# Patient Record
Sex: Female | Born: 1950 | Race: White | Hispanic: No | Marital: Single
Health system: Southern US, Community
[De-identification: ages and names within clinical notes are randomized; demographics above are authoritative.]

## PROBLEM LIST (undated history)

## (undated) DIAGNOSIS — F32A Depression, unspecified: Secondary | ICD-10-CM

## (undated) DIAGNOSIS — Z9889 Other specified postprocedural states: Secondary | ICD-10-CM

## (undated) DIAGNOSIS — Z86718 Personal history of other venous thrombosis and embolism: Secondary | ICD-10-CM

## (undated) DIAGNOSIS — T8859XA Other complications of anesthesia, initial encounter: Secondary | ICD-10-CM

## (undated) DIAGNOSIS — M1990 Unspecified osteoarthritis, unspecified site: Secondary | ICD-10-CM

## (undated) DIAGNOSIS — F419 Anxiety disorder, unspecified: Secondary | ICD-10-CM

## (undated) DIAGNOSIS — C4490 Unspecified malignant neoplasm of skin, unspecified: Secondary | ICD-10-CM

## (undated) DIAGNOSIS — R112 Nausea with vomiting, unspecified: Secondary | ICD-10-CM

## (undated) DIAGNOSIS — I10 Essential (primary) hypertension: Secondary | ICD-10-CM

## (undated) HISTORY — PX: TUBAL LIGATION: SHX77

## (undated) HISTORY — PX: DILATION AND CURETTAGE OF UTERUS: SHX78

## (undated) HISTORY — PX: BUNIONECTOMY: SHX129

---

## 2005-04-16 HISTORY — PX: ABDOMINAL HYSTERECTOMY: SHX81

## 2005-05-29 ENCOUNTER — Encounter (INDEPENDENT_AMBULATORY_CARE_PROVIDER_SITE_OTHER): Payer: Self-pay | Admitting: *Deleted

## 2005-05-29 ENCOUNTER — Inpatient Hospital Stay (HOSPITAL_COMMUNITY): Admission: RE | Admit: 2005-05-29 | Discharge: 2005-05-31 | Payer: Self-pay | Admitting: Obstetrics & Gynecology

## 2005-06-01 ENCOUNTER — Ambulatory Visit (HOSPITAL_COMMUNITY): Admission: RE | Admit: 2005-06-01 | Discharge: 2005-06-02 | Payer: Self-pay | Admitting: Obstetrics & Gynecology

## 2005-06-01 ENCOUNTER — Ambulatory Visit: Payer: Self-pay | Admitting: Family Medicine

## 2005-06-04 ENCOUNTER — Ambulatory Visit: Payer: Self-pay | Admitting: Hematology & Oncology

## 2005-07-25 ENCOUNTER — Ambulatory Visit: Payer: Self-pay | Admitting: Hematology & Oncology

## 2005-07-26 LAB — PROTIME-INR

## 2005-08-09 LAB — PROTIME-INR
INR: 1.7 — ABNORMAL LOW (ref 2.00–3.50)
Protime: 16.2 Seconds — ABNORMAL HIGH (ref 10.6–13.4)

## 2005-09-18 ENCOUNTER — Ambulatory Visit: Payer: Self-pay | Admitting: Hematology & Oncology

## 2005-09-20 LAB — PROTIME-INR: INR: 1.7 — ABNORMAL LOW (ref 2.00–3.50)

## 2005-10-04 LAB — PROTIME-INR
INR: 1.9 — ABNORMAL LOW (ref 2.00–3.50)
Protime: 16.8 Seconds — ABNORMAL HIGH (ref 10.6–13.4)

## 2005-12-18 ENCOUNTER — Ambulatory Visit: Payer: Self-pay | Admitting: Hematology & Oncology

## 2007-04-17 HISTORY — PX: OTHER SURGICAL HISTORY: SHX169

## 2007-04-17 HISTORY — PX: THROMBECTOMY: PRO61

## 2008-01-26 ENCOUNTER — Emergency Department (HOSPITAL_COMMUNITY): Admission: EM | Admit: 2008-01-26 | Discharge: 2008-01-26 | Payer: Self-pay | Admitting: Emergency Medicine

## 2008-02-02 ENCOUNTER — Ambulatory Visit: Payer: Self-pay | Admitting: Hematology & Oncology

## 2008-02-03 LAB — PROTIME-INR (CHCC SATELLITE): Protime: 15.6 Seconds — ABNORMAL HIGH (ref 10.6–13.4)

## 2008-02-09 ENCOUNTER — Inpatient Hospital Stay (HOSPITAL_COMMUNITY): Admission: EM | Admit: 2008-02-09 | Discharge: 2008-02-22 | Payer: Self-pay | Admitting: Emergency Medicine

## 2008-02-11 ENCOUNTER — Encounter (INDEPENDENT_AMBULATORY_CARE_PROVIDER_SITE_OTHER): Payer: Self-pay | Admitting: Internal Medicine

## 2008-02-11 ENCOUNTER — Ambulatory Visit: Payer: Self-pay | Admitting: *Deleted

## 2008-02-13 ENCOUNTER — Ambulatory Visit: Payer: Self-pay | Admitting: Cardiology

## 2008-02-13 ENCOUNTER — Encounter (INDEPENDENT_AMBULATORY_CARE_PROVIDER_SITE_OTHER): Payer: Self-pay | Admitting: Internal Medicine

## 2008-02-23 LAB — PROTIME-INR (CHCC SATELLITE)

## 2008-03-01 LAB — PROTIME-INR (CHCC SATELLITE): Protime: 34.8 Seconds — ABNORMAL HIGH (ref 10.6–13.4)

## 2008-03-08 LAB — PROTIME-INR (CHCC SATELLITE): Protime: 33.6 Seconds — ABNORMAL HIGH (ref 10.6–13.4)

## 2008-03-17 ENCOUNTER — Encounter: Admission: RE | Admit: 2008-03-17 | Discharge: 2008-04-06 | Payer: Self-pay | Admitting: Orthopedic Surgery

## 2008-03-19 ENCOUNTER — Ambulatory Visit: Payer: Self-pay | Admitting: Hematology & Oncology

## 2008-03-22 LAB — PROTIME-INR (CHCC SATELLITE)
INR: 2.3 (ref 2.0–3.5)
Protime: 27.6 Seconds — ABNORMAL HIGH (ref 10.6–13.4)

## 2008-04-05 LAB — PROTIME-INR (CHCC SATELLITE): INR: 3 (ref 2.0–3.5)

## 2008-04-16 HISTORY — PX: OTHER SURGICAL HISTORY: SHX169

## 2008-04-19 LAB — CBC WITH DIFFERENTIAL (CANCER CENTER ONLY)
BASO#: 0 10*3/uL (ref 0.0–0.2)
BASO%: 0.7 % (ref 0.0–2.0)
HCT: 42.1 % (ref 34.8–46.6)
HGB: 14.5 g/dL (ref 11.6–15.9)
LYMPH#: 1.4 10*3/uL (ref 0.9–3.3)
MONO#: 0.3 10*3/uL (ref 0.1–0.9)
NEUT%: 54 % (ref 39.6–80.0)
WBC: 4 10*3/uL (ref 3.9–10.0)

## 2008-04-19 LAB — PROTIME-INR (CHCC SATELLITE): Protime: 26.4 Seconds — ABNORMAL HIGH (ref 10.6–13.4)

## 2008-05-07 ENCOUNTER — Ambulatory Visit: Payer: Self-pay | Admitting: Hematology & Oncology

## 2008-05-18 ENCOUNTER — Ambulatory Visit: Payer: Self-pay | Admitting: Diagnostic Radiology

## 2008-05-18 ENCOUNTER — Ambulatory Visit (HOSPITAL_BASED_OUTPATIENT_CLINIC_OR_DEPARTMENT_OTHER): Admission: RE | Admit: 2008-05-18 | Discharge: 2008-05-18 | Payer: Self-pay | Admitting: Family Medicine

## 2008-05-27 ENCOUNTER — Ambulatory Visit: Payer: Self-pay | Admitting: *Deleted

## 2008-06-21 LAB — PROTIME-INR (CHCC SATELLITE)
INR: 2.3 (ref 2.0–3.5)
Protime: 27.6 Seconds — ABNORMAL HIGH (ref 10.6–13.4)

## 2008-07-16 ENCOUNTER — Ambulatory Visit: Payer: Self-pay | Admitting: Hematology & Oncology

## 2008-08-23 LAB — PROTIME-INR (CHCC SATELLITE): Protime: 39.6 Seconds — ABNORMAL HIGH (ref 10.6–13.4)

## 2008-09-24 ENCOUNTER — Ambulatory Visit: Payer: Self-pay | Admitting: Hematology & Oncology

## 2008-09-27 LAB — PROTIME-INR (CHCC SATELLITE): INR: 1.8 — ABNORMAL LOW (ref 2.0–3.5)

## 2008-09-27 LAB — CBC WITH DIFFERENTIAL (CANCER CENTER ONLY)
BASO%: 0.8 % (ref 0.0–2.0)
HCT: 40.7 % (ref 34.8–46.6)
LYMPH#: 1.3 10*3/uL (ref 0.9–3.3)
MONO#: 0.2 10*3/uL (ref 0.1–0.9)
NEUT#: 1.9 10*3/uL (ref 1.5–6.5)
Platelets: 195 10*3/uL (ref 145–400)
RDW: 10.9 % (ref 10.5–14.6)
WBC: 3.6 10*3/uL — ABNORMAL LOW (ref 3.9–10.0)

## 2008-10-11 ENCOUNTER — Ambulatory Visit: Payer: Self-pay | Admitting: Hematology & Oncology

## 2008-10-11 LAB — PROTIME-INR (CHCC SATELLITE)
INR: 1.4 — ABNORMAL LOW (ref 2.0–3.5)
Protime: 16.8 Seconds — ABNORMAL HIGH (ref 10.6–13.4)

## 2008-10-15 LAB — PROTIME-INR (CHCC SATELLITE): Protime: 15.6 Seconds — ABNORMAL HIGH (ref 10.6–13.4)

## 2008-11-01 ENCOUNTER — Ambulatory Visit: Payer: Self-pay | Admitting: Hematology & Oncology

## 2008-11-15 LAB — PROTIME-INR (CHCC SATELLITE)

## 2008-11-23 ENCOUNTER — Ambulatory Visit (HOSPITAL_COMMUNITY): Admission: RE | Admit: 2008-11-23 | Discharge: 2008-11-23 | Payer: Self-pay | Admitting: Orthopedic Surgery

## 2008-11-30 LAB — PROTIME-INR (CHCC SATELLITE)

## 2008-12-03 ENCOUNTER — Ambulatory Visit: Payer: Self-pay | Admitting: Hematology & Oncology

## 2008-12-06 LAB — PROTIME-INR (CHCC SATELLITE): Protime: 24 Seconds — ABNORMAL HIGH (ref 10.6–13.4)

## 2009-01-07 ENCOUNTER — Ambulatory Visit: Payer: Self-pay | Admitting: Hematology & Oncology

## 2009-01-10 LAB — CBC WITH DIFFERENTIAL (CANCER CENTER ONLY)
BASO%: 0.5 % (ref 0.0–2.0)
Eosinophils Absolute: 0.1 10*3/uL (ref 0.0–0.5)
HCT: 37.7 % (ref 34.8–46.6)
LYMPH#: 1.6 10*3/uL (ref 0.9–3.3)
MONO#: 0.3 10*3/uL (ref 0.1–0.9)
NEUT%: 54.4 % (ref 39.6–80.0)
RBC: 4.13 10*6/uL (ref 3.70–5.32)
RDW: 11.2 % (ref 10.5–14.6)
WBC: 4.3 10*3/uL (ref 3.9–10.0)

## 2009-01-10 LAB — PROTIME-INR (CHCC SATELLITE)
INR: 2.3 (ref 2.0–3.5)
Protime: 27.6 Seconds — ABNORMAL HIGH (ref 10.6–13.4)

## 2009-02-10 ENCOUNTER — Ambulatory Visit: Payer: Self-pay | Admitting: Hematology & Oncology

## 2009-02-14 LAB — CBC WITH DIFFERENTIAL (CANCER CENTER ONLY)
BASO%: 0.6 % (ref 0.0–2.0)
Eosinophils Absolute: 0.1 10*3/uL (ref 0.0–0.5)
LYMPH#: 1.5 10*3/uL (ref 0.9–3.3)
LYMPH%: 37 % (ref 14.0–48.0)
MCV: 90 fL (ref 81–101)
MONO#: 0.2 10*3/uL (ref 0.1–0.9)
NEUT#: 2.3 10*3/uL (ref 1.5–6.5)
Platelets: 214 10*3/uL (ref 145–400)
RBC: 4.43 10*6/uL (ref 3.70–5.32)
RDW: 12.1 % (ref 10.5–14.6)
WBC: 4.1 10*3/uL (ref 3.9–10.0)

## 2009-02-24 ENCOUNTER — Encounter: Payer: Self-pay | Admitting: Hematology & Oncology

## 2009-02-24 ENCOUNTER — Ambulatory Visit (HOSPITAL_COMMUNITY): Admission: RE | Admit: 2009-02-24 | Discharge: 2009-02-24 | Payer: Self-pay | Admitting: Hematology & Oncology

## 2009-02-24 ENCOUNTER — Ambulatory Visit: Payer: Self-pay | Admitting: Surgery

## 2009-04-22 ENCOUNTER — Ambulatory Visit: Payer: Self-pay | Admitting: Hematology & Oncology

## 2009-04-25 LAB — CBC WITH DIFFERENTIAL (CANCER CENTER ONLY)
Eosinophils Absolute: 0.1 10*3/uL (ref 0.0–0.5)
HCT: 40 % (ref 34.8–46.6)
LYMPH%: 29.2 % (ref 14.0–48.0)
MCV: 92 fL (ref 81–101)
MONO#: 0.3 10*3/uL (ref 0.1–0.9)
NEUT%: 62.1 % (ref 39.6–80.0)
RBC: 4.33 10*6/uL (ref 3.70–5.32)
WBC: 4.3 10*3/uL (ref 3.9–10.0)

## 2009-05-25 ENCOUNTER — Ambulatory Visit: Payer: Self-pay | Admitting: Radiology

## 2009-05-25 ENCOUNTER — Ambulatory Visit (HOSPITAL_BASED_OUTPATIENT_CLINIC_OR_DEPARTMENT_OTHER): Admission: RE | Admit: 2009-05-25 | Discharge: 2009-05-25 | Payer: Self-pay | Admitting: Family Medicine

## 2009-12-05 IMAGING — CR DG CHEST 2V
2 series · 2 of 2 positions shown · non-contrast
Comparison: 05/28/2005

CLINICAL DATA: Shortness of breath.

CHEST - 2 VIEW

[w chest pa]
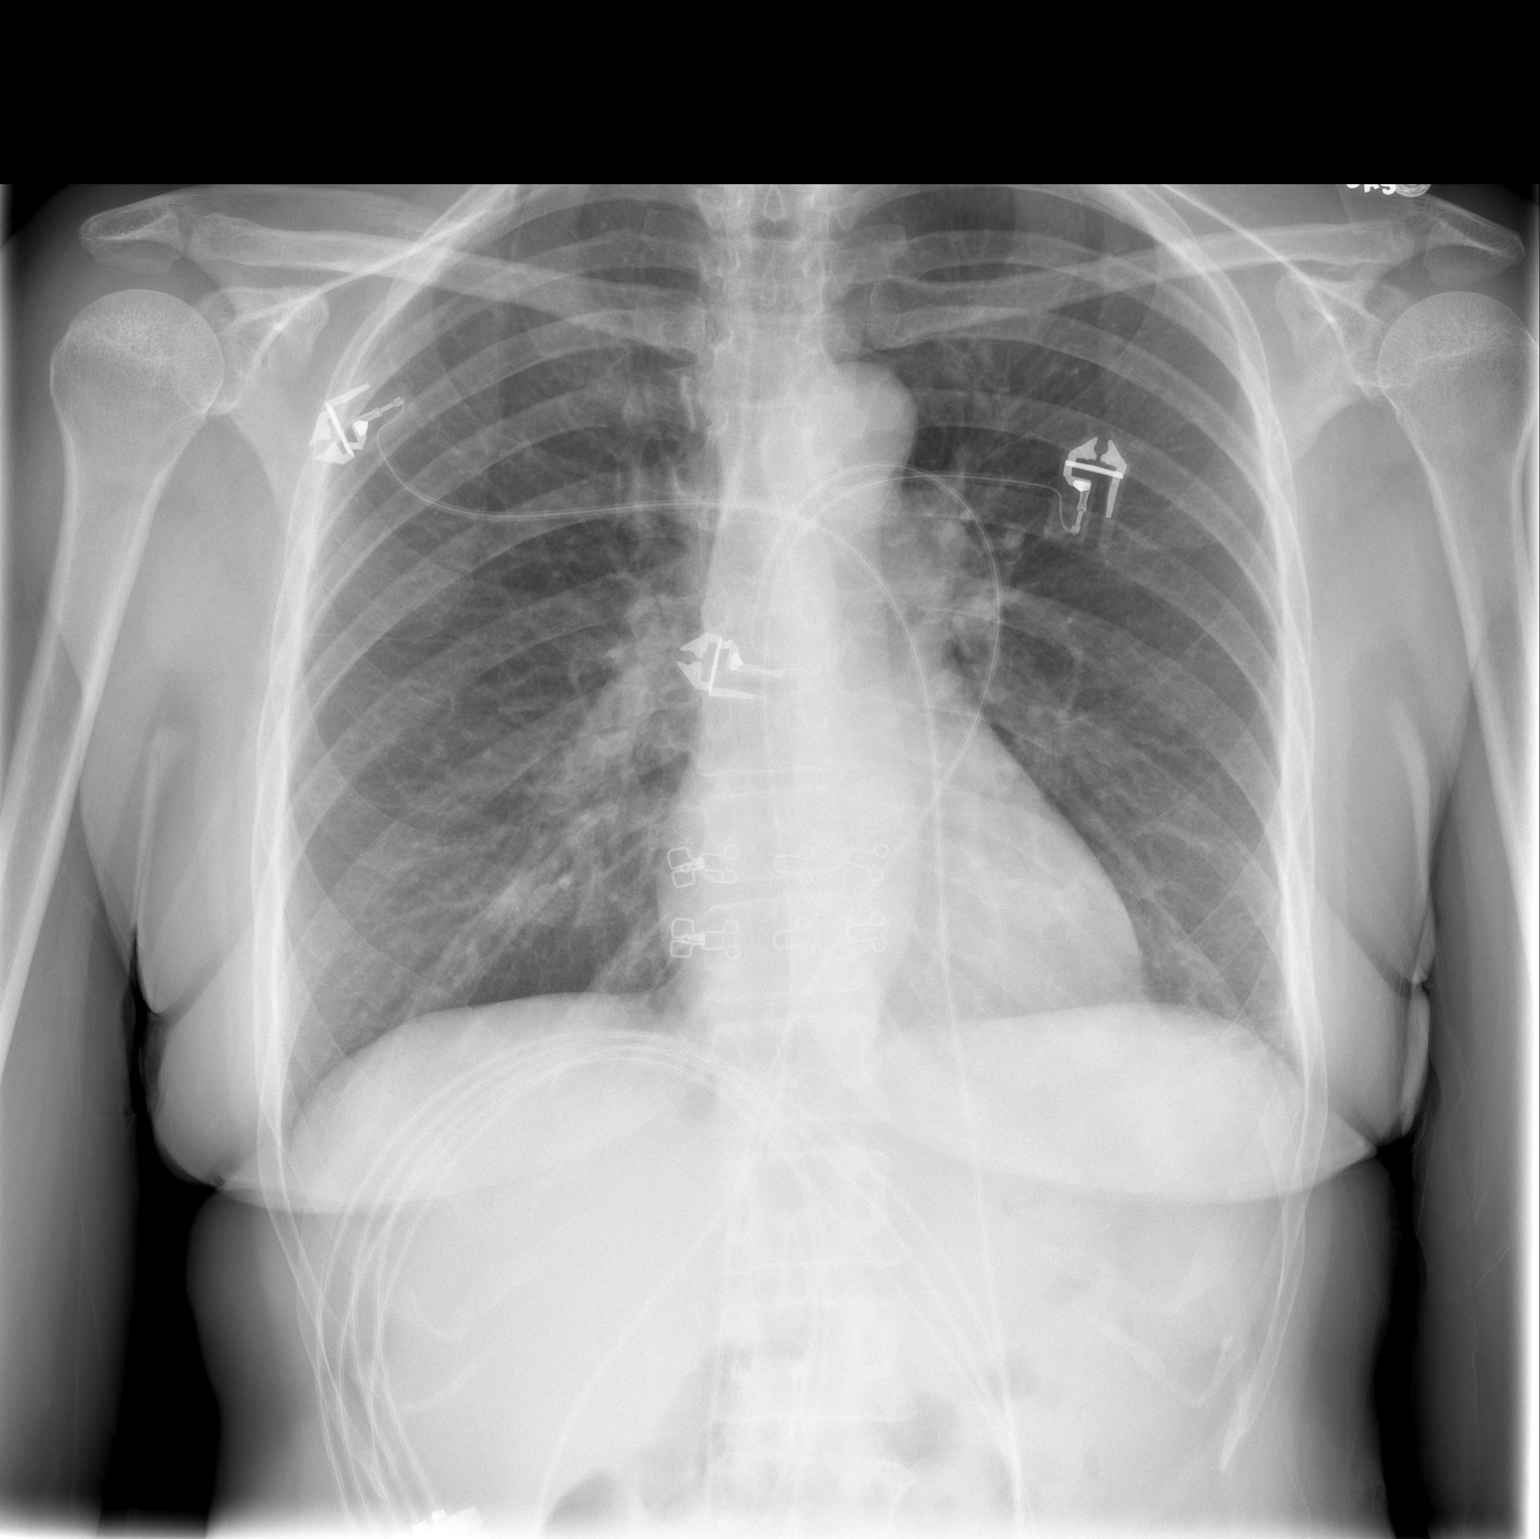

[w chest lat]
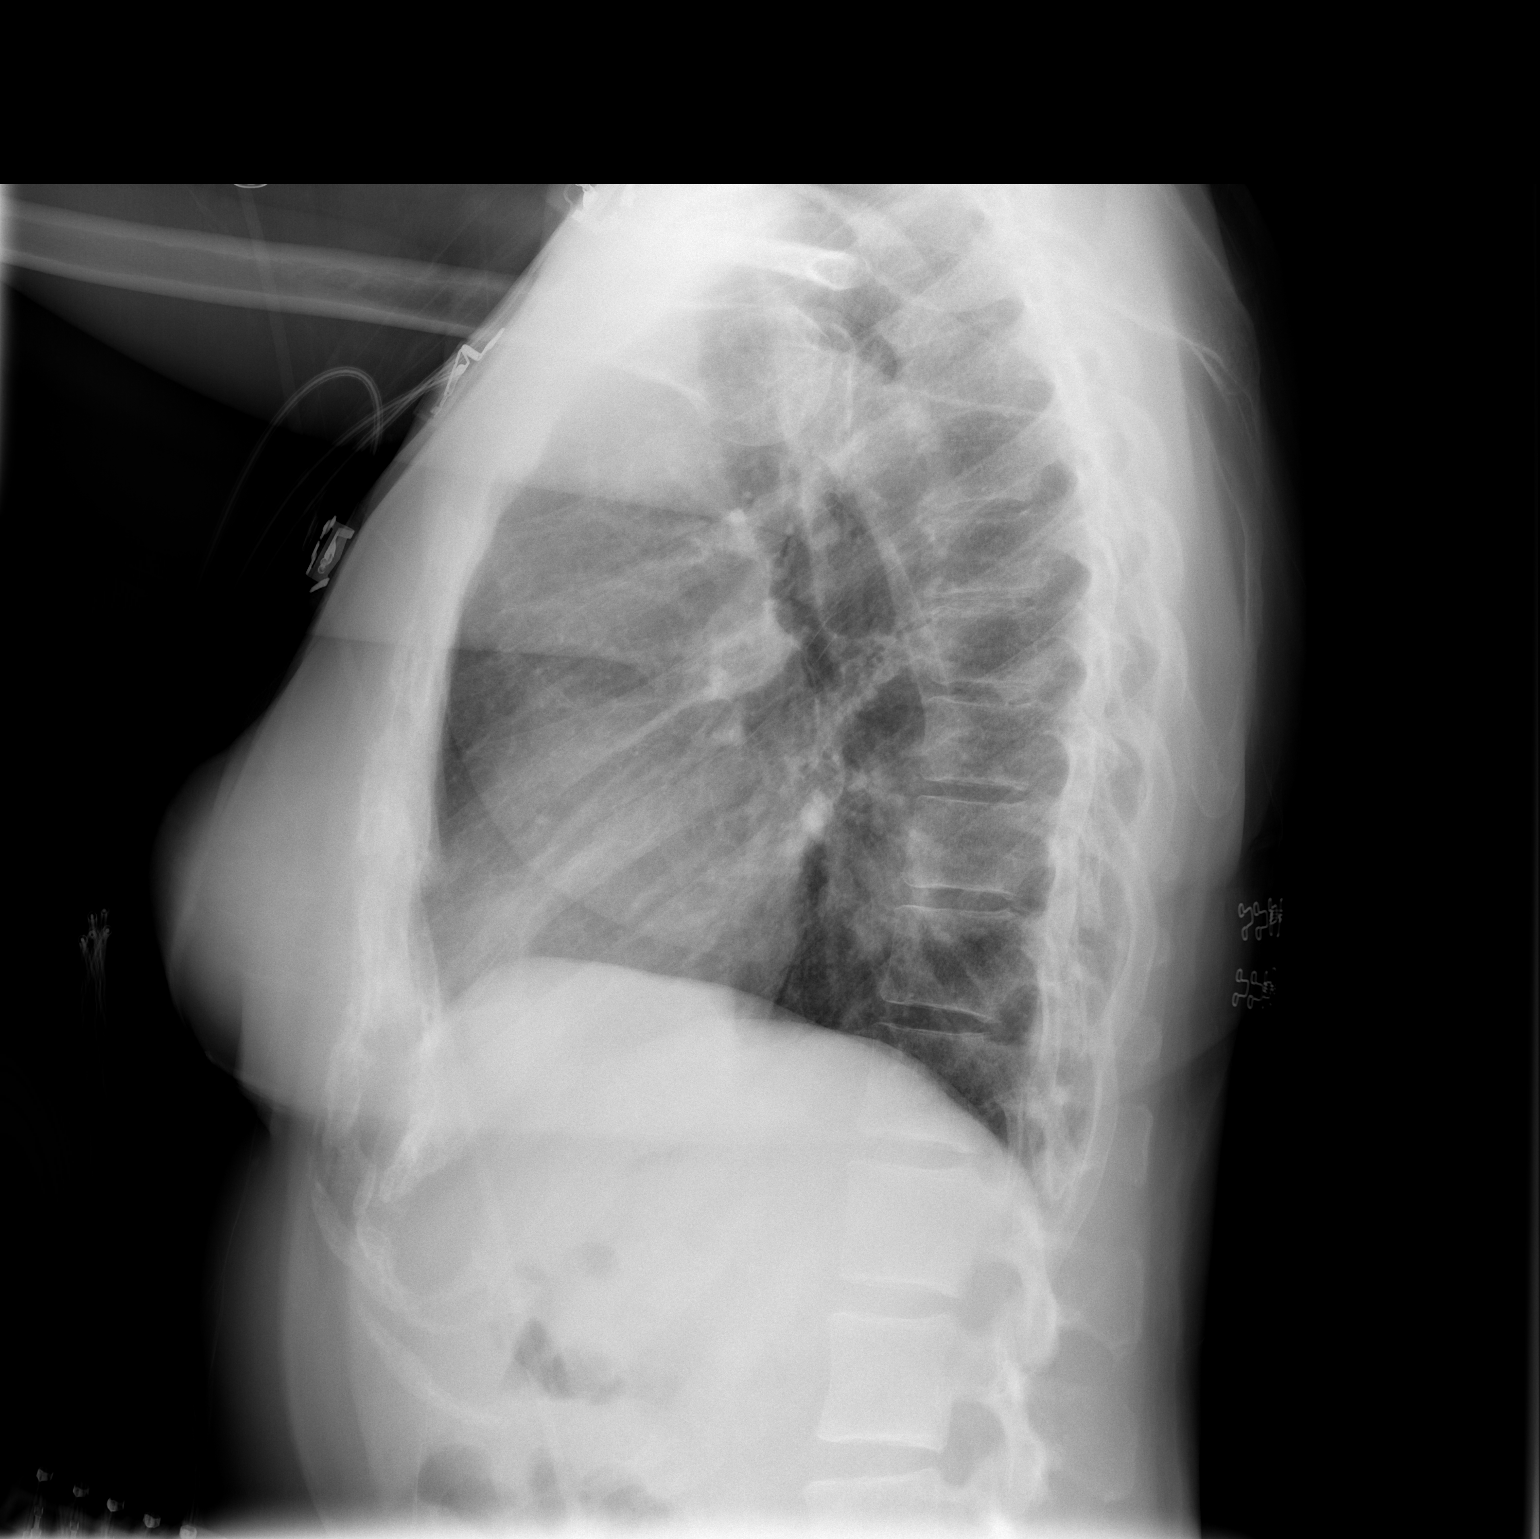

[2 of 2 positions shown; findings below may reference images not displayed]

FINDINGS: There is peribronchial thickening with slight
accentuation of the interstitial markings bilaterally without
evidence of a consolidative infiltrate or effusion.

The heart size and vascularity are normal.  There is a mild
thoracolumbar scoliosis.
IMPRESSION: Bronchitic changes.

## 2009-12-08 IMAGING — XA IR ANGIO F/U TRANSCATH/RX/INF NON THROMBOLYSIS
1 series · 12 of 24 positions shown · non-contrast
Comparison: None available

CLINICAL DATA: Progressive left lower extremity DVT on the
anticoagulation therapy.

CV CATH DECLOT W/THROMBOLYTIC INFUSION,PTA VENOUS,THROMBOECTOMY
MECHANICAL VENOUS,ULTRASOUND VENOUS ACCESS,LEFT EXTREMITY
VENOGRAPHY
TECHNIQUE: The posterior calf region was prepped with Betadine,
draped in usual sterile fashion, infiltrated locally with 1%
lidocaine. Intravenous Fentanyl and Versed were administered as
conscious sedation during continuous cardiorespiratory monitoring
by the radiology RN.
Under real time ultrasound guidance, the short saphenous vein was
accessed with a 21-gauge micropuncture needle, exchanged over a
018 guide wire for a transitional dilator, through which a Benson
wire was advanced.  The dilator was exchanged for a 6-French
vascular sheath.  Through this, a 5 French Kumpe catheter was
advanced.  Small amounts of contrast were injected intermittently
and the Kumpe catheter was advanced with aid  of the Benson wire to
the level of the external iliac vein.  The Kumpe was exchanged for
a cerebral 5-French angiographic catheter, and  with intermittent
small contrast injections and with   aid of a Benson wire the
cerebral catheter was advanced   across the confluence of the iliac
veins to the IVC.  10 mg TPA and 100 ml of normal saline were then
instilled throughout the thrombus using the AngioJet catheter on
the pulse spray setting.  After 10 minutes, the AngioJet catheter
was used for mechanical thrombolysis and thrombectomy of the clot.
Contrast injection demonstrated return of antegrade flow through
the treated segment, with residual partially occlusive thrombus.
This was further treated with the AngioJet catheter.  The treated
segment in the lower extremity was then further treated with the
aid of a   8 mm x 4 cm Conquest angioplasty balloon used to
macerate residual thrombus.  A 10 mm Conquest balloon was advanced
and used through the left iliac venous system to macerate residual
thrombus.  A focal stenosis was identified in the central aspect of
the left common iliac vein, which ultimately responded fully to 10
mm dilatation.  This improved the antegrade flow through these
segments. Through the sheath, an overnight effusion of tenecteplase
was initiated.  The patient tolerated procedure well, with no
immediate complication.

[Series 1: run · 12 of 48 slices shown]
[im 3/48]
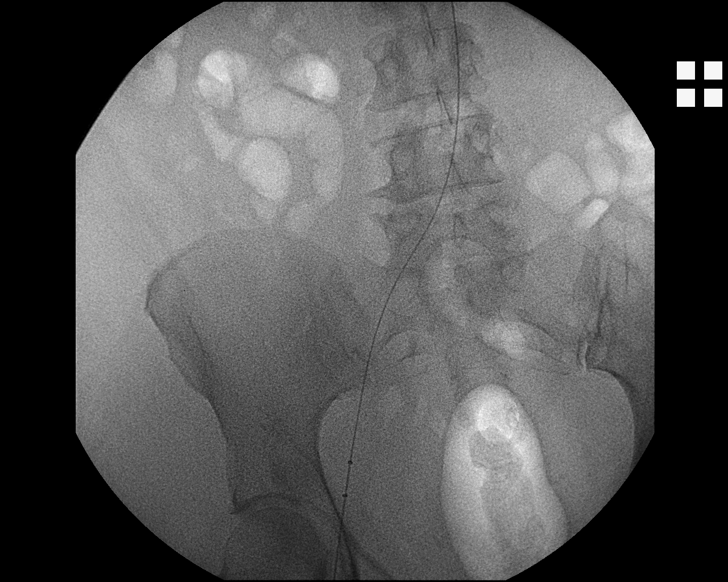
[im 7/48]
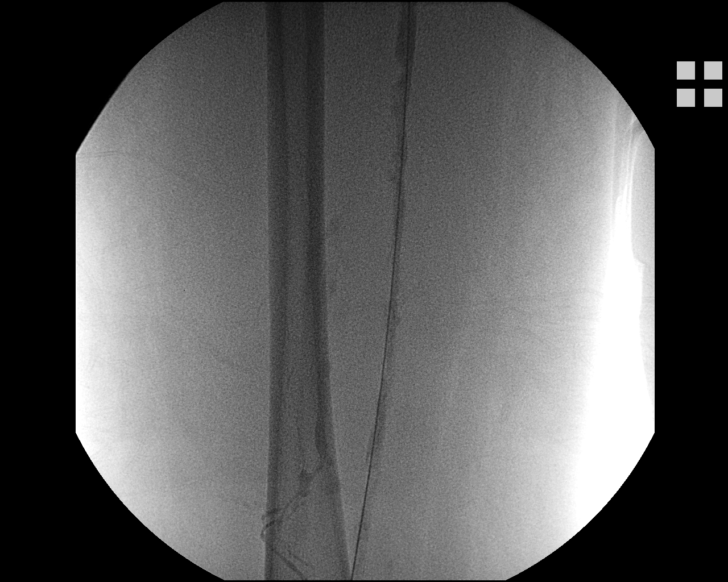
[im 11/48]
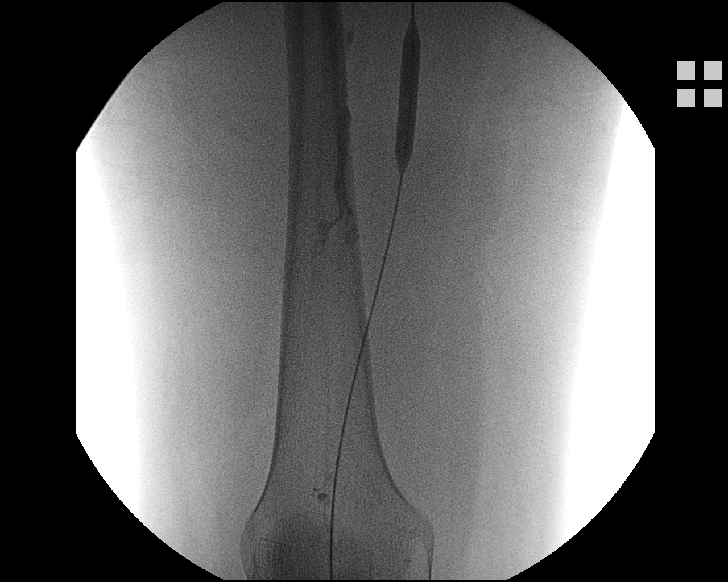
[im 15/48]
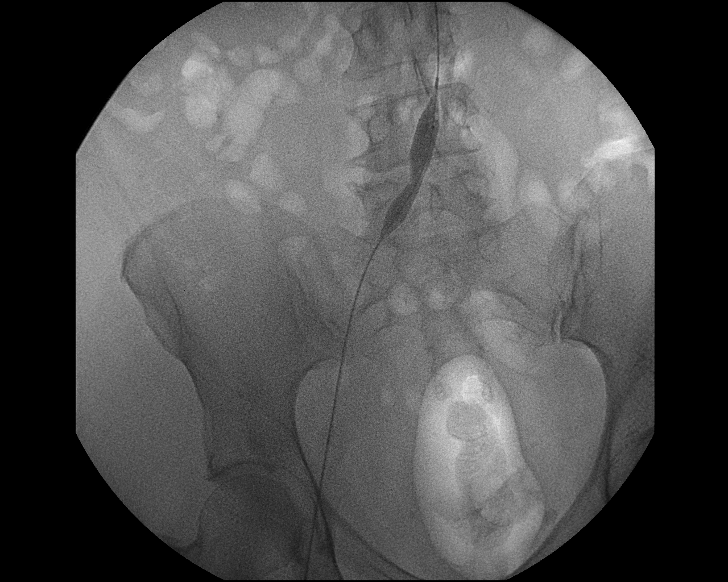
[im 19/48]
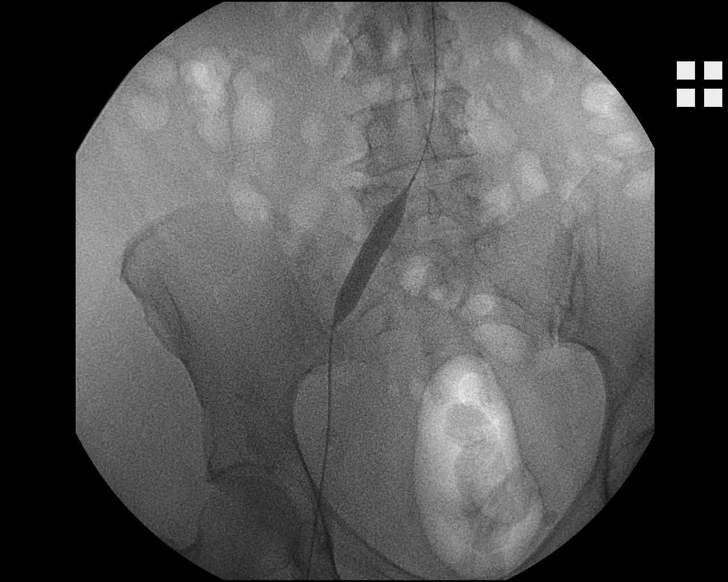
[im 23/48]
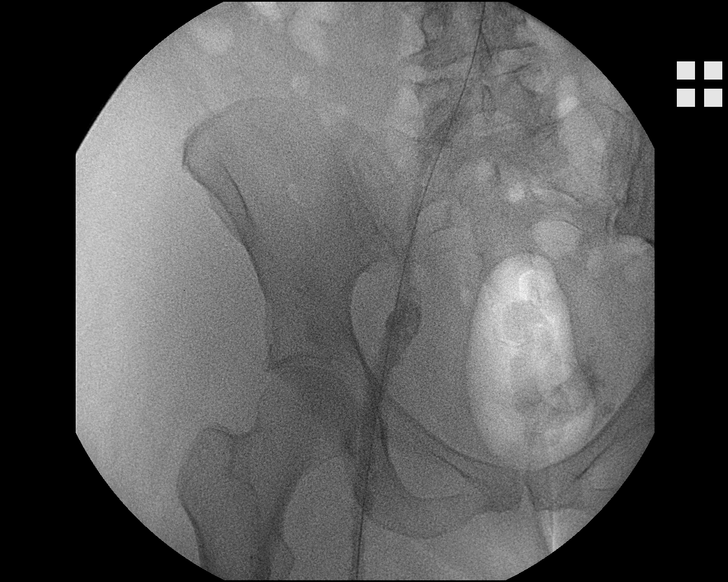
[im 27/48]
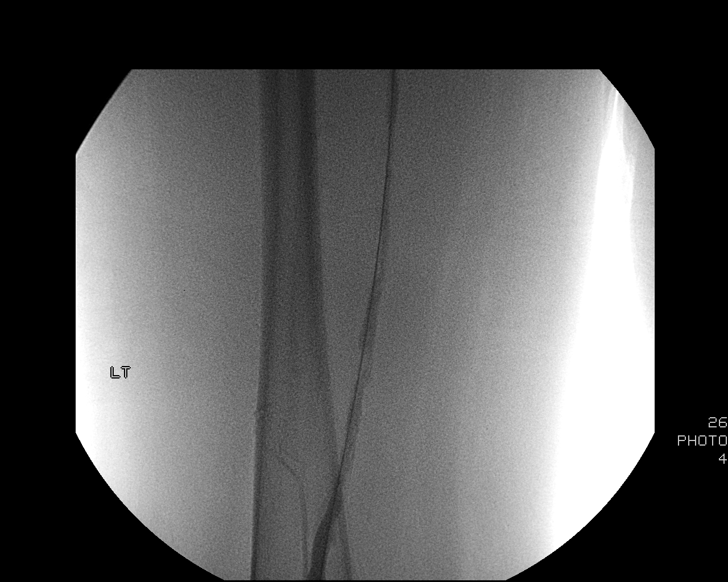
[im 31/48]
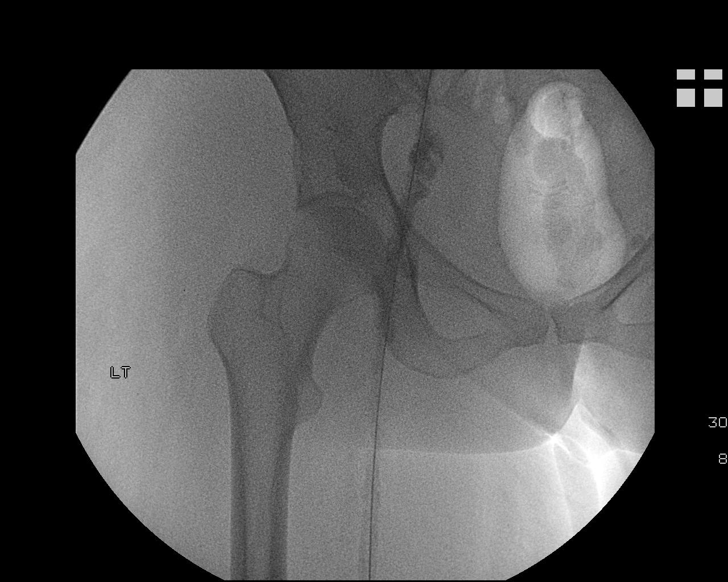
[im 35/48]
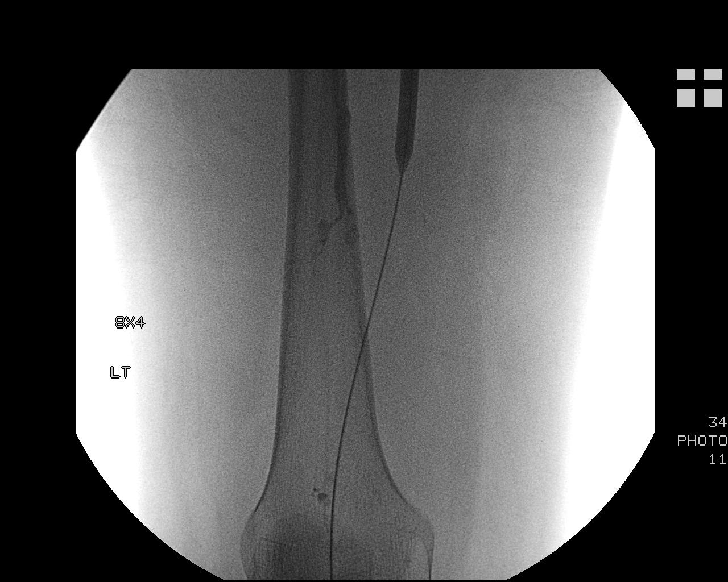
[im 39/48]
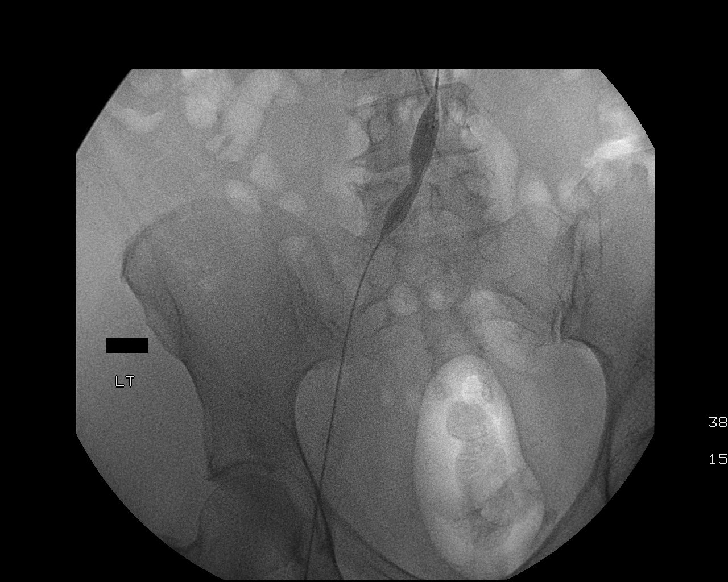
[im 43/48]
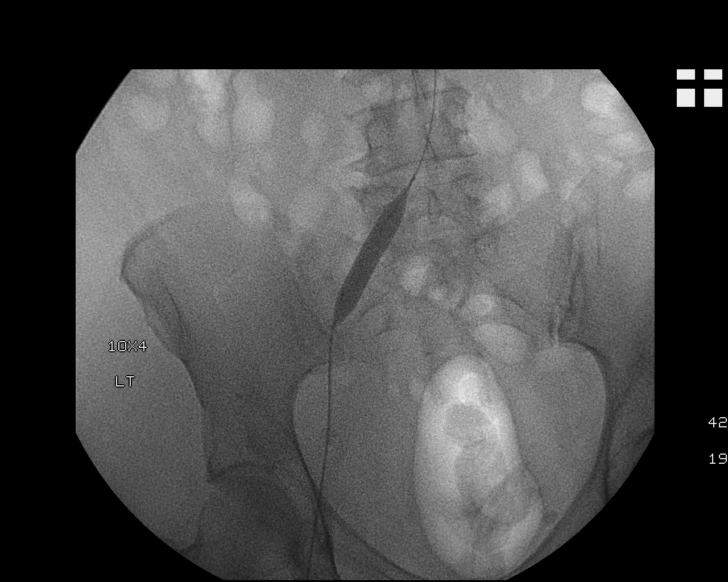
[im 48/48]
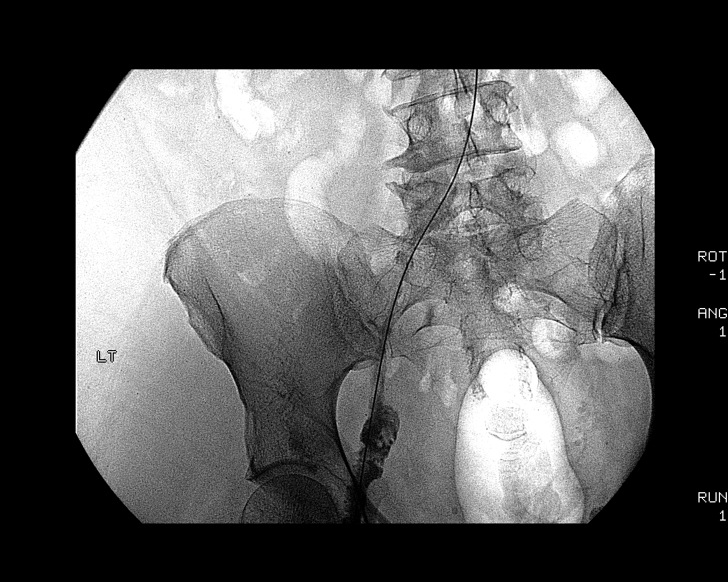

[12 of 24 positions shown; findings below may reference images not displayed]

FINDINGS: Left lower extremity venography demonstrates occlusive
thrombus from the popliteal vein through the left common iliac vein
up to the level of the confluence with the IVC.

The thrombus partially responded to pharmacologic and  mechanical
thrombolysis, and restoration of flow through this segment was
achieved although there was a moderate amount of residual thrombus.
Overnight catheter directed thrombolytic infusion was initiated
with plans for a followup venogram in the morning.
IMPRESSION: 1.  Extensive occlusive thrombus from the left popliteal vein
through the left common iliac vein to its confluence with the IVC.
2.  Stenosis in the central left common iliac vein near its
confluence with the IVC, suggesting Varagone syndrome.
3.  Technically successful pharmacologic and mechanical
thrombolysis of the DVT, with residual nonocclusive thrombus noted
in the treated segments.
4.  Overnight catheter directed thrombolytic infusion was
initiated, with plans for followup venogram in the morning.

## 2010-05-04 ENCOUNTER — Other Ambulatory Visit (HOSPITAL_BASED_OUTPATIENT_CLINIC_OR_DEPARTMENT_OTHER): Payer: Self-pay | Admitting: Family Medicine

## 2010-05-04 DIAGNOSIS — Z0000 Encounter for general adult medical examination without abnormal findings: Secondary | ICD-10-CM

## 2010-05-26 ENCOUNTER — Ambulatory Visit (HOSPITAL_BASED_OUTPATIENT_CLINIC_OR_DEPARTMENT_OTHER): Payer: Self-pay

## 2010-05-29 ENCOUNTER — Ambulatory Visit (HOSPITAL_BASED_OUTPATIENT_CLINIC_OR_DEPARTMENT_OTHER)
Admission: RE | Admit: 2010-05-29 | Discharge: 2010-05-29 | Disposition: A | Discharge status: Home / Self Care | Payer: Managed Care, Other (non HMO) | Source: Ambulatory Visit | Attending: Family Medicine | Admitting: Family Medicine

## 2010-05-29 DIAGNOSIS — Z1231 Encounter for screening mammogram for malignant neoplasm of breast: Secondary | ICD-10-CM | POA: Insufficient documentation

## 2010-05-29 DIAGNOSIS — Z0000 Encounter for general adult medical examination without abnormal findings: Secondary | ICD-10-CM

## 2010-07-22 LAB — BASIC METABOLIC PANEL
BUN: 15 mg/dL (ref 6–23)
Chloride: 104 mEq/L (ref 96–112)
Glucose, Bld: 99 mg/dL (ref 70–99)
Potassium: 3.8 mEq/L (ref 3.5–5.1)

## 2010-07-22 LAB — APTT: aPTT: 25 seconds (ref 24–37)

## 2010-07-22 LAB — PROTIME-INR: INR: 1 (ref 0.00–1.49)

## 2010-08-29 NOTE — Procedures (Signed)
DUPLEX DEEP VENOUS EXAM - LOWER EXTREMITY   INDICATION:  Right lower extremity pain.   HISTORY:  Edema:  Yes.  Trauma/Surgery:  Yes.  Pain:  Yes.  PE:  No.  Previous DVT:  No.  Anticoagulants:  Yes.  Other:   DUPLEX EXAM:                CFV   SFV   PopV  PTV    GSV                R  L  R  L  R  L  R   L  R  L  Thrombosis    o  o     o     o      o     o  Spontaneous   +  +     +     +      +     +  Phasic        +  +     +     +      +     +  Augmentation  +  +     +     +      +     +  Compressible  +  +     +     +      +     +  Competent     +  +     +     +      +     +   Legend:  + - yes  o - no  p - partial  D - decreased   IMPRESSION:  1. No sonographic evidence of deep venous thrombosis in the left lower      extremity.  2. Sonographic evidence of superficial venous thrombosis noted in the      left lesser saphenous vein from proximal to mid calf.    _____________________________  P. Liliane Bade, M.D.   AC/MEDQ  D:  05/27/2008  T:  05/27/2008  Job:  [id number]

## 2010-08-29 NOTE — Discharge Summary (Signed)
NAMESTEFANNIE, [name]              ACCOUNT NO.:  [id number]   MEDICAL RECORD NO.:  [id number]          PATIENT TYPE:  INP   LOCATION:  4714                         FACILITY:  [facility]   PHYSICIAN:  Herbie Saxon, MDDATE OF BIRTH:  02/03/1951   DATE OF ADMISSION:  02/08/2008  DATE OF DISCHARGE:  02/22/2008                               DISCHARGE SUMMARY   ADDENDUM   This is for the events between February 18, 2008 to February 22, 2008.   Discharge diagnoses will remain the same.  Note that hypokalemia has  been repleted.  Her INR was just therapeutic, but it is trending up to a  goal of 2-3.  The patient remained clinically stable.  No increased left  leg swelling or pain; however, INR was found slowly trending upwards  with increasing dosage of the Coumadin.  The patient is very anxious to  be discharged home, and she is a very complaint.  The patient promises  to follow up at [facility] with Dr. Myna Hidalgo to achieve the goal  INR of 2-3.  She is going to be discharged today on Lovenox-Coumadin  bridging.  Warfarin education extensively given to her.  repeat PT/INR  tomorrow with Dr. Myna Hidalgo.  She is to follow up with her primary care  physician, Dr. Latricia Heft, in the next 5 to 7 days, follow up with Dr.  Myna Hidalgo in the office tomorrow for the repeat PT/INR at  Hematology/Oncology.   MEDICATIONS ON DISCHARGE:  1. Lovenox 120 mg subcu daily for the next 3 days.  2. Coumadin 7.5 mg daily for the next 3 days and then reduce the      Coumadin dose to 5 mg daily subsequently.  The primary care      physician and hematologist to titrate the dose as an outpatient to      reach the goal of 2-3.  3. Nu-Iron 150 mg b.i.d.  4. Maxzide 37.5/25 one tablet daily, hold the blood pressure      medication if blood pressure is less than 100/60.  5. Tylenol [phone] mg q.6 h. as needed.  6. Multivitamin 1 tablet daily.   PHYSICAL EXAMINATION:  GENERAL:  On examination today, she is  middle  aged lady, not in acute distress.  VITAL SIGNS:  Temperature is 97.4, pulse 74, respiratory rate is 20, and  blood pressure 108/72.  HEENT:  Pupils equal and reactive to light and accommodation.  Head is  atraumatic and normocephalic.  Mucous membranes are moist.  Oropharynx  and nasopharynx are clear.  NECK:  Supple.  No elevated JVD.  CHEST:  Clinically clear.  HEART:  Heart sounds 1 and 2 regular rate and rhythm.  No gallops or  thrills.  ABDOMEN:  Benign.  CNS:  No neurologic deficits.  Peripheral pulses present.  No calf  tenderness.  Homans sign is negative.  No erythema.  No skin rash or  joint swelling.   LABORATORY DATA:  WBC is 4, hematocrit 39, and platelet count 359.  PT  19.7 and INR is 1.6.  The homocysteine level is 14.4.  Chemistry:  Potassium is 3.6, sodium is 145, chloride 110, bicarbonate 28, glucose  96, BUN 5, and creatinine 0.8.   The patient's discharge plan was explained to her and she verbalizes  understanding.      Herbie Saxon, MD  Electronically Signed     MIO/MEDQ  D:  02/22/2008  T:  02/22/2008  Job:  [id number]

## 2010-08-29 NOTE — H&P (Signed)
[name], [name] NO.:  [id number]   MEDICAL RECORD NO.:  [id number]          PATIENT TYPE:  INP   LOCATION:  4735                         FACILITY:  [facility]   PHYSICIAN:  Della Goo, M.D. DATE OF BIRTH:  1950-06-20   DATE OF ADMISSION:  02/09/2008  DATE OF DISCHARGE:                              HISTORY & PHYSICAL   PRIMARY CARE PHYSICIAN:  Dr. Riley Nearing.   HEMATOLOGIST:  Dr. Myna Hidalgo.   CHIEF COMPLAINT:  Worsening swelling left lower leg.   HISTORY OF PRESENT ILLNESS:  This 60 year old female who presents to the  emergency department at [facility] for evaluation secondary to  worsening swelling of her left leg and a history of a recent deep venous  thrombosis in that leg which was diagnosed January 26, 2008.  The  patient reports being started on therapy in the hospital being treated  with heparin and Coumadin and was being monitored weekly with Coumadin  levels.  She reports taking her medications as directed.  She reports  last Tuesday her INR was 1.2.  She denies having any shortness of breath  or chest pain.  Denies having any nausea, vomiting, diarrhea.  Past  medical history as mentioned above.  Also history of hypertension.   MEDICATIONS:  Maxzide 37.5/25 one p.o. daily, multivitamin one p.o.  daily Coumadin 5 mg p.o. daily Tylenol 1000 mg p.o. q.6 h p.r.n. pain.   PAST SURGICAL HISTORY:  History of a total abdominal hysterectomy with a  DVT in February 2007.  Also, history of D&C, and history of a  bunionectomy.   ALLERGIES:  SULFA.   SOCIAL HISTORY:  The patient is a nonsmoker, nondrinker.   FAMILY HISTORY:  Noncontributory.   PHYSICAL EXAMINATION FINDINGS:  GENERAL:  This is a 60 year old well-  nourished, well-developed female in discomfort, but no acute distress.  VITAL SIGNS:  Temperature 97.9, blood pressure 109/73, heart rate 61,  respirations 18, O2 sats 99%.  HEENT:  Examination normocephalic, atraumatic.  Pupils equally  round  reactive to light.  Extraocular movements are intact.  Funduscopic  benign.  Oropharynx is clear.  NECK:  Supple, full range of motion.  No thyromegaly, adenopathy,  jugulovenous distention.  CARDIOVASCULAR:  Regular rate and rhythm.  No murmurs, gallops or rubs.  LUNGS: Clear to auscultation bilaterally.  ABDOMEN:  Positive bowel sounds, soft, nontender, nondistended.  EXTREMITIES:  Without cyanosis, clubbing or edema except in the left  lower leg which has 3+ edema, 2 above the knee area.  NEUROLOGIC:  Examination nonfocal.   LABORATORY STUDIES:  White blood cell count 7.7, hemoglobin 12.3,  hematocrit 36.1, platelets 218.  Protime 17.1, INR 1.3.  Sodium 132,  potassium 2.8, chloride 98, carbon dioxide 26, BUN 17, creatinine 1.11,  calcium 8.5, glucose 144.   ASSESSMENT:  A 60 year old female being admitted with  1. Left lower extremity edema/DVT.  2. Subtherapeutic Coumadin level.  3. History of hypertension.  4. Mild hyperglycemia.   PLAN:  The patient will be admitted and placed on a Lovenox bridge until  her Coumadin level begins to become therapeutic.  The patient  will be  monitored daily with a PT and INR.  The patient's regular medications  will also be continued.  The patient has been advised not to use as much  Tylenol secondary to possible hepatotoxicity.  Pain control therapy has  been ordered.  Further evaluation and therapies will ensue pending  results of the patient's clinical course.      Della Goo, M.D.  Electronically Signed     HJ/MEDQ  D:  02/09/2008  T:  02/10/2008  Job:  [id number]

## 2010-08-29 NOTE — Op Note (Signed)
[name], [name]              ACCOUNT NO.:  [id number]   MEDICAL RECORD NO.:  [id number]          PATIENT TYPE:  OIB   LOCATION:  1611                         FACILITY:  [facility]   PHYSICIAN:  Marlowe Kays, M.D.  DATE OF BIRTH:  06/20/1950   DATE OF PROCEDURE:  11/23/2008  DATE OF DISCHARGE:                               OPERATIVE REPORT   PREOPERATIVE DIAGNOSES:  1. Rotator cuff tendinopathy secondary to impingement.  2. Rotator cuff impingement secondary to hypertrophic      acromioclavicular joint.   POSTOPERATIVE DIAGNOSES:  1. Rotator cuff tendinopathy secondary to impingement.  2. Rotator cuff impingement secondary to hypertrophic      acromioclavicular joint.   OPERATION:  1. Right shoulder arthroscopy (normal glenohumeral examination).  2. Arthroscopic subacromial decompression.  3. Extensive decompression of hypermobile distal clavicle.   ASSISTANT:  Mr. Idolina Primer, [facility]   ANESTHESIA:  General.   JUSTIFICATION FOR PROCEDURE:  She had an on the job right shoulder  injury which, because of pain, led to an MRI demonstrating some widening  of the [facility] joint.  She had never had any previous injury, some bursal  surface fraying of the rotator cuff and impingement type anatomic  predisposition of the distal clavicle and the distal acromion.  Because  of failure to improve she is here today for the above-mentioned surgical  procedure.  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  She will be placed back on Lovenox and Coumadin  postoperatively.   PROCEDURE:  Satisfactory general anesthesia, beach-chair position on the  Wrens frame.  The right shoulder girdle was prepped with DuraPrep and  draped in a sterile field.  The anatomy of the shoulder joint was marked  out and after performing time-out, I injected the proposed lateral and  posterior portals in the subacromial space  with 0.5% Marcaine with  adrenaline.  Through a posterior soft spot portal I atraumatically  entered the glenohumeral joint which was normal on inspection and  representative pictures were taken.  I then redirected the scope into  the subacromial space and through a lateral portal introduced a 4.2  shaver.  She did not have a lot of bursitis present bur did have a good  bit of surface abrasion of the rotator cuff particularly at the [facility] joint  which was very hyper mobile and with abduction of the arm the distal  clavicle would dip downward into the rotator cuff.  After using the 90  degree ArthroCare vaporizer to remove soft tissue from the undersurface  of the acromion and AC joint, I then used the 4-mm oval bur to began  decompressing these areas.  Particularly the distal clavicle required a  significant amount of decompression and I kept working until on  abduction of the arm there was adequate space with no impingement of the  undersurface of the clavicle into the rotator cuff.  Final documentary  pictures were taken.  All fluid possible  was removed from the subacromial space.  I then reinjected it and the  two portals with 0.5% Marcaine with adrenaline and closed the two  portals with 4-0 nylon.  Betadine, Adaptic, dry sterile dressing, and  shoulder immobilizer applied.  She tolerated the procedure well with no  known complications and minimal blood loss.           ______________________________  Marlowe Kays, M.D.     JA/MEDQ  D:  11/23/2008  T:  11/23/2008  Job:  [id number]

## 2010-08-29 NOTE — Assessment & Plan Note (Signed)
OFFICE VISIT   SIGNE, TACKITT  DOB:  Sep 01, 1950                                       05/27/2008  UEAVW#:[id number]   The patient is a 60 year old female who was admitted to [facility] late October with extensive DVT in the left lower extremity.  She underwent interventional treatment with lysis and successful  recanalization of the deep veins of the left lower extremity.  She has  had a marked improvement in her symptoms related to this.  Minimal  residual swelling.  Remains on Coumadin.  This was her second DVT  episode, her initial one occurring following a hysterectomy.   Venous duplex evaluation reveals no evidence of deep venous thrombosis  of the left lower extremity.  She does have some superficial thrombus  noted in the last lesser saphenous vein in the left leg.   Evaluation reveals a well-appearing 60 year old female.  BP 129/89,  pulse 79 per minute.  The lower extremities reveal superficial  varicosities.  No ulceration.  2+ posterior tibial and dorsalis pedis  pulses bilaterally.  1+ edema of the left ankle.   The patient has gotten an excellent result from this interventional  treatment for her extensive DVT.  Dr. Myna Hidalgo is following her Coumadin  and I am in agreement that she needs to be on this for a minimum of 2  years.  Any further workup regarding hypercoagulable state will be left  in Dr. Gustavo Lah capable hands.   Balinda Quails, M.D.  Electronically Signed   PGH/MEDQ  D:  05/27/2008  T:  05/28/2008  Job:  [id number]   cc:   Rose Phi. Myna Hidalgo, M.D.  Carilyn Goodpasture

## 2010-08-29 NOTE — Group Therapy Note (Signed)
[name], [name] NO.:  [id number]   MEDICAL RECORD NO.:  [id number]          PATIENT TYPE:  INP   LOCATION:  4714                         FACILITY:  [facility]   PHYSICIAN:  Hillery Aldo, M.D.   DATE OF BIRTH:  Apr 20, 1950                                 PROGRESS NOTE   PRIMARY CARE PHYSICIAN:  Dr. Riley Nearing.   DISCHARGE DIAGNOSES:  1. Extensive left lower extremity deep venous thrombosis status post      thrombectomy and treatment with tenecteplase now on IV heparin and      Coumadin bridge.  2. Normocytic anemia.  3. Hypertension.  4. Hypokalemia.  5. Transient fever felt to be secondary to number 1.   DISCHARGE MEDICATIONS:  Will be dictated at time of actual discharge.   CONSULTATIONS:  1. P. Liliane Bade, M.D. of vascular surgery  2. D. Oley Balm, M.D. of Interventional Radiology.   BRIEF ADMISSION HISTORY OF PRESENT ILLNESS:  The patient is a 60 year old female who presented to the emergency  department secondary to worsening swelling of her left leg and recent  diagnosis of deep venous thrombosis.  The patient was being treated with  Coumadin and reported that her INR was subtherapeutic on her last check.  She was found to have extensive lower extremity swelling and therefore  was admitted for further evaluation and workup.  For full details,  please see the dictated report done by Dr. Lovell Sheehan.   PROCEDURE/DIAGNOSTIC STUDIES:  1. Chest x-ray on February 10, 2008 showed bronchitic changes.  2. Catheter installation of thrombolytic treatment by interventional      radiology with venous ultrasound of the left lower extremity and      venography done on February 13, 2008.  3. Left lower extremity venogram on February 14, 2008 showed a      technically successful catheter directed thrombolysis of the left      lower extremity acute DVT.  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   HOSPITAL COURSE:  1. Extensive left lower extremity deep venous thrombosis:  The patient      was admitted and put on intravenous heparin.  Coumadin was held and      consultation was made with the vascular surgeon, Dr. Madilyn Fireman.  The      patient underwent further diagnostic studies and was found to have      a DVT noted in the proximal common femoral vein, superficial      femoral vein, proximal femoral vein, popliteal and gastrocnemius.      Flow was noted in the  PTV and one branch of the peroneal vein.  Superficial thrombus was noted  in the greater saphenous vein in the mid distal thigh.  Arterial studies  showed triphasic flow noted in the common femoral artery, femoral artery  and popliteal artery as well as the ATA.  Biphasic flow was in the  PFA  and PTA.  It was felt the patient would need a thrombectomy and did  undergo this on February 13, 2008.  She subsequently underwent  tenecteplase  IV infusion and was followed closely by the vascular  surgery department.  At this time, the patient's swelling has  dramatically improved and she remains on IV heparin therapy with  Coumadin bridge.  We are waiting for her Coumadin levels to become  therapeutic for 48 hours at which time heparin will be discontinued and  she will be discharged home.  1. Normocytic anemia:  The patient's hemoglobin trended down to a low      value of 8.6.  Her anemia was felt to be due to some acute losses      with hematuria, extensive clot and possibly some underlying chronic      issues.  Iron supplementation therapy was started.  She should be      monitored closely in the outpatient setting for return of her blood      counts to normal.  2. Hypertension:  The patient was maintained on her usual outpatient      therapies.  We continue to monitor her blood pressure and we will      adjust her medications if  needed.  3. Hypokalemia:  The patient was appropriately repleted.  4. Transient fever:  The patient's fever was felt to be due to her      acute clot.  It resolved without any recurrence during her hospital      stay.   DISPOSITION:  The patient will be discharged when her INR is therapeutic for 48 hours.  Discharge summary addendum will be dictated at that time.      Hillery Aldo, M.D.  Electronically Signed     CR/MEDQ  D:  02/17/2008  T:  02/17/2008  Job:  [id number]   cc:   Rose Phi. Myna Hidalgo, M.D.  Fax: [phone]   Riley Nearing, MD

## 2011-01-15 LAB — CULTURE, BLOOD (ROUTINE X 2)
Culture: NO GROWTH
Culture: NO GROWTH
Culture: NO GROWTH

## 2011-01-15 LAB — BASIC METABOLIC PANEL
BUN: 12
BUN: 17
BUN: 6
CO2: 24
Calcium: 8.1 — ABNORMAL LOW
Calcium: 8.1 — ABNORMAL LOW
Chloride: 106
Chloride: 110
Chloride: 98
Creatinine, Ser: 0.82
Creatinine, Ser: 0.87
GFR calc Af Amer: >60
GFR calc Af Amer: >60
GFR calc non Af Amer: >60
GFR calc non Af Amer: >60
Glucose, Bld: 106 — ABNORMAL HIGH
Glucose, Bld: 144 — ABNORMAL HIGH
Potassium: 2.8 — ABNORMAL LOW

## 2011-01-15 LAB — CBC
HCT: 36.1
Hemoglobin: 12.3
Hemoglobin: 9.4 — ABNORMAL LOW
MCHC: 34.9
MCHC: 35.6
MCV: 91
MCV: 91.4
MCV: 92.8
MCV: 93
Platelets: 157
Platelets: 168
Platelets: 168
Platelets: 179
Platelets: 197
Platelets: 218
RBC: 2.96 — ABNORMAL LOW
RBC: 3.46 — ABNORMAL LOW
RDW: 12.1
RDW: 12.2
RDW: 12.3
WBC: 5.1
WBC: 5.4
WBC: 6.5
WBC: 7.7

## 2011-01-15 LAB — URINALYSIS, ROUTINE W REFLEX MICROSCOPIC
Bilirubin Urine: NEGATIVE
Ketones, ur: NEGATIVE
Nitrite: NEGATIVE
Specific Gravity, Urine: 1.01
Urobilinogen, UA: 0.2

## 2011-01-15 LAB — COMPREHENSIVE METABOLIC PANEL
ALT: 16
AST: 16
AST: 22
Albumin: 2.3 — ABNORMAL LOW
Albumin: 2.5 — ABNORMAL LOW
Alkaline Phosphatase: 71
Chloride: 111
Chloride: 112
Creatinine, Ser: 0.74
Creatinine, Ser: 0.78
GFR calc Af Amer: >60
GFR calc Af Amer: >60
Potassium: 3.5
Potassium: 4
Sodium: 141
Total Bilirubin: 0.6
Total Bilirubin: 0.9
Total Protein: 5 — ABNORMAL LOW

## 2011-01-15 LAB — APTT
aPTT: 136 — ABNORMAL HIGH
aPTT: 53 — ABNORMAL HIGH

## 2011-01-15 LAB — PROTIME-INR
INR: 1.6 — ABNORMAL HIGH
INR: 1.9 — ABNORMAL HIGH
INR: 2.3 — ABNORMAL HIGH
Prothrombin Time: 19.3 — ABNORMAL HIGH
Prothrombin Time: 22.2 — ABNORMAL HIGH
Prothrombin Time: 25.9 — ABNORMAL HIGH
Prothrombin Time: 26.9 — ABNORMAL HIGH

## 2011-01-15 LAB — CROSSMATCH: Antibody Screen: NEGATIVE

## 2011-01-15 LAB — FIBRINOGEN: Fibrinogen: 523 — ABNORMAL HIGH

## 2011-01-15 LAB — URINE CULTURE
Colony Count: NO GROWTH
Culture: NO GROWTH

## 2011-01-15 LAB — ABO/RH: ABO/RH(D): A POS

## 2011-01-15 LAB — GLUCOSE, CAPILLARY: Glucose-Capillary: 87

## 2011-01-15 LAB — HEPARIN LEVEL (UNFRACTIONATED): Heparin Unfractionated: 0.27 — ABNORMAL LOW

## 2011-01-15 LAB — URINE MICROSCOPIC-ADD ON

## 2011-01-16 LAB — CBC
HCT: 24.8 — ABNORMAL LOW
HCT: 25.9 — ABNORMAL LOW
HCT: 29.4 — ABNORMAL LOW
HCT: 36.6
HCT: 39.1
Hemoglobin: 8.9 — ABNORMAL LOW
MCHC: 34.1
MCHC: 34.7
MCHC: 35
MCV: 91.5
MCV: 92.4
MCV: 93
Platelets: 247
Platelets: 261
Platelets: 275
Platelets: 302
Platelets: 359
RBC: 2.71 — ABNORMAL LOW
RBC: 2.8 — ABNORMAL LOW
RBC: 3.03 — ABNORMAL LOW
RDW: 12.2
RDW: 12.8
RDW: 13.1
RDW: 13.2
RDW: 13.6
WBC: 3.4 — ABNORMAL LOW
WBC: 3.5 — ABNORMAL LOW
WBC: 3.9 — ABNORMAL LOW
WBC: 4
WBC: 4.7

## 2011-01-16 LAB — BASIC METABOLIC PANEL
BUN: 5 — ABNORMAL LOW
CO2: 26
CO2: 28
Calcium: 8.5
Chloride: 109
Chloride: 110
Chloride: 111
Creatinine, Ser: 0.85
Creatinine, Ser: 0.87
GFR calc Af Amer: >60
GFR calc Af Amer: >60
GFR calc Af Amer: >60
GFR calc non Af Amer: >60
GFR calc non Af Amer: >60
Glucose, Bld: 101 — ABNORMAL HIGH
Potassium: 3.2 — ABNORMAL LOW
Potassium: 3.5
Sodium: 140
Sodium: 141
Sodium: 144

## 2011-01-16 LAB — ANTITHROMBIN III: AntiThromb III Func: 121 — ABNORMAL HIGH (ref 76–126)

## 2011-01-16 LAB — PROTIME-INR
INR: 1.2
INR: 1.3
INR: 1.3
INR: 1.3
INR: 1.4
INR: 1.8 — ABNORMAL HIGH
INR: 2.3 — ABNORMAL HIGH
Prothrombin Time: 15.8 — ABNORMAL HIGH
Prothrombin Time: 16.7 — ABNORMAL HIGH
Prothrombin Time: 17 — ABNORMAL HIGH
Prothrombin Time: 17.1 — ABNORMAL HIGH
Prothrombin Time: 17.7 — ABNORMAL HIGH
Prothrombin Time: 26.4 — ABNORMAL HIGH

## 2011-01-16 LAB — BETA-2-GLYCOPROTEIN I ABS, IGG/M/A
Beta-2-Glycoprotein I IgA: 6 U/mL (ref <10)
Beta-2-Glycoprotein I IgM: <4 U/mL (ref <10)

## 2011-01-16 LAB — FACTOR 5 LEIDEN

## 2011-01-16 LAB — HEPARIN LEVEL (UNFRACTIONATED)
Heparin Unfractionated: 0.47
Heparin Unfractionated: 0.55
Heparin Unfractionated: 0.62
Heparin Unfractionated: 1.08 — ABNORMAL HIGH

## 2011-01-16 LAB — POTASSIUM: Potassium: 3.6

## 2011-01-16 LAB — LUPUS ANTICOAGULANT PANEL
PTTLA Confirmation: 1.4 (ref <8.0)
dRVVT Incubated 1:1 Mix: 39.5 (ref 36.1–47.0)

## 2011-01-16 LAB — PROTEIN S, TOTAL: Protein S Ag, Total: 108 % (ref 70–140)

## 2011-01-16 LAB — PROTEIN C, TOTAL: Protein C, Total: 73 % (ref 70–140)

## 2011-01-16 LAB — CARDIOLIPIN ANTIBODIES, IGG, IGM, IGA: Anticardiolipin IgA: <7 — ABNORMAL LOW (ref <13)

## 2011-01-16 LAB — PROTEIN S ACTIVITY: Protein S Activity: 91 % (ref 69–129)

## 2011-05-23 ENCOUNTER — Other Ambulatory Visit (HOSPITAL_BASED_OUTPATIENT_CLINIC_OR_DEPARTMENT_OTHER): Payer: Self-pay | Admitting: Family Medicine

## 2011-05-23 DIAGNOSIS — Z1231 Encounter for screening mammogram for malignant neoplasm of breast: Secondary | ICD-10-CM

## 2011-05-31 ENCOUNTER — Ambulatory Visit (HOSPITAL_BASED_OUTPATIENT_CLINIC_OR_DEPARTMENT_OTHER)
Admission: RE | Admit: 2011-05-31 | Discharge: 2011-05-31 | Disposition: A | Discharge status: Home / Self Care | Payer: Managed Care, Other (non HMO) | Source: Ambulatory Visit | Attending: Family Medicine | Admitting: Family Medicine

## 2011-05-31 DIAGNOSIS — Z1231 Encounter for screening mammogram for malignant neoplasm of breast: Secondary | ICD-10-CM | POA: Insufficient documentation

## 2011-07-02 ENCOUNTER — Ambulatory Visit (HOSPITAL_BASED_OUTPATIENT_CLINIC_OR_DEPARTMENT_OTHER)
Admission: RE | Admit: 2011-07-02 | Discharge: 2011-07-02 | Disposition: A | Discharge status: Home / Self Care | Payer: Managed Care, Other (non HMO) | Source: Ambulatory Visit | Attending: Family Medicine | Admitting: Family Medicine

## 2011-07-02 ENCOUNTER — Other Ambulatory Visit (HOSPITAL_BASED_OUTPATIENT_CLINIC_OR_DEPARTMENT_OTHER): Payer: Self-pay | Admitting: Family Medicine

## 2011-07-02 DIAGNOSIS — M2010 Hallux valgus (acquired), unspecified foot: Secondary | ICD-10-CM

## 2011-07-02 DIAGNOSIS — M79671 Pain in right foot: Secondary | ICD-10-CM

## 2011-07-02 DIAGNOSIS — M79609 Pain in unspecified limb: Secondary | ICD-10-CM | POA: Insufficient documentation

## 2011-07-02 DIAGNOSIS — M19079 Primary osteoarthritis, unspecified ankle and foot: Secondary | ICD-10-CM

## 2012-06-16 ENCOUNTER — Other Ambulatory Visit (HOSPITAL_BASED_OUTPATIENT_CLINIC_OR_DEPARTMENT_OTHER): Payer: Self-pay | Admitting: Family Medicine

## 2012-06-16 DIAGNOSIS — Z1231 Encounter for screening mammogram for malignant neoplasm of breast: Secondary | ICD-10-CM

## 2012-06-19 ENCOUNTER — Ambulatory Visit (HOSPITAL_BASED_OUTPATIENT_CLINIC_OR_DEPARTMENT_OTHER)
Admission: RE | Admit: 2012-06-19 | Discharge: 2012-06-19 | Disposition: A | Discharge status: Home / Self Care | Payer: Managed Care, Other (non HMO) | Source: Ambulatory Visit | Attending: Family Medicine | Admitting: Family Medicine

## 2012-06-19 DIAGNOSIS — Z1231 Encounter for screening mammogram for malignant neoplasm of breast: Secondary | ICD-10-CM | POA: Insufficient documentation

## 2013-12-03 LAB — BASIC METABOLIC PANEL
BUN: 17 mg/dL (ref 4–21)
Creatinine: 0.9 mg/dL (ref 0.5–1.1)
GLUCOSE: 100 mg/dL
Potassium: 3.6 mmol/L (ref 3.4–5.3)
Sodium: 141 mmol/L (ref 137–147)

## 2013-12-15 ENCOUNTER — Other Ambulatory Visit: Payer: Self-pay | Admitting: Family Medicine

## 2013-12-17 ENCOUNTER — Other Ambulatory Visit (HOSPITAL_COMMUNITY): Payer: Self-pay | Admitting: *Deleted

## 2013-12-17 DIAGNOSIS — N6452 Nipple discharge: Secondary | ICD-10-CM

## 2013-12-29 ENCOUNTER — Ambulatory Visit
Admission: RE | Admit: 2013-12-29 | Discharge: 2013-12-29 | Disposition: A | Discharge status: Home / Self Care | Payer: No Typology Code available for payment source | Source: Ambulatory Visit | Attending: Obstetrics and Gynecology | Admitting: Obstetrics and Gynecology

## 2013-12-29 ENCOUNTER — Other Ambulatory Visit (HOSPITAL_COMMUNITY): Payer: Self-pay | Admitting: Obstetrics and Gynecology

## 2013-12-29 ENCOUNTER — Encounter (HOSPITAL_COMMUNITY): Payer: Self-pay

## 2013-12-29 ENCOUNTER — Ambulatory Visit (HOSPITAL_COMMUNITY)
Admission: RE | Admit: 2013-12-29 | Discharge: 2013-12-29 | Disposition: A | Discharge status: Home / Self Care | Payer: Managed Care, Other (non HMO) | Source: Ambulatory Visit | Attending: Obstetrics and Gynecology | Admitting: Obstetrics and Gynecology

## 2013-12-29 ENCOUNTER — Other Ambulatory Visit (HOSPITAL_COMMUNITY): Payer: No Typology Code available for payment source | Admitting: Obstetrics and Gynecology

## 2013-12-29 VITALS — BP 126/80 | Temp 98.4°F | Ht 67.0 in | Wt 170.6 lb

## 2013-12-29 DIAGNOSIS — N6452 Nipple discharge: Secondary | ICD-10-CM

## 2013-12-29 DIAGNOSIS — N6310 Unspecified lump in the right breast, unspecified quadrant: Secondary | ICD-10-CM

## 2013-12-29 DIAGNOSIS — Z1239 Encounter for other screening for malignant neoplasm of breast: Secondary | ICD-10-CM

## 2013-12-29 HISTORY — DX: Essential (primary) hypertension: I10

## 2013-12-29 HISTORY — DX: Unspecified malignant neoplasm of skin, unspecified: C44.90

## 2013-12-29 NOTE — Progress Notes (Signed)
Complaints of right breast discharge for several months that is clear in color. Patient states the discharge is spontaneous and comes and goes. Patient complained of right breast pain that she rates as a mild pain 1-3 out of 10.  Pap Smear:  Pap smear not completed today. Last Pap smear was in 2010 at [facility] and normal. Per patient has no history of an abnormal Pap smear. Patient has a history of a hysterectomy 05/29/2005 for fibroids. Patient no longer needs Pap smears due to her history of a hysterectomy for benign reasons per ACOG and BCCCP guidelines. No Pap smear results in EPIC.  Physical exam: Breasts Breasts symmetrical. Multiple moles bilateral breasts. No nipple retraction bilateral breasts. No nipple discharge left breast. Clear/yellowish colored nipple discharge observed when patient expressed right breast. Sample of discharge sent to cytology. No lymphadenopathy. No lumps palpated bilateral breasts. Complaints of right center breast tenderness on exam. Referred patient to the [place] for diagnostic mammogram and possible right breast ultrasound. Appointment scheduled for Tuesday, December 29, 2013 at 1345.     Pelvic/Bimanual No Pap smear completed today since patient has a history of a hysterectomy for benign reasons. Pap smear not indicated per BCCCP guidelines.

## 2013-12-29 NOTE — Patient Instructions (Signed)
Explained to [name] that she no longer needs Pap smears due to her history of a hysterectomy for benign reasons. Referred patient to the [facility] for diagnostic mammogram and possible right breast ultrasound. Appointment scheduled for Tuesday, December 29, 2013 at 1345. Patient aware of appointment and will be there. Let patient know will follow up with her within the next couple weeks with results of breast discharge by phone. [name] verbalized understanding.  Antoni Stefan, Arvil Chaco, RN @T @ 12:07 PM

## 2014-01-05 ENCOUNTER — Other Ambulatory Visit (HOSPITAL_COMMUNITY): Payer: Self-pay | Admitting: Obstetrics and Gynecology

## 2014-01-05 DIAGNOSIS — N6310 Unspecified lump in the right breast, unspecified quadrant: Secondary | ICD-10-CM

## 2014-01-06 ENCOUNTER — Ambulatory Visit
Admission: RE | Admit: 2014-01-06 | Discharge: 2014-01-06 | Disposition: A | Discharge status: Home / Self Care | Payer: No Typology Code available for payment source | Source: Ambulatory Visit | Attending: Obstetrics and Gynecology | Admitting: Obstetrics and Gynecology

## 2014-01-06 DIAGNOSIS — N6310 Unspecified lump in the right breast, unspecified quadrant: Secondary | ICD-10-CM

## 2014-01-28 ENCOUNTER — Encounter (INDEPENDENT_AMBULATORY_CARE_PROVIDER_SITE_OTHER): Payer: Self-pay | Admitting: General Surgery

## 2014-01-28 DIAGNOSIS — N6310 Unspecified lump in the right breast, unspecified quadrant: Secondary | ICD-10-CM

## 2014-01-28 NOTE — Progress Notes (Addendum)
Patient ID: [name], female   DOB: 31-Jan-1951, 63 y.o.   MRN: [id number]  [name] 01/28/2014 10:25 AM Location: [facility] Patient #: [id number] DOB: 11-01-1950 Divorced / Language: Cleophus Molt / Race: White Female History of Present Illness Leslie Hollingshead MD; 01/28/2014 5:44 PM) Patient words: referred from Dr. Joneen Caraway after biopsy, right nipple discharge.  The patient is a 63 year old female    Note:She is referred by Dr. Joneen Caraway because of persistent right nipple discharge. The discharge is clear to light yellow. No blood. It appears be coming from the 7:00 position by report. Mammogram and ultrasound were performed. Ultrasound demonstrated a 3-4 mm lesion at the 7:00 position deep to the nipple areolar complex. 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 No family history of breast cancer. No breast pain. No palpable masses.  Other Problems Festus Holts, LPN; 62/69/4854 62:70 AM) Cancer Depression General anesthesia - complications Hemorrhoids High blood pressure Migraine Headache Oophorectomy Bilateral. Other disease, cancer, significant illness  Past Surgical History Festus Holts, LPN; 35/00/9381 82:99 AM) Breast Biopsy Right. Colon Polyp Removal - Colonoscopy Foot Surgery Left. Hysterectomy (not due to cancer) - Complete Shoulder Surgery Right.  Diagnostic Studies History Festus Holts, LPN; 37/16/9678 93:81 AM) Colonoscopy 5-10 years ago Mammogram within last year Pap Smear >5 years ago  Allergies Festus Holts, LPN; 01/75/1025 85:27 AM) Sulfabenzamide *CHEMICALS* Rash.  Medication History Festus Holts, LPN; 78/24/2353 61:44 AM) Lisinopril (10MG  Tablet, Oral qd) Active. Aspirin EC Low Strength (81MG  Tablet DR, Oral qd) Active. Calcium 600 (1500MG  Tablet, Oral qd) Active. Multi Vitamin Daily (Oral qd) Active. Medications Reconciled  Social History Festus Holts, LPN; 31/54/0086 76:19 AM) Alcohol use Occasional alcohol use. Caffeine use Tea. No drug use Tobacco use Never smoker.  Family History Festus Holts, LPN; 50/93/2671 24:58 AM) Anesthetic complications Mother. Arthritis Father. Cancer Mother. Cerebrovascular Accident Brother. Diabetes Mellitus Brother, Mother. Heart Disease Father. Hypertension Brother. Kidney Disease Brother. Prostate Cancer Brother.  Pregnancy / Birth History Festus Holts, LPN; 09/98/3382 50:53 AM) Age at menarche 11 years. Gravida 5 Irregular periods Maternal age 35-25 Para 4     Review of Systems Festus Holts LPN; 97/67/3419 37:90 AM) General Not Present- Appetite Loss, Chills, Fatigue, Fever, Night Sweats, Weight Gain and Weight Loss. Skin Not Present- Change in Wart/Mole, Dryness, Hives, Jaundice, New Lesions, Non-Healing Wounds, Rash and Ulcer. HEENT Not Present- Earache, Hearing Loss, Hoarseness, Nose Bleed, Oral Ulcers, Ringing in the Ears, Seasonal Allergies, Sinus Pain, Sore Throat, Visual Disturbances, Wears glasses/contact lenses and Yellow Eyes. Respiratory Not Present- Bloody sputum, Chronic Cough, Difficulty Breathing, Snoring and Wheezing. Breast Present- Nipple Discharge. Not Present- Breast Mass, Breast Pain and Skin Changes. Cardiovascular Present- Palpitations. Not Present- Chest Pain, Difficulty Breathing Lying Down, Leg Cramps, Rapid Heart Rate, Shortness of Breath and Swelling of Extremities. Gastrointestinal Not Present- Abdominal Pain, Bloating, Bloody Stool, Change in Bowel Habits, Chronic diarrhea, Constipation, Difficulty Swallowing, Excessive gas, Gets full quickly at meals, Hemorrhoids, Indigestion, Nausea, Rectal Pain and Vomiting. Female Genitourinary Not Present- Frequency, Nocturia, Painful Urination, Pelvic Pain and Urgency. Musculoskeletal Not Present- Back Pain, Joint Pain, Joint Stiffness, Muscle Pain, Muscle Weakness and Swelling of  Extremities. Neurological Present- Numbness and Tingling. Not Present- Decreased Memory, Fainting, Headaches, Seizures, Tremor, Trouble walking and Weakness. Psychiatric Not Present- Anxiety, Bipolar, Change in Sleep Pattern, Depression, Fearful and Frequent crying. Endocrine Not Present- Cold Intolerance, Excessive Hunger, Hair Changes, Heat Intolerance, Hot flashes and New Diabetes. Hematology Not Present- Easy Bruising,  Excessive bleeding, Gland problems, HIV and Persistent Infections.  Vitals Festus Holts LPN; 29/79/8921 19:41 AM) 01/28/2014 10:28 AM Weight: 174 lb Height: 67in Body Surface Area: 1.93 m Body Mass Index: 27.25 kg/m Temp.: 97.54F  Pulse: 84 (Regular)  Resp.: 22 (Unlabored)  BP: 122/86 (Sitting, Left Arm, Standard)     Physical Exam Leslie Hollingshead MD; 01/28/2014 5:40 PM)  The physical exam findings are as follows: Note:General: WDWN in NAD. Pleasant and cooperative.  HEENT: [place]/AT, no facial masses  EYES: EOMI, no icterus  NECK: Supple, no obvious mass or thyroid enlargement.  CV: RRR, no murmur, no JVD.  CHEST: Breath sounds equal and clear. Respirations nonlabored.  BREASTS: Symmetrical in size. No dominant masses, light yellow nipple discharge from 1:00 position of right breast. Small healing wound inferior aspect of right breast.  ABDOMEN: Soft, nontender, nondistended, no masses, no organomegaly, active bowel sounds, no scars, no hernias.  LYMPHATIC: No palpable cervical, supraclavicular, axillary adenopathy.  NEUROLOGIC: Alert and oriented, answers questions appropriately  PSYCHIATRIC: Normal mood, affect , and behavior.    Assessment & Plan Leslie Hollingshead MD; 01/28/2014 5:44 PM)  DISCHARGE FROM RIGHT NIPPLE [phone]) Impression: Persistent right nipple discharge and small lesion at the 7:00 position of the right breast. The discharge today appears to be coming from the 1:00 position. Post biopsy clip is in  place but is 1.5 cm medial to area of lesion.  Plan: I recommended excision of right breast ducts and breast biopsy after radioactive seed localization.  I have explained the procedure, risks, and aftercare to her. Risks include but are not limited to bleeding, infection, wound problems, loss of sensation, cosmetic deformity, anesthesia. She seems to [facility] and agrees with the plan.  Jackolyn Confer, M.D.

## 2014-01-29 ENCOUNTER — Encounter (INDEPENDENT_AMBULATORY_CARE_PROVIDER_SITE_OTHER): Payer: Self-pay | Admitting: General Surgery

## 2014-01-29 NOTE — Progress Notes (Unsigned)
Patient ID: [name], female   DOB: April 25, 1950, 64 y.o.   MRN: [id number]  The postbiopsy clip is 1.5 cm away from the area of the lesion. Thus, I will need the lesion to be localized. I spoke with her about radioactive seed localization and explained to her this procedure. I also told her that I may not remove the clip as it is so far away from the lesion. She seems to understand all this. We will  work on getting the operation scheduled.

## 2014-02-02 ENCOUNTER — Other Ambulatory Visit (INDEPENDENT_AMBULATORY_CARE_PROVIDER_SITE_OTHER): Payer: Self-pay | Admitting: General Surgery

## 2014-02-02 DIAGNOSIS — N6310 Unspecified lump in the right breast, unspecified quadrant: Secondary | ICD-10-CM

## 2014-02-12 ENCOUNTER — Encounter (HOSPITAL_BASED_OUTPATIENT_CLINIC_OR_DEPARTMENT_OTHER): Payer: Self-pay | Admitting: *Deleted

## 2014-02-12 NOTE — Progress Notes (Signed)
Reviewed with dr crews about echo 2009-ok for here

## 2014-02-12 NOTE — Progress Notes (Signed)
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

## 2014-02-15 ENCOUNTER — Encounter (HOSPITAL_BASED_OUTPATIENT_CLINIC_OR_DEPARTMENT_OTHER): Payer: Self-pay | Admitting: *Deleted

## 2014-02-17 ENCOUNTER — Ambulatory Visit
Admission: RE | Admit: 2014-02-17 | Discharge: 2014-02-17 | Disposition: A | Discharge status: Home / Self Care | Payer: No Typology Code available for payment source | Source: Ambulatory Visit | Attending: General Surgery | Admitting: General Surgery

## 2014-02-17 ENCOUNTER — Encounter (HOSPITAL_BASED_OUTPATIENT_CLINIC_OR_DEPARTMENT_OTHER)
Admission: RE | Admit: 2014-02-17 | Discharge: 2014-02-17 | Disposition: A | Discharge status: Home / Self Care | Payer: No Typology Code available for payment source | Source: Ambulatory Visit | Attending: General Surgery | Admitting: General Surgery

## 2014-02-17 DIAGNOSIS — Z01818 Encounter for other preprocedural examination: Secondary | ICD-10-CM | POA: Insufficient documentation

## 2014-02-17 DIAGNOSIS — N6310 Unspecified lump in the right breast, unspecified quadrant: Secondary | ICD-10-CM

## 2014-02-17 LAB — CBC WITH DIFFERENTIAL/PLATELET
BASOS ABS: 0 10*3/uL (ref 0.0–0.1)
BASOS PCT: 1 % (ref 0–1)
Eosinophils Absolute: 0.1 10*3/uL (ref 0.0–0.7)
Eosinophils Relative: 2 % (ref 0–5)
HCT: 40.4 % (ref 36.0–46.0)
Hemoglobin: 13.8 g/dL (ref 12.0–15.0)
Lymphocytes Relative: 31 % (ref 12–46)
Lymphs Abs: 1.2 10*3/uL (ref 0.7–4.0)
MCH: 32 pg (ref 26.0–34.0)
MCHC: 34.2 g/dL (ref 30.0–36.0)
MCV: 93.7 fL (ref 78.0–100.0)
Monocytes Absolute: 0.3 10*3/uL (ref 0.1–1.0)
Monocytes Relative: 7 % (ref 3–12)
NEUTROS ABS: 2.3 10*3/uL (ref 1.7–7.7)
NEUTROS PCT: 59 % (ref 43–77)
Platelets: 204 10*3/uL (ref 150–400)
RBC: 4.31 MIL/uL (ref 3.87–5.11)
RDW: 12 % (ref 11.5–15.5)
WBC: 3.9 10*3/uL — ABNORMAL LOW (ref 4.0–10.5)

## 2014-02-17 LAB — COMPREHENSIVE METABOLIC PANEL
ALBUMIN: 4.1 g/dL (ref 3.5–5.2)
ALK PHOS: 66 U/L (ref 39–117)
ALT: 11 U/L (ref 0–35)
AST: 17 U/L (ref 0–37)
Anion gap: 12 (ref 5–15)
BILIRUBIN TOTAL: 0.7 mg/dL (ref 0.3–1.2)
BUN: 11 mg/dL (ref 6–23)
CO2: 26 mEq/L (ref 19–32)
Calcium: 9.3 mg/dL (ref 8.4–10.5)
Chloride: 105 mEq/L (ref 96–112)
Creatinine, Ser: 0.79 mg/dL (ref 0.50–1.10)
GFR calc Af Amer: >90 mL/min (ref >90)
GFR calc non Af Amer: 86 mL/min — ABNORMAL LOW (ref >90)
Glucose, Bld: 90 mg/dL (ref 70–99)
POTASSIUM: 4.2 meq/L (ref 3.7–5.3)
SODIUM: 143 meq/L (ref 137–147)
Total Protein: 6.9 g/dL (ref 6.0–8.3)

## 2014-02-17 LAB — PROTIME-INR
INR: 1 (ref 0.00–1.49)
Prothrombin Time: 13.3 seconds (ref 11.6–15.2)

## 2014-02-18 ENCOUNTER — Encounter (HOSPITAL_BASED_OUTPATIENT_CLINIC_OR_DEPARTMENT_OTHER)
Admission: RE | Disposition: A | Discharge status: Home / Self Care | Payer: Self-pay | Source: Ambulatory Visit | Attending: General Surgery

## 2014-02-18 ENCOUNTER — Ambulatory Visit
Admit: 2014-02-18 | Discharge: 2014-02-18 | Disposition: A | Discharge status: Home / Self Care | Payer: No Typology Code available for payment source | Attending: General Surgery | Admitting: General Surgery

## 2014-02-18 ENCOUNTER — Ambulatory Visit (HOSPITAL_BASED_OUTPATIENT_CLINIC_OR_DEPARTMENT_OTHER): Payer: No Typology Code available for payment source | Admitting: Anesthesiology

## 2014-02-18 ENCOUNTER — Ambulatory Visit
Admission: RE | Admit: 2014-02-18 | Discharge: 2014-02-18 | Disposition: A | Discharge status: Home / Self Care | Payer: No Typology Code available for payment source | Source: Ambulatory Visit | Attending: General Surgery | Admitting: General Surgery

## 2014-02-18 ENCOUNTER — Ambulatory Visit (HOSPITAL_BASED_OUTPATIENT_CLINIC_OR_DEPARTMENT_OTHER): Payer: Self-pay | Admitting: Anesthesiology

## 2014-02-18 ENCOUNTER — Ambulatory Visit (HOSPITAL_BASED_OUTPATIENT_CLINIC_OR_DEPARTMENT_OTHER)
Admission: RE | Admit: 2014-02-18 | Discharge: 2014-02-18 | Disposition: A | Discharge status: Home / Self Care | Payer: Self-pay | Source: Ambulatory Visit | Attending: General Surgery | Admitting: General Surgery

## 2014-02-18 ENCOUNTER — Encounter (HOSPITAL_BASED_OUTPATIENT_CLINIC_OR_DEPARTMENT_OTHER): Payer: Self-pay | Admitting: Anesthesiology

## 2014-02-18 DIAGNOSIS — F329 Major depressive disorder, single episode, unspecified: Secondary | ICD-10-CM | POA: Insufficient documentation

## 2014-02-18 DIAGNOSIS — N6452 Nipple discharge: Secondary | ICD-10-CM | POA: Insufficient documentation

## 2014-02-18 DIAGNOSIS — D241 Benign neoplasm of right breast: Secondary | ICD-10-CM | POA: Insufficient documentation

## 2014-02-18 DIAGNOSIS — Z86718 Personal history of other venous thrombosis and embolism: Secondary | ICD-10-CM | POA: Insufficient documentation

## 2014-02-18 DIAGNOSIS — Z882 Allergy status to sulfonamides status: Secondary | ICD-10-CM | POA: Insufficient documentation

## 2014-02-18 DIAGNOSIS — N6310 Unspecified lump in the right breast, unspecified quadrant: Secondary | ICD-10-CM

## 2014-02-18 DIAGNOSIS — I10 Essential (primary) hypertension: Secondary | ICD-10-CM | POA: Insufficient documentation

## 2014-02-18 DIAGNOSIS — N6011 Diffuse cystic mastopathy of right breast: Secondary | ICD-10-CM | POA: Insufficient documentation

## 2014-02-18 DIAGNOSIS — G43909 Migraine, unspecified, not intractable, without status migrainosus: Secondary | ICD-10-CM | POA: Insufficient documentation

## 2014-02-18 DIAGNOSIS — N6091 Unspecified benign mammary dysplasia of right breast: Secondary | ICD-10-CM | POA: Insufficient documentation

## 2014-02-18 HISTORY — PX: BREAST LUMPECTOMY WITH RADIOACTIVE SEED LOCALIZATION: SHX6424

## 2014-02-18 HISTORY — DX: Personal history of other venous thrombosis and embolism: Z86.718

## 2014-02-18 LAB — POCT HEMOGLOBIN-HEMACUE: Hemoglobin: 19.7 g/dL — ABNORMAL HIGH (ref 12.0–15.0)

## 2014-02-18 SURGERY — BREAST LUMPECTOMY WITH RADIOACTIVE SEED LOCALIZATION
Anesthesia: General | Site: Breast | Laterality: Right

## 2014-02-18 MED ORDER — ONDANSETRON HCL 4 MG/2ML IJ SOLN
INTRAMUSCULAR | Status: DC | PRN
  Administered 2014-02-18: 4 mg via INTRAVENOUS

## 2014-02-18 MED ORDER — HYDROMORPHONE HCL 1 MG/ML IJ SOLN: 0.2500 mg | INTRAMUSCULAR | Status: DC | PRN

## 2014-02-18 MED ORDER — SCOPOLAMINE 1 MG/3DAYS TD PT72
1.0000 | MEDICATED_PATCH | Freq: Once | TRANSDERMAL | Status: DC
  Administered 2014-02-18: 1.5 mg via TRANSDERMAL

## 2014-02-18 MED ORDER — SODIUM CHLORIDE 0.9 % IJ SOLN: 3.0000 mL | INTRAMUSCULAR | Status: DC | PRN

## 2014-02-18 MED ORDER — FENTANYL CITRATE 0.05 MG/ML IJ SOLN: 50.0000 ug | INTRAMUSCULAR | Status: DC | PRN

## 2014-02-18 MED ORDER — ACETAMINOPHEN 325 MG PO TABS: 650.0000 mg | ORAL_TABLET | ORAL | Status: DC | PRN

## 2014-02-18 MED ORDER — BUPIVACAINE HCL (PF) 0.5 % IJ SOLN
INTRAMUSCULAR | Status: AC
  Filled 2014-02-18: qty 30

## 2014-02-18 MED ORDER — FENTANYL CITRATE 0.05 MG/ML IJ SOLN
INTRAMUSCULAR | Status: AC
  Filled 2014-02-18: qty 4

## 2014-02-18 MED ORDER — OXYCODONE HCL 5 MG PO TABS: 5.0000 mg | ORAL_TABLET | Freq: Once | ORAL | Status: DC | PRN

## 2014-02-18 MED ORDER — BUPIVACAINE-EPINEPHRINE (PF) 0.25% -1:200000 IJ SOLN
INTRAMUSCULAR | Status: AC
  Filled 2014-02-18: qty 30

## 2014-02-18 MED ORDER — ONDANSETRON HCL 4 MG/2ML IJ SOLN: 4.0000 mg | Freq: Four times a day (QID) | INTRAMUSCULAR | Status: DC | PRN

## 2014-02-18 MED ORDER — CEFAZOLIN SODIUM-DEXTROSE 2-3 GM-% IV SOLR
2.0000 g | INTRAVENOUS | Status: AC
  Administered 2014-02-18: 2 g via INTRAVENOUS

## 2014-02-18 MED ORDER — LIDOCAINE HCL (CARDIAC) 20 MG/ML IV SOLN
INTRAVENOUS | Status: DC | PRN
  Administered 2014-02-18: 75 mg via INTRAVENOUS

## 2014-02-18 MED ORDER — OXYCODONE HCL 5 MG/5ML PO SOLN: 5.0000 mg | Freq: Once | ORAL | Status: DC | PRN

## 2014-02-18 MED ORDER — DEXAMETHASONE SODIUM PHOSPHATE 4 MG/ML IJ SOLN
INTRAMUSCULAR | Status: DC | PRN
  Administered 2014-02-18: 10 mg via INTRAVENOUS

## 2014-02-18 MED ORDER — CEFAZOLIN SODIUM 1-5 GM-% IV SOLN
INTRAVENOUS | Status: AC
  Filled 2014-02-18: qty 50

## 2014-02-18 MED ORDER — ACETAMINOPHEN 650 MG RE SUPP: 650.0000 mg | RECTAL | Status: DC | PRN

## 2014-02-18 MED ORDER — FENTANYL CITRATE 0.05 MG/ML IJ SOLN
INTRAMUSCULAR | Status: DC | PRN
  Administered 2014-02-18: 100 ug via INTRAVENOUS

## 2014-02-18 MED ORDER — OXYCODONE HCL 5 MG PO TABS: 5.0000 mg | ORAL_TABLET | ORAL | Status: DC | PRN

## 2014-02-18 MED ORDER — MIDAZOLAM HCL 2 MG/2ML IJ SOLN: 1.0000 mg | INTRAMUSCULAR | Status: DC | PRN

## 2014-02-18 MED ORDER — MORPHINE SULFATE 2 MG/ML IJ SOLN
2.0000 mg | INTRAMUSCULAR | Status: DC | PRN
Start: 2014-02-18 — End: 2014-02-18

## 2014-02-18 MED ORDER — MIDAZOLAM HCL 5 MG/5ML IJ SOLN
INTRAMUSCULAR | Status: DC | PRN
  Administered 2014-02-18: 1 mg via INTRAVENOUS

## 2014-02-18 MED ORDER — BUPIVACAINE HCL (PF) 0.5 % IJ SOLN
INTRAMUSCULAR | Status: DC | PRN
  Administered 2014-02-18: 12 mL
  Administered 2014-02-18: 7 mL

## 2014-02-18 MED ORDER — PROPOFOL 10 MG/ML IV BOLUS
INTRAVENOUS | Status: DC | PRN
  Administered 2014-02-18: 150 mg via INTRAVENOUS

## 2014-02-18 MED ORDER — HYDROCODONE-ACETAMINOPHEN 5-325 MG PO TABS: 1.0000 | ORAL_TABLET | ORAL | Status: DC | PRN

## 2014-02-18 MED ORDER — MIDAZOLAM HCL 2 MG/2ML IJ SOLN
INTRAMUSCULAR | Status: AC
  Filled 2014-02-18: qty 2

## 2014-02-18 MED ORDER — SCOPOLAMINE 1 MG/3DAYS TD PT72
MEDICATED_PATCH | TRANSDERMAL | Status: AC
  Filled 2014-02-18: qty 1

## 2014-02-18 MED ORDER — LACTATED RINGERS IV SOLN
INTRAVENOUS | Status: DC
  Administered 2014-02-18 (×2): via INTRAVENOUS

## 2014-02-18 SURGICAL SUPPLY — 51 items
APL SKNCLS STERI-STRIP NONHPOA (GAUZE/BANDAGES/DRESSINGS) ×1
APPLIER CLIP 9.375 MED OPEN (MISCELLANEOUS)
APR CLP MED 9.3 20 MLT OPN (MISCELLANEOUS)
BENZOIN TINCTURE PRP APPL 2/3 (GAUZE/BANDAGES/DRESSINGS) ×3 IMPLANT
BLADE SURG 15 STRL LF DISP TIS (BLADE) ×1 IMPLANT
BLADE SURG 15 STRL SS (BLADE) ×3
CANISTER SUC SOCK COL 7IN (MISCELLANEOUS) IMPLANT
CANISTER SUCT 1200ML W/VALVE (MISCELLANEOUS) IMPLANT
CHLORAPREP W/TINT 26ML (MISCELLANEOUS) ×3 IMPLANT
CLIP APPLIE 9.375 MED OPEN (MISCELLANEOUS) IMPLANT
CLOSURE WOUND 1/2 X4 (GAUZE/BANDAGES/DRESSINGS) ×1
COVER BACK TABLE 60X90IN (DRAPES) ×3 IMPLANT
COVER MAYO STAND STRL (DRAPES) ×3 IMPLANT
COVER PROBE W GEL 5X96 (DRAPES) ×3 IMPLANT
DECANTER SPIKE VIAL GLASS SM (MISCELLANEOUS) IMPLANT
DEVICE DUBIN W/COMP PLATE 8390 (MISCELLANEOUS) ×2 IMPLANT
DRAPE PED LAPAROTOMY (DRAPES) ×3 IMPLANT
DRAPE UTILITY XL STRL (DRAPES) ×3 IMPLANT
ELECT COATED BLADE 2.86 ST (ELECTRODE) ×3 IMPLANT
ELECT REM PT RETURN 9FT ADLT (ELECTROSURGICAL) ×3
ELECTRODE REM PT RTRN 9FT ADLT (ELECTROSURGICAL) ×1 IMPLANT
GAUZE SPONGE 4X4 12PLY STRL (GAUZE/BANDAGES/DRESSINGS) ×5 IMPLANT
GLOVE BIOGEL PI IND STRL 7.0 (GLOVE) IMPLANT
GLOVE BIOGEL PI IND STRL 7.5 (GLOVE) IMPLANT
GLOVE BIOGEL PI IND STRL 8.5 (GLOVE) ×1 IMPLANT
GLOVE BIOGEL PI INDICATOR 7.0 (GLOVE) ×2
GLOVE BIOGEL PI INDICATOR 7.5 (GLOVE) ×2
GLOVE BIOGEL PI INDICATOR 8.5 (GLOVE) ×2
GLOVE ECLIPSE 7.0 STRL STRAW (GLOVE) ×2 IMPLANT
GLOVE ECLIPSE 8.0 STRL XLNG CF (GLOVE) ×3 IMPLANT
GLOVE SURG SS PI 7.0 STRL IVOR (GLOVE) ×2 IMPLANT
GOWN STRL REUS W/ TWL LRG LVL3 (GOWN DISPOSABLE) ×2 IMPLANT
GOWN STRL REUS W/TWL LRG LVL3 (GOWN DISPOSABLE) ×6
NDL HYPO 25X1 1.5 SAFETY (NEEDLE) ×1 IMPLANT
NEEDLE HYPO 25X1 1.5 SAFETY (NEEDLE) ×3 IMPLANT
NS IRRIG 1000ML POUR BTL (IV SOLUTION) ×1 IMPLANT
PACK BASIN DAY SURGERY FS (CUSTOM PROCEDURE TRAY) ×3 IMPLANT
PENCIL BUTTON HOLSTER BLD 10FT (ELECTRODE) ×3 IMPLANT
SLEEVE SCD COMPRESS KNEE MED (MISCELLANEOUS) ×2 IMPLANT
SPONGE GAUZE 4X4 12PLY STER LF (GAUZE/BANDAGES/DRESSINGS) ×3 IMPLANT
SPONGE LAP 4X18 X RAY DECT (DISPOSABLE) ×3 IMPLANT
STRIP CLOSURE SKIN 1/2X4 (GAUZE/BANDAGES/DRESSINGS) ×2 IMPLANT
SUT MON AB 4-0 PC3 18 (SUTURE) ×3 IMPLANT
SUT SILK 2 0 FS (SUTURE) ×3 IMPLANT
SUT VICRYL 3-0 CR8 SH (SUTURE) ×3 IMPLANT
SYR CONTROL 10ML LL (SYRINGE) ×3 IMPLANT
TOWEL OR 17X24 6PK STRL BLUE (TOWEL DISPOSABLE) ×4 IMPLANT
TOWEL OR NON WOVEN STRL DISP B (DISPOSABLE) ×3 IMPLANT
TUBE CONNECTING 20'X1/4 (TUBING)
TUBE CONNECTING 20X1/4 (TUBING) IMPLANT
YANKAUER SUCT BULB TIP NO VENT (SUCTIONS) IMPLANT

## 2014-02-18 NOTE — Anesthesia Postprocedure Evaluation (Signed)
Anesthesia Post Note  Patient: [name]  Procedure(s) Performed: Procedure(s) (LRB): RIGHT BREAST DUCT EXCISION AND RADIOACTIVE SEED LOCALIZED BREAST LUMPECTOMY (Right)  Anesthesia type: General  Patient location: PACU  Post pain: Pain level controlled and Adequate analgesia  Post assessment: Post-op Vital signs reviewed, Patient's Cardiovascular Status Stable, Respiratory Function Stable, Patent Airway and Pain level controlled  Last Vitals:  Filed Vitals:   02/18/14 1115  BP: 110/65  Pulse: 66  Temp:   Resp: 15    Post vital signs: Reviewed and stable  Level of consciousness: awake, alert  and oriented  Complications: No apparent anesthesia complications

## 2014-02-18 NOTE — Interval H&P Note (Signed)
History and Physical Interval Note:  02/18/2014 9:31 AM  [name]  has presented today for surgery, with the diagnosis of right breast mass and nipple discharge  The various methods of treatment have been discussed with the patient and family. After consideration of risks, benefits and other options for treatment, the patient has consented to  Procedure(s): RIGHT BREAST DUCT EXCISION AND RADIOACTIVE SEED LOCALIZED BREAST BIOPSY (Right) as a surgical intervention .  The patient's history has been reviewed, patient examined, no change in status, stable for surgery.  I have reviewed the patient's chart and labs.  Questions were answered to the patient's satisfaction.     Kaiden Dardis Lenna Sciara

## 2014-02-18 NOTE — Anesthesia Procedure Notes (Signed)
Procedure Name: LMA Insertion Performed by: Taariq Leitz W Pre-anesthesia Checklist: Patient identified, Emergency Drugs available, Suction available and Patient being monitored Patient Re-evaluated:Patient Re-evaluated prior to inductionOxygen Delivery Method: Circle System Utilized Preoxygenation: Pre-oxygenation with 100% oxygen Intubation Type: IV induction Ventilation: Mask ventilation without difficulty LMA: LMA inserted LMA Size: 4.0 Number of attempts: 1 Airway Equipment and Method: bite block Placement Confirmation: positive ETCO2 Tube secured with: Tape Dental Injury: Teeth and Oropharynx as per pre-operative assessment      

## 2014-02-18 NOTE — H&P (View-Only) (Signed)
Patient ID: [name], female   DOB: 1951-01-15, 63 y.o.   MRN: [id number]  [name] 01/28/2014 10:25 AM Location: [facility] Patient #: [id number] DOB: 12/26/1950 Divorced / Language: Cleophus Molt / Race: White Female History of Present Illness Leslie Hollingshead MD; 01/28/2014 5:44 PM) Patient words: referred from Dr. Joneen Caraway after biopsy, right nipple discharge.  The patient is a 63 year old female    Note:She is referred by Dr. Joneen Caraway because of persistent right nipple discharge. The discharge is clear to light yellow. No blood. It appears be coming from the 7:00 position by report. Mammogram and ultrasound were performed. Ultrasound demonstrated a 3-4 mm lesion at the 7:00 position deep to the nipple areolar complex. 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 No family history of breast cancer. No breast pain. No palpable masses.  Other Problems Festus Holts, LPN; 70/78/6754 49:20 AM) Cancer Depression General anesthesia - complications Hemorrhoids High blood pressure Migraine Headache Oophorectomy Bilateral. Other disease, cancer, significant illness  Past Surgical History Festus Holts, LPN; 01/20/1218 75:88 AM) Breast Biopsy Right. Colon Polyp Removal - Colonoscopy Foot Surgery Left. Hysterectomy (not due to cancer) - Complete Shoulder Surgery Right.  Diagnostic Studies History Festus Holts, LPN; 32/54/9826 41:58 AM) Colonoscopy 5-10 years ago Mammogram within last year Pap Smear >5 years ago  Allergies Festus Holts, LPN; 30/94/0768 08:81 AM) Sulfabenzamide *CHEMICALS* Rash.  Medication History Festus Holts, LPN; 02/14/5944 85:92 AM) Lisinopril (10MG  Tablet, Oral qd) Active. Aspirin EC Low Strength (81MG  Tablet DR, Oral qd) Active. Calcium 600 (1500MG  Tablet, Oral qd) Active. Multi Vitamin Daily (Oral qd) Active. Medications Reconciled  Social History Festus Holts, LPN; 92/44/6286 38:17 AM) Alcohol use Occasional alcohol use. Caffeine use Tea. No drug use Tobacco use Never smoker.  Family History Festus Holts, LPN; 71/16/5790 38:33 AM) Anesthetic complications Mother. Arthritis Father. Cancer Mother. Cerebrovascular Accident Brother. Diabetes Mellitus Brother, Mother. Heart Disease Father. Hypertension Brother. Kidney Disease Brother. Prostate Cancer Brother.  Pregnancy / Birth History Festus Holts, LPN; 38/32/9191 66:06 AM) Age at menarche 49 years. Gravida 5 Irregular periods Maternal age 89-25 Para 4     Review of Systems Festus Holts LPN; 00/45/9977 41:42 AM) General Not Present- Appetite Loss, Chills, Fatigue, Fever, Night Sweats, Weight Gain and Weight Loss. Skin Not Present- Change in Wart/Mole, Dryness, Hives, Jaundice, New Lesions, Non-Healing Wounds, Rash and Ulcer. HEENT Not Present- Earache, Hearing Loss, Hoarseness, Nose Bleed, Oral Ulcers, Ringing in the Ears, Seasonal Allergies, Sinus Pain, Sore Throat, Visual Disturbances, Wears glasses/contact lenses and Yellow Eyes. Respiratory Not Present- Bloody sputum, Chronic Cough, Difficulty Breathing, Snoring and Wheezing. Breast Present- Nipple Discharge. Not Present- Breast Mass, Breast Pain and Skin Changes. Cardiovascular Present- Palpitations. Not Present- Chest Pain, Difficulty Breathing Lying Down, Leg Cramps, Rapid Heart Rate, Shortness of Breath and Swelling of Extremities. Gastrointestinal Not Present- Abdominal Pain, Bloating, Bloody Stool, Change in Bowel Habits, Chronic diarrhea, Constipation, Difficulty Swallowing, Excessive gas, Gets full quickly at meals, Hemorrhoids, Indigestion, Nausea, Rectal Pain and Vomiting. Female Genitourinary Not Present- Frequency, Nocturia, Painful Urination, Pelvic Pain and Urgency. Musculoskeletal Not Present- Back Pain, Joint Pain, Joint Stiffness, Muscle Pain, Muscle Weakness and Swelling of  Extremities. Neurological Present- Numbness and Tingling. Not Present- Decreased Memory, Fainting, Headaches, Seizures, Tremor, Trouble walking and Weakness. Psychiatric Not Present- Anxiety, Bipolar, Change in Sleep Pattern, Depression, Fearful and Frequent crying. Endocrine Not Present- Cold Intolerance, Excessive Hunger, Hair Changes, Heat Intolerance, Hot flashes and New Diabetes. Hematology Not Present- Easy Bruising,  Excessive bleeding, Gland problems, HIV and Persistent Infections.  Vitals Festus Holts LPN; 78/24/2353 61:44 AM) 01/28/2014 10:28 AM Weight: 174 lb Height: 67in Body Surface Area: 1.93 m Body Mass Index: 27.25 kg/m Temp.: 97.75F  Pulse: 84 (Regular)  Resp.: 22 (Unlabored)  BP: 122/86 (Sitting, Left Arm, Standard)     Physical Exam Leslie Hollingshead MD; 01/28/2014 5:40 PM)  The physical exam findings are as follows: Note:General: WDWN in NAD. Pleasant and cooperative.  HEENT: [place]/AT, no facial masses  EYES: EOMI, no icterus  NECK: Supple, no obvious mass or thyroid enlargement.  CV: RRR, no murmur, no JVD.  CHEST: Breath sounds equal and clear. Respirations nonlabored.  BREASTS: Symmetrical in size. No dominant masses, light yellow nipple discharge from 1:00 position of right breast. Small healing wound inferior aspect of right breast.  ABDOMEN: Soft, nontender, nondistended, no masses, no organomegaly, active bowel sounds, no scars, no hernias.  LYMPHATIC: No palpable cervical, supraclavicular, axillary adenopathy.  NEUROLOGIC: Alert and oriented, answers questions appropriately  PSYCHIATRIC: Normal mood, affect , and behavior.    Assessment & Plan Leslie Hollingshead MD; 01/28/2014 5:44 PM)  DISCHARGE FROM RIGHT NIPPLE [phone]) Impression: Persistent right nipple discharge and small lesion at the 7:00 position of the right breast. The discharge today appears to be coming from the 1:00 position. Post biopsy clip is in  place but is 1.5 cm medial to area of lesion.  Plan: I recommended excision of right breast ducts and breast biopsy after radioactive seed localization.  I have explained the procedure, risks, and aftercare to her. Risks include but are not limited to bleeding, infection, wound problems, loss of sensation, cosmetic deformity, anesthesia. She seems to [facility] and agrees with the plan.  Jackolyn Confer, M.D.

## 2014-02-18 NOTE — Anesthesia Preprocedure Evaluation (Signed)
Anesthesia Evaluation  Patient identified by MRN, date of birth, ID band Patient awake    Reviewed: Allergy & Precautions, H&P , NPO status , Patient's Chart, lab work & pertinent test results  Airway Mallampati: II   Neck ROM: full    Dental   Pulmonary neg pulmonary ROS,          Cardiovascular hypertension, DVT     Neuro/Psych    GI/Hepatic   Endo/Other    Renal/GU      Musculoskeletal   Abdominal   Peds  Hematology   Anesthesia Other Findings   Reproductive/Obstetrics                             Anesthesia Physical Anesthesia Plan  ASA: II  Anesthesia Plan: General   Post-op Pain Management:    Induction: Intravenous  Airway Management Planned: LMA  Additional Equipment:   Intra-op Plan:   Post-operative Plan:   Informed Consent: I have reviewed the patients History and Physical, chart, labs and discussed the procedure including the risks, benefits and alternatives for the proposed anesthesia with the patient or authorized representative who has indicated his/her understanding and acceptance.     Plan Discussed with: CRNA, Anesthesiologist and Surgeon  Anesthesia Plan Comments:         Anesthesia Quick Evaluation

## 2014-02-18 NOTE — Transfer of Care (Signed)
Immediate Anesthesia Transfer of Care Note  Patient: [name]  Procedure(s) Performed: Procedure(s): RIGHT BREAST DUCT EXCISION AND RADIOACTIVE SEED LOCALIZED BREAST LUMPECTOMY (Right)  Patient Location: PACU  Anesthesia Type:General  Level of Consciousness: awake and alert   Airway & Oxygen Therapy: Patient Spontanous Breathing and Patient connected to face mask oxygen  Post-op Assessment: Report given to PACU RN and Post -op Vital signs reviewed and stable  Post vital signs: Reviewed and stable  Complications: No apparent anesthesia complications

## 2014-02-18 NOTE — Op Note (Signed)
Operative Note  [name] female 63 y.o. 02/18/2014  PREOPERATIVE DX:  Right breast nipple discharge and abnormal lesion on mammogram  POSTOPERATIVE DX:  Same  PROCEDURE:   Right nipple duct excision and right breast biopsy after radioactive seed localization         Surgeon: Odis Hollingshead   Assistants: none  Anesthesia: General LMA anesthesia  Indications:   This is a 63 year old female which had persistent right nipple discharge. Mammogram on ultrasound were performed. At the 7:00 position was an irregular lesion was biopsied and was consistent with some fibrocystic change but discharge persists. She now presents for the above procedure.  Radioactive seed localization was performed yesterday at the breast center.    Procedure Detail:  She was seen in the holding area in the right breast marked with my initials. She was brought to the operating room placed upon the operating table and general anesthetic was given. The right breast was sterilely prepped and draped. Pressure was placed around the nipple and the discharge appeared to be coming from a central duct.  At approximately the 6:00 position I identified the area of increased radioactivity with the neoprobe. 1/2 % plain Marcaine was injected in the inferior circumareolar area. A circumareolar incision was made through the skin and dermis from the 3:00 and 9:00 position area and I then exposed the the infra-areolar area. There appeared to be somewhat of a dilated duct present. Using sharp dissection I excised the ducts off the nipple.  I then performed a lumpectomy around the radioactive seed and occluded an area approximately 2 cm lateral to the seed as this is where the actual lesion was. Once this was done, a specimen mammogram was performed. The specimen had been oriented with silk sutures. Specimen mammogram demonstrated the seed and clip to be contained in the specimen. This is verified by the radiologist.  More local  anesthetic was injected into the wound. Hemostasis was obtained using electrocautery. Once hemostasis was obtained, the subcutaneous tissue was approximated with 3-0 Vicryl suture. The skin was closed with a running 4-0 Monocryl subcuticular stitch. Steri-Strips and a sterile dressing were applied.  She tolerated the procedure well without any apparent complications and was taken to the recovery room in satisfactory condition.  Specimens: right breast tissue and ducts        Complications:  * No complications entered in OR log *         Disposition: PACU - hemodynamically stable.         Condition: stable

## 2014-02-18 NOTE — Discharge Instructions (Addendum)
[place] Office Phone Number [phone]  BREAST BIOPSY/ PARTIAL MASTECTOMY: POST OP INSTRUCTIONS  Always review your discharge instruction sheet given to you by the facility where your surgery was performed.  IF YOU HAVE DISABILITY OR FAMILY LEAVE FORMS, YOU MUST BRING THEM TO THE OFFICE FOR PROCESSING.  DO NOT GIVE THEM TO YOUR DOCTOR.  1. A prescription for pain medication may be given to you upon discharge.  Take your pain medication as prescribed, if needed.  If narcotic pain medicine is not needed, then you may take acetaminophen (Tylenol) or ibuprofen (Advil) as needed. 2. Take your usually prescribed medications unless otherwise directed 3. If you need a refill on your pain medication, please contact your pharmacy.  They will contact our office to request authorization.  Prescriptions will not be filled after 5pm or on week-ends. 4. You should eat very light the first 24 hours after surgery, such as soup, crackers, pudding, etc.  Resume your normal diet the day after surgery. 5. Most patients will experience some swelling and bruising in the breast.  Ice packs and a good support bra will help.  Swelling and bruising can take several days to resolve.  6. It is common to experience some constipation if taking pain medication after surgery.  Increasing fluid intake and taking a stool softener will usually help or prevent this problem from occurring.  A mild laxative (Milk of Magnesia or Miralax) should be taken according to package directions if there are no bowel movements after 48 hours. 7. Unless discharge instructions indicate otherwise, you may remove your bandages 48 hours after surgery, and you may shower at that time.  You may have steri-strips (small skin tapes) in place directly over the incision.  These strips should be left on the skin.  If your surgeon used skin glue on the incision, you may shower in 24 hours.  The glue will flake off over the next 2-3 weeks.   Any sutures or staples will be removed at the office during your follow-up visit. 8. ACTIVITIES:  Light activities for 3 days then resume nornal activities after that when pain-free.  Wearing a good support bra or sports bra minimizes pain and swelling.  You may have sexual intercourse when it is comfortable. a. You may drive when you no longer are taking prescription pain medication, you can comfortably wear a seatbelt, and you can safely maneuver your car and apply brakes. b. RETURN TO WORK:  ______________________________________________________________________________________ 9. You should see your doctor in the office for a follow-up appointment approximately two weeks after your surgery. Please call and make this appointment, [phone]. 10. OTHER INSTRUCTIONS: _______________________________________________________________________________________________ _____________________________________________________________________________________________________________________________________ _____________________________________________________________________________________________________________________________________ _____________________________________________________________________________________________________________________________________  WHEN TO CALL YOUR DOCTOR: 1. Fever over 101.0 2. Nausea and/or vomiting. 3. Extreme swelling or bruising. 4. Continued bleeding from incision. 5. Increased pain, redness, or drainage from the incision.  The clinic staff is available to answer your questions during regular business hours.  Please dont hesitate to call and ask to speak to one of the nurses for clinical concerns.  If you have a medical emergency, go to the nearest emergency room or call 911.  A surgeon from [facility] is always on call at the hospital.  For further questions, please visit [email or web address]    Post Anesthesia Home Care  Instructions  Activity: Get plenty of rest for the remainder of the day. A responsible adult should stay with you for 24 hours following the procedure.  For the next 24 hours, DO NOT: -  Drive a car -Paediatric nurse -Drink alcoholic beverages -Take any medication unless instructed by your physician -Make any legal decisions or sign important papers.  Meals: Start with liquid foods such as gelatin or soup. Progress to regular foods as tolerated. Avoid greasy, spicy, heavy foods. If nausea and/or vomiting occur, drink only clear liquids until the nausea and/or vomiting subsides. Call your physician if vomiting continues.  Special Instructions/Symptoms: Your throat may feel dry or sore from the anesthesia or the breathing tube placed in your throat during surgery. If this causes discomfort, gargle with warm salt water. The discomfort should disappear within 24 hours.

## 2014-02-19 ENCOUNTER — Encounter (HOSPITAL_BASED_OUTPATIENT_CLINIC_OR_DEPARTMENT_OTHER): Payer: Self-pay | Admitting: General Surgery

## 2014-07-06 LAB — BASIC METABOLIC PANEL
BUN: 13 mg/dL (ref 4–21)
Creatinine: 0.8 mg/dL (ref 0.5–1.1)
Glucose: 92 mg/dL
POTASSIUM: 4 mmol/L (ref 3.4–5.3)
Sodium: 141 mmol/L (ref 137–147)

## 2014-12-02 ENCOUNTER — Ambulatory Visit (RURAL_HEALTH_CENTER): Payer: Self-pay | Admitting: Family

## 2014-12-02 VITALS — BP 143/88 | HR 79 | Temp 98.3°F | Resp 16 | Wt 168.8 lb

## 2014-12-02 DIAGNOSIS — B009 Herpesviral infection, unspecified: Secondary | ICD-10-CM

## 2014-12-02 MED ORDER — FAMCICLOVIR 500 MG PO TABS
500.0000 mg | ORAL_TABLET | Freq: Three times a day (TID) | ORAL | Status: AC
Start: 2014-12-02 — End: ?

## 2014-12-02 NOTE — Progress Notes (Signed)
PROGRESS NOTE    Patient [facility]: [facility]    Subjective  History of Present Illness:   Kristin Horne is a 64 y.o. female who presents with:  Chief Complaint   Patient presents with   . Rash     painful         Kristin Horne is a 64 YO female who presents with a rash. She is here with c/o a painful rash that she noticed this morning. Pt says something itched and when she scratched she felt a bump. She thought it may be chiggers so she put finger nail polish on her bumps with no relief.     Problem List:   There is no problem list on file for this patient.    Medications:     Prior to Admission medications    Medication Sig Start Date End Date Taking? Authorizing Provider   lisinopril (PRINIVIL,ZESTRIL) 10 MG tablet Take 10 mg by mouth daily.   Yes [provider]        Review of Systems:   Review of Systems   Skin: Positive for rash.        Rash on buttocks      Physical Exam:      Filed Vitals:    12/02/14 1911   BP: 143/88   Pulse: 79   Temp: 98.3 F (36.8 C)   Resp: 16   SpO2: 98%     Physical Exam   Genitourinary:   3 vesicals near rectum        Assessment:      Encounter Diagnosis   Name Primary?   . Herpes Yes     Plan:     No Follow-up on file.  Prescribed Famvir 500 MG 1 tab TID   OTC Aleve for pain as directed     Scribed for Dr. Beverely Pace, DNP by Dorthula Nettles, Medical Scribe.  12/02/2014      I, Dr. Beverely Pace, personally performed the services described in this documentation, as scribed by, Dorthula Nettles, in my presence. I agree with this documentation and It is both accurate and complete.

## 2014-12-02 NOTE — Progress Notes (Signed)
PROGRESS NOTE    Patient Name: Kristin Horne    Subjective  History of Present Illness:   Kristin Horne is a 64 y.o. female who presents with:  Chief Complaint   Patient presents with   . Rash     painful         Kristin Horne is a 64 YO female who presents with a rash. She is here with c/o a painful rash that she noticed this morning. Pt says something itched and when she scratched she felt a bump. She thought it may be chiggers so she put finger nail polish on her bumps with no relief.     Problem List:   There is no problem list on file for this patient.    Medications:     Prior to Admission medications    Medication Sig Start Date End Date Taking? Authorizing Provider   lisinopril (PRINIVIL,ZESTRIL) 10 MG tablet Take 10 mg by mouth daily.   Yes [provider]        Review of Systems:   Review of Systems   Skin: Positive for rash.        Rash on buttocks      Physical Exam:      Filed Vitals:    12/02/14 1911   BP: 143/88   Pulse: 79   Temp: 98.3 F (36.8 C)   Resp: 16   SpO2: 98%     Physical Exam   Genitourinary:   3 discrete vesicular lesions at the left gluteal fold.     Assessment:      Encounter Diagnosis   Name Primary?   . Herpes Yes     Plan:     Return if symptoms worsen or fail to improve.  Prescribed Famvir 500 MG 1 tab TID   OTC Aleve for pain as directed     Scribed for Dr. Beverely Pace, DNP by Dorthula Nettles, Medical Scribe.  12/02/2014      I, Dr. Beverely Pace, personally performed the services described in this documentation, as scribed by, Dorthula Nettles, in my presence. I agree with this documentation and It is both accurate and complete.

## 2015-02-02 DIAGNOSIS — M25561 Pain in right knee: Secondary | ICD-10-CM | POA: Diagnosis not present

## 2015-02-02 DIAGNOSIS — I493 Ventricular premature depolarization: Secondary | ICD-10-CM | POA: Diagnosis not present

## 2015-02-02 DIAGNOSIS — I10 Essential (primary) hypertension: Secondary | ICD-10-CM | POA: Diagnosis not present

## 2015-02-02 LAB — LIPID PANEL
CHOLESTEROL: 183 mg/dL (ref 0–200)
HDL: 70 mg/dL (ref 35–70)
LDL Cholesterol: 98 mg/dL
TRIGLYCERIDES: 71 mg/dL (ref 40–160)

## 2015-02-02 LAB — BASIC METABOLIC PANEL
BUN: 13 mg/dL (ref 4–21)
Creatinine: 0.8 mg/dL (ref 0.5–1.1)
Glucose: 101 mg/dL
POTASSIUM: 4.5 mmol/L (ref 3.4–5.3)
Sodium: 142 mmol/L (ref 137–147)

## 2015-02-02 LAB — HEPATIC FUNCTION PANEL
ALT: 11 U/L (ref 7–35)
AST: 15 U/L (ref 13–35)

## 2015-02-04 DIAGNOSIS — L570 Actinic keratosis: Secondary | ICD-10-CM | POA: Diagnosis not present

## 2015-02-04 DIAGNOSIS — D485 Neoplasm of uncertain behavior of skin: Secondary | ICD-10-CM | POA: Diagnosis not present

## 2015-02-08 DIAGNOSIS — C44612 Basal cell carcinoma of skin of right upper limb, including shoulder: Secondary | ICD-10-CM | POA: Diagnosis not present

## 2015-02-08 DIAGNOSIS — L578 Other skin changes due to chronic exposure to nonionizing radiation: Secondary | ICD-10-CM | POA: Diagnosis not present

## 2015-02-28 DIAGNOSIS — C44612 Basal cell carcinoma of skin of right upper limb, including shoulder: Secondary | ICD-10-CM | POA: Diagnosis not present

## 2015-03-30 DIAGNOSIS — J029 Acute pharyngitis, unspecified: Secondary | ICD-10-CM | POA: Diagnosis not present

## 2015-04-29 DIAGNOSIS — N951 Menopausal and female climacteric states: Secondary | ICD-10-CM | POA: Diagnosis not present

## 2015-04-29 DIAGNOSIS — I10 Essential (primary) hypertension: Secondary | ICD-10-CM | POA: Diagnosis not present

## 2015-04-29 DIAGNOSIS — Z1211 Encounter for screening for malignant neoplasm of colon: Secondary | ICD-10-CM | POA: Diagnosis not present

## 2015-04-29 DIAGNOSIS — I493 Ventricular premature depolarization: Secondary | ICD-10-CM | POA: Diagnosis not present

## 2015-04-29 DIAGNOSIS — Z1239 Encounter for other screening for malignant neoplasm of breast: Secondary | ICD-10-CM | POA: Diagnosis not present

## 2015-04-29 DIAGNOSIS — A6000 Herpesviral infection of urogenital system, unspecified: Secondary | ICD-10-CM | POA: Diagnosis not present

## 2015-04-29 DIAGNOSIS — Z0000 Encounter for general adult medical examination without abnormal findings: Secondary | ICD-10-CM | POA: Diagnosis not present

## 2015-05-04 ENCOUNTER — Other Ambulatory Visit: Payer: Self-pay | Admitting: Family Medicine

## 2015-05-04 DIAGNOSIS — Z1239 Encounter for other screening for malignant neoplasm of breast: Secondary | ICD-10-CM

## 2015-05-04 DIAGNOSIS — E2839 Other primary ovarian failure: Secondary | ICD-10-CM

## 2015-05-13 DIAGNOSIS — L820 Inflamed seborrheic keratosis: Secondary | ICD-10-CM | POA: Diagnosis not present

## 2015-05-13 DIAGNOSIS — L814 Other melanin hyperpigmentation: Secondary | ICD-10-CM | POA: Diagnosis not present

## 2015-05-13 DIAGNOSIS — L821 Other seborrheic keratosis: Secondary | ICD-10-CM | POA: Diagnosis not present

## 2015-05-13 DIAGNOSIS — D1800 Hemangioma unspecified site: Secondary | ICD-10-CM | POA: Diagnosis not present

## 2015-05-13 DIAGNOSIS — Z808 Family history of malignant neoplasm of other organs or systems: Secondary | ICD-10-CM | POA: Diagnosis not present

## 2015-05-13 DIAGNOSIS — Z85828 Personal history of other malignant neoplasm of skin: Secondary | ICD-10-CM | POA: Diagnosis not present

## 2015-05-13 DIAGNOSIS — L738 Other specified follicular disorders: Secondary | ICD-10-CM | POA: Diagnosis not present

## 2015-05-13 DIAGNOSIS — D225 Melanocytic nevi of trunk: Secondary | ICD-10-CM | POA: Diagnosis not present

## 2015-05-13 DIAGNOSIS — Z23 Encounter for immunization: Secondary | ICD-10-CM | POA: Diagnosis not present

## 2015-05-13 DIAGNOSIS — Z86018 Personal history of other benign neoplasm: Secondary | ICD-10-CM | POA: Diagnosis not present

## 2015-05-13 DIAGNOSIS — L570 Actinic keratosis: Secondary | ICD-10-CM | POA: Diagnosis not present

## 2015-05-25 DIAGNOSIS — R05 Cough: Secondary | ICD-10-CM | POA: Diagnosis not present

## 2015-05-25 DIAGNOSIS — J3489 Other specified disorders of nose and nasal sinuses: Secondary | ICD-10-CM | POA: Diagnosis not present

## 2015-05-31 ENCOUNTER — Ambulatory Visit
Admission: RE | Admit: 2015-05-31 | Discharge: 2015-05-31 | Disposition: A | Discharge status: Home / Self Care | Payer: Medicare Other | Source: Ambulatory Visit | Attending: Family Medicine | Admitting: Family Medicine

## 2015-05-31 DIAGNOSIS — Z1231 Encounter for screening mammogram for malignant neoplasm of breast: Secondary | ICD-10-CM | POA: Diagnosis not present

## 2015-05-31 DIAGNOSIS — Z1239 Encounter for other screening for malignant neoplasm of breast: Secondary | ICD-10-CM

## 2015-05-31 DIAGNOSIS — Z780 Asymptomatic menopausal state: Secondary | ICD-10-CM | POA: Diagnosis not present

## 2015-05-31 DIAGNOSIS — E2839 Other primary ovarian failure: Secondary | ICD-10-CM

## 2015-06-02 ENCOUNTER — Other Ambulatory Visit: Payer: Self-pay | Admitting: Family Medicine

## 2015-06-02 DIAGNOSIS — R928 Other abnormal and inconclusive findings on diagnostic imaging of breast: Secondary | ICD-10-CM

## 2015-06-09 ENCOUNTER — Ambulatory Visit
Admission: RE | Admit: 2015-06-09 | Discharge: 2015-06-09 | Disposition: A | Discharge status: Home / Self Care | Payer: Medicare Other | Source: Ambulatory Visit | Attending: Family Medicine | Admitting: Family Medicine

## 2015-06-09 DIAGNOSIS — N63 Unspecified lump in breast: Secondary | ICD-10-CM | POA: Diagnosis not present

## 2015-06-09 DIAGNOSIS — R928 Other abnormal and inconclusive findings on diagnostic imaging of breast: Secondary | ICD-10-CM | POA: Diagnosis not present

## 2015-08-08 DIAGNOSIS — H1131 Conjunctival hemorrhage, right eye: Secondary | ICD-10-CM | POA: Diagnosis not present

## 2015-09-01 DIAGNOSIS — H10811 Pingueculitis, right eye: Secondary | ICD-10-CM | POA: Diagnosis not present

## 2015-09-01 DIAGNOSIS — H1131 Conjunctival hemorrhage, right eye: Secondary | ICD-10-CM | POA: Diagnosis not present

## 2015-09-01 DIAGNOSIS — H11153 Pinguecula, bilateral: Secondary | ICD-10-CM | POA: Diagnosis not present

## 2015-09-15 DIAGNOSIS — H04123 Dry eye syndrome of bilateral lacrimal glands: Secondary | ICD-10-CM | POA: Diagnosis not present

## 2015-09-15 DIAGNOSIS — H10811 Pingueculitis, right eye: Secondary | ICD-10-CM | POA: Diagnosis not present

## 2015-09-15 DIAGNOSIS — H1131 Conjunctival hemorrhage, right eye: Secondary | ICD-10-CM | POA: Diagnosis not present

## 2015-10-27 ENCOUNTER — Ambulatory Visit (INDEPENDENT_AMBULATORY_CARE_PROVIDER_SITE_OTHER)
Admission: RE | Admit: 2015-10-27 | Discharge: 2015-10-27 | Disposition: A | Discharge status: Home / Self Care | Payer: Medicare Other | Source: Ambulatory Visit | Attending: Family Medicine | Admitting: Family Medicine

## 2015-10-27 ENCOUNTER — Ambulatory Visit (INDEPENDENT_AMBULATORY_CARE_PROVIDER_SITE_OTHER): Payer: Medicare Other | Admitting: Family Medicine

## 2015-10-27 ENCOUNTER — Encounter: Payer: Self-pay | Admitting: Family Medicine

## 2015-10-27 VITALS — BP 120/80 | HR 85 | Temp 98.4°F | Resp 12 | Ht 67.0 in | Wt 173.5 lb

## 2015-10-27 DIAGNOSIS — K219 Gastro-esophageal reflux disease without esophagitis: Secondary | ICD-10-CM | POA: Insufficient documentation

## 2015-10-27 DIAGNOSIS — R053 Chronic cough: Secondary | ICD-10-CM

## 2015-10-27 DIAGNOSIS — I10 Essential (primary) hypertension: Secondary | ICD-10-CM | POA: Insufficient documentation

## 2015-10-27 DIAGNOSIS — R05 Cough: Secondary | ICD-10-CM | POA: Diagnosis not present

## 2015-10-27 DIAGNOSIS — I8393 Asymptomatic varicose veins of bilateral lower extremities: Secondary | ICD-10-CM | POA: Insufficient documentation

## 2015-10-27 DIAGNOSIS — M25561 Pain in right knee: Secondary | ICD-10-CM

## 2015-10-27 DIAGNOSIS — M25562 Pain in left knee: Secondary | ICD-10-CM

## 2015-10-27 NOTE — Progress Notes (Signed)
Leslie. [name] is a 65 y.o.female, who is here today to follow on HTN.  I have been taking care of [name] for about a year, former pt of [employer].  Currently she is on Lisinopril 10 mg daily. She taking medications as instructed, no side effects reported.  She has not noted unusual headache, visual changes, exertional chest pain, dyspnea,  focal weakness, or edema.    Knee pain: C/O bilateral knee pain, for years but getting worse, severe.  L>R, pain is exacerbated by "exteneous" exercise or running, 1-2 days after she cannot even walk. Taking Ibuprofen as needed, last time on 10/18/15.  No recent injures.  Also left foot plantar pain, planning of following with podiatrist.   + IP joint aching but it is mild. + Stiffness. No limitation of range of motion. She denies lower back pain, numbness, tingling, burning, or cold extremities.  Cough Since 2010 she has had intermittent productive cough, clear sputum.  + Heartburn, when she eats french fries she feels like they get "stuck" in the meddle of her chest, she avoid eating this, so most of the time she is fine. She has not tried OTC medication.  Denies abdominal pain, nausea, vomiting, changes in bowel habits, blood in stool or melena.  Left thoracic pain 2 weeks ago, improved.  Now mild soreness but it was severe, she was considering going to the ER but decided to wait, thought it was a muscle spasm. No radiated. No Hx of tobacco use.   -Also concerned about varicose veins, she wonders what to do to keep it from getting worse. No calves pain or edema.   Review of Systems  Constitutional: Negative for fever, activity change, appetite change, fatigue and unexpected weight change.  HENT: Positive for postnasal drip and rhinorrhea. Negative for mouth sores, nosebleeds, trouble swallowing and voice change.   Eyes: Negative for redness and visual disturbance.  Respiratory: Positive for cough. Negative for  shortness of breath and wheezing.   Cardiovascular: Negative for chest pain, palpitations and leg swelling.  Gastrointestinal: Negative for nausea, vomiting, abdominal pain and blood in stool.       Negative for changes in bowel habits.  Genitourinary: Negative for dysuria, hematuria, decreased urine volume and difficulty urinating.  Musculoskeletal: Positive for back pain and arthralgias. Negative for gait problem and neck pain.  Skin: Negative for color change and rash.  Neurological: Negative for seizures, syncope, weakness, numbness and headaches.  Psychiatric/Behavioral: Negative for confusion and sleep disturbance. The patient is nervous/anxious.      Current Outpatient Prescriptions on File Prior to Visit  Medication Sig Dispense Refill  . aspirin EC 81 MG tablet Take 81 mg by mouth daily.    . Calcium Carb-Cholecalciferol (CALCIUM 600 + D PO) Take by mouth.    . cholecalciferol (VITAMIN D) 1000 UNITS tablet Take 1,000 Units by mouth daily.    Marland Kitchen lisinopril (PRINIVIL,ZESTRIL) 10 MG tablet Take 10 mg by mouth daily.    . Multiple Vitamins-Minerals (MULTIVITAMIN WITH MINERALS) tablet Take 1 tablet by mouth daily.    . vitamin B-12 (CYANOCOBALAMIN) 1000 MCG tablet Take 1,000 mcg by mouth daily.     No current facility-administered medications on file prior to visit.     Past Medical History  Diagnosis Date  . Hypertension   . Skin cancer     skin cancers removed  . History of DVT of lower extremity 2007,2009    left    Allergies  Allergen  Reactions  . Sulfa Antibiotics Rash    Social History   Social History  . Marital Status: Divorced    Spouse Name: N/A  . Number of Children: N/A  . Years of Education: N/A   Social History Main Topics  . Smoking status: Never Smoker   . Smokeless tobacco: Never Used  . Alcohol Use: Yes     Comment: rarely  . Drug Use: No  . Sexual Activity: Yes    Birth Control/ Protection: Surgical   Other Topics Concern  . None    Social History Narrative    Filed Vitals:   10/27/15 1400  BP: 120/80  Pulse: 85  Temp: 98.4 F (36.9 C)  Resp: 12   Body mass index is 27.17 kg/(m^2). SpO2 Readings from Last 3 Encounters:  10/27/15 97%  02/18/14 100%      Physical Exam  Nursing note and vitals reviewed. Constitutional: She is oriented to person, place, and time. She appears well-developed. No distress.  HENT:  Head: Atraumatic.  Mouth/Throat: Oropharynx is clear and moist and mucous membranes are normal.  Eyes: Conjunctivae and EOM are normal. Pupils are equal, round, and reactive to light.  Neck: No tracheal deviation present. No thyromegaly present.  Cardiovascular: Normal rate and regular rhythm.   No murmur heard. Pulses:      Dorsalis pedis pulses are 2+ on the right side, and 2+ on the left side.  Varicose veins, moderate, bilateral  Respiratory: Effort normal and breath sounds normal. No respiratory distress.  GI: Soft. She exhibits no mass. There is no tenderness.  Musculoskeletal: She exhibits no edema.       Thoracic back: She exhibits no tenderness.  Knee: on inspection no effusion, erythema, or deformities. Valgus and varus stress normal,  anterior and posterior drawer test negative. Mild decrease flexion left hip. Mild crepitus knees bilateral, no pain elicited.  I do not appreciate prominences on poplitea fossa bilateral.  Lymphadenopathy:    She has no cervical adenopathy.  Neurological: She is alert and oriented to person, place, and time. She has normal strength. Coordination and gait normal.  Skin: Skin is warm. No erythema.  Psychiatric: Her speech is normal. Her mood appears anxious.  Well groomed, good eye contact.    ASSESSMENT AND PLAN:   [name] was seen today for new patient (initial visit).  Diagnoses and all orders for this visit:  Gastroesophageal reflux disease without esophagitis  GERD precautions discussed. After discussion or a few treatment options and  some side effects,she is not interested in PPI, she doe snot feel like treatment is needed since "it is not bad". She agrees with trying OTC Zantac 150 mg at bedtime prn.   Cough, persistent  Possible causes discussed: GERD, ACEI, and allergic rhinitis/post nasal drainage. She does not want to stop Lisinopril because she is glad with BP control and afraid of trying a new medication. She could try intra nasal steroids, Nasocort or Rhinocort + OTC antihistaminic (Loratadie at bedtime or Allegra).   -     DG Chest 2 View; Future  Essential hypertension, benign  Adequately controlled. No changes in current management. DASH diet recommended. Eye exam recommended annually. F/U in 6 months, before if needed.  -     Basic metabolic panel  Knee pain, bilateral  Most likely OA, she is concerned about this and would like ortho evaluation. We discussed possible treatment options: intra articular steroids, Hyaluronan, PT among some. Tai Chi recommended.   -  Ambulatory referral to Orthopedic Surgery  Varicose veins of both lower extremities  Compression stockings , LE elevation, ankle ROM exercises.      -She was advised to return sooner than planned today if new concerns arise.     Betty G. Martinique, MD  [facility]. [place] office.

## 2015-10-27 NOTE — Patient Instructions (Addendum)
A few things to remember from today's visit:   Gastroesophageal reflux disease without esophagitis  Cough, persistent - Plan: DG Chest 2 View  Essential hypertension, benign - Plan: Basic metabolic panel  Knee pain, bilateral - Plan: Ambulatory referral to Orthopedic Surgery  Tai Chi may help .   Vein disease is a condition that can affect the veins in the legs. It can cause leg pain, varicose veins, swollen legs, or open sores. Varicose veins are swollen and twisted veins. Things that may help: leg exercises (ankle flexion, walking),compression stocking, OTC horse chestnut seed extract 300 mg twice daily, for itchy skin cortisone and moisturizers.  Compression stockings- [facility] in [place]   We have ordered labs or studies at this visit.  It can take up to 1-2 weeks for results and processing. IF results require follow up or explanation, we will call you with instructions. Clinically stable results will be released to your MYCHART. If you have not heard from us or cannot find your results in MYCHART in 2 weeks please contact our office at [phone].  If you are not yet signed up for MYCHART, please consider signing up   Medicare covers a annual preventive visit, which is strongly recommended , it is once per year and involves a series of questions to identify risk factors; so we can try to prevent possible complications. This does not need to be done by a doctor.  We have a nurse (RN) here that is highly qualified to do it, it can be arrange same date you have a follow up appointment with me or labs scheduled, and it 100% covered by Medicare. So before you leave today I would like for you to arrange visit with Ms Susan Hauck for Medicare wellness visit.  Please be sure medication list is accurate. If a new problem present, please set up appointment sooner than planned today.          

## 2015-10-27 NOTE — Progress Notes (Signed)
Pre visit review using our clinic review tool, if applicable. No additional management support is needed unless otherwise documented below in the visit note. 

## 2015-10-28 LAB — BASIC METABOLIC PANEL
BUN: 19 mg/dL (ref 6–23)
CALCIUM: 9.4 mg/dL (ref 8.4–10.5)
CO2: 26 meq/L (ref 19–32)
Chloride: 108 mEq/L (ref 96–112)
Creatinine, Ser: 0.81 mg/dL (ref 0.40–1.20)
GFR: 75.25 mL/min (ref >60.00)
GLUCOSE: 86 mg/dL (ref 70–99)
POTASSIUM: 4.4 meq/L (ref 3.5–5.1)
Sodium: 144 mEq/L (ref 135–145)

## 2015-11-08 DIAGNOSIS — J302 Other seasonal allergic rhinitis: Secondary | ICD-10-CM | POA: Diagnosis not present

## 2015-11-08 DIAGNOSIS — K219 Gastro-esophageal reflux disease without esophagitis: Secondary | ICD-10-CM | POA: Diagnosis not present

## 2015-11-08 DIAGNOSIS — J029 Acute pharyngitis, unspecified: Secondary | ICD-10-CM | POA: Diagnosis not present

## 2015-11-22 ENCOUNTER — Encounter: Payer: Self-pay | Admitting: Family Medicine

## 2015-11-29 DIAGNOSIS — M1712 Unilateral primary osteoarthritis, left knee: Secondary | ICD-10-CM | POA: Diagnosis not present

## 2015-11-29 DIAGNOSIS — M1711 Unilateral primary osteoarthritis, right knee: Secondary | ICD-10-CM | POA: Diagnosis not present

## 2015-11-29 DIAGNOSIS — L700 Acne vulgaris: Secondary | ICD-10-CM | POA: Diagnosis not present

## 2015-11-29 DIAGNOSIS — M170 Bilateral primary osteoarthritis of knee: Secondary | ICD-10-CM | POA: Diagnosis not present

## 2015-11-29 DIAGNOSIS — L570 Actinic keratosis: Secondary | ICD-10-CM | POA: Diagnosis not present

## 2015-12-07 ENCOUNTER — Telehealth: Payer: Self-pay | Admitting: Family Medicine

## 2015-12-07 NOTE — Telephone Encounter (Signed)
Pt request refill  lisinopril (PRINIVIL,ZESTRIL) 10 MG tablet  90 day w/ refills [facility]/ [address]

## 2015-12-07 NOTE — Telephone Encounter (Signed)
Pt would like results of Xray done July 13. Please call back.

## 2015-12-08 DIAGNOSIS — R05 Cough: Secondary | ICD-10-CM | POA: Diagnosis not present

## 2015-12-08 MED ORDER — LISINOPRIL 10 MG PO TABS: 10.0000 mg | ORAL_TABLET | Freq: Every day | ORAL | 1 refills | Status: DC

## 2015-12-08 NOTE — Telephone Encounter (Signed)
Had to call  X-ray to see why the x-ray results were not put in. They released the results, informed patient that the x-ray showed no active cardiopulmonary disease. Patient verbalized understanding.

## 2015-12-08 NOTE — Telephone Encounter (Signed)
Rx sent 

## 2016-03-20 ENCOUNTER — Ambulatory Visit: Payer: Medicare Other | Admitting: Family Medicine

## 2016-03-22 ENCOUNTER — Ambulatory Visit (INDEPENDENT_AMBULATORY_CARE_PROVIDER_SITE_OTHER): Payer: Medicare Other | Admitting: Family Medicine

## 2016-03-22 ENCOUNTER — Encounter: Payer: Self-pay | Admitting: Family Medicine

## 2016-03-22 VITALS — BP 118/80 | HR 64 | Resp 12 | Ht 67.0 in | Wt 172.0 lb

## 2016-03-22 DIAGNOSIS — K120 Recurrent oral aphthae: Secondary | ICD-10-CM | POA: Diagnosis not present

## 2016-03-22 DIAGNOSIS — J029 Acute pharyngitis, unspecified: Secondary | ICD-10-CM

## 2016-03-22 NOTE — Patient Instructions (Signed)
A few things to remember from today's visit:   Aphthae, oral  Sore throat in the morning  Date I don't see anything that is concerning about mouth cancer.  Sore throat could be the beginning of a viral illness or part of allergies. Monitor for new symptoms. Keep neck supple with her dentist.  Please be sure medication list is accurate. If a new problem present, please set up appointment sooner than planned today.

## 2016-03-22 NOTE — Progress Notes (Signed)
HPI:  ACUTE VISIT:  Chief Complaint  Patient presents with  . red rash in mouth    [name] is a 65 y.o. female, who is here today complaining of soreness on left base of tongue and a couple days later under tongue. 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 She denies tobacco use, gum bleeding, dysphagia, abnormal wt loss, swollen gland on neck, or rash.  Soreness on left tongue base has resolved and sore lesion under tongue has improved.  She has dental check ups q 6 months.  Also concerned because today morning she woke up with sore throat on left side, resolved. She states that she has had "sinus problems" in the past and now having postnasal drainage. 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 No recent travel. She has not used any OTC medication.      Review of Systems  Constitutional: Negative for activity change, appetite change, fatigue and fever.  HENT: Positive for mouth sores, postnasal drip and sore throat. Negative for dental problem, ear pain, nosebleeds, sinus pressure, sneezing, trouble swallowing and voice change.   Respiratory: Positive for cough. Negative for shortness of breath and wheezing.   Gastrointestinal: Negative for abdominal pain, diarrhea, nausea and vomiting.  Musculoskeletal: Negative for myalgias and neck pain.  Skin: Negative for rash and wound.  Allergic/Immunologic: Positive for environmental allergies.  Neurological: Negative for headaches.  Hematological: Negative for adenopathy. Does not bruise/bleed easily.  Psychiatric/Behavioral: Negative for confusion. The patient is nervous/anxious.       Current Outpatient Prescriptions on File Prior to Visit  Medication Sig Dispense Refill  . Calcium  Carb-Cholecalciferol (CALCIUM 600 + D PO) Take by mouth.    Marland Kitchen lisinopril (PRINIVIL,ZESTRIL) 10 MG tablet Take 1 tablet (10 mg total) by mouth daily. 90 tablet 1  . Multiple Vitamins-Minerals (MULTIVITAMIN WITH MINERALS) tablet Take 1 tablet by mouth daily.    . vitamin B-12 (CYANOCOBALAMIN) 1000 MCG tablet Take 1,000 mcg by mouth daily.     No current facility-administered medications on file prior to visit.      Past Medical History:  Diagnosis Date  . 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 Hypertension   . Skin cancer    skin cancers removed   Allergies  Allergen Reactions  . Sulfa Antibiotics Rash    Social History   Social History  . Marital status: Divorced    Spouse name: N/A  . Number of children: N/A  . Years of education: N/A   Social History Main Topics  . Smoking status: Never Smoker  . Smokeless tobacco: Never Used  . Alcohol use Yes     Comment: rarely  . Drug use: No  . Sexual activity: Yes    Birth control/ protection: Surgical   Other Topics Concern  . None   Social History Narrative  . None    Vitals:   03/22/16 1312  BP: 118/80  Pulse: 64  Resp: 12   Body mass index is 26.94 kg/m.     Physical Exam  Nursing note and vitals reviewed. Constitutional: She is oriented to person, place, and time. She appears well-developed. She does not appear [name]. No distress.  HENT:  Head: Atraumatic.  Mouth/Throat: Uvula is midline,  oropharynx is clear and moist and mucous membranes are normal.  Inferior aspect of tongue, left side there is a 1 mm or less while micropapular lesion(beside frenulum) mildly tender and surrounded by minimal erythema 1 mm, no induration.  Post nasal drainage.  Eyes: Conjunctivae are normal.  Neck: No muscular tenderness present. No edema and no erythema present.  Cardiovascular: Normal rate and regular rhythm.   No murmur heard. Respiratory: Effort normal and breath sounds normal. No stridor. No  respiratory distress.  Lymphadenopathy:       Head (right side): No submental, no submandibular, no preauricular and no posterior auricular adenopathy present.       Head (left side): No submental, no submandibular, no preauricular and no posterior auricular adenopathy present.    She has no cervical adenopathy.  Neurological: She is alert and oriented to person, place, and time. She has normal strength.  Skin: Skin is warm. No rash noted. No erythema.  Psychiatric: Her speech is normal. Her mood appears anxious.  Well groomed, good eye contact.      ASSESSMENT AND PLAN:     [name] was seen today for red rash in mouth.  Diagnoses and all orders for this visit:  Aphthae, oral  Reassured. Explained that on examination today I do not find any lesion that raise the suspicion for malignancy. We discussed symptoms that raise the concern about SCC or other oral malignancy as well as risk factors. I recommended continuing monitoring. Keep Appointment with dentist. She is not interested in topical steroid paste. OTC Orajel might help with pain.   Sore throat in the morning  We discussed possible causes: Beginning of a viral illness versus allergies. Monitor for new symptoms. OTC throat lozenges might help if needed. Follow-up as needed.      Return if symptoms worsen or fail to improve.     -[name] was advised to return or notify a doctor immediately if symptoms worsen or persist or new concerns arise.       Josimar Corning G. Martinique, MD  [facility]. [place] office.

## 2016-03-22 NOTE — Progress Notes (Signed)
Pre visit review using our clinic review tool, if applicable. No additional management support is needed unless otherwise documented below in the visit note. 

## 2016-03-26 ENCOUNTER — Telehealth: Payer: Self-pay | Admitting: Family Medicine

## 2016-03-26 NOTE — Telephone Encounter (Signed)
Called and spoke with patient. Toe injury, either middle toe or the toe right beside the big toe. Toe has some tingling in it and a sore spot. Patient had a fracture in the past.   Patient cannot come in today, and our appt times available will not work for her. Patient scheduled to see Dr. Elease Hashimoto tomorrow at 4:30pm.

## 2016-03-26 NOTE — Telephone Encounter (Signed)
Please attempt to follow up with patient about injury and if warrants appointment

## 2016-03-26 NOTE — Telephone Encounter (Signed)
[facility] - Client [facility]  Patient Name: [name]  DOB: [birth year]    Initial Comment Caller states c/o foot injury.    Nurse Assessment      Guidelines    Guideline Title Affirmed Question Affirmed Notes       Final Disposition User        Comments  Nurse was unable to reach patient advised to call back to office if needs further assistance.

## 2016-03-27 ENCOUNTER — Ambulatory Visit (INDEPENDENT_AMBULATORY_CARE_PROVIDER_SITE_OTHER): Payer: Medicare Other | Admitting: Family Medicine

## 2016-03-27 VITALS — BP 144/100 | HR 85 | Temp 98.3°F | Ht 67.0 in | Wt 175.9 lb

## 2016-03-27 DIAGNOSIS — M79671 Pain in right foot: Secondary | ICD-10-CM

## 2016-03-27 NOTE — Progress Notes (Signed)
Subjective:     Patient ID: [name], female   DOB: Oct 27, 1950, 65 y.o.   MRN: [id number]  HPI Patient seen with right foot pain. First noted onset around past Saturday. She was walking in an old pair boots that she does not normally walk in. Denies any injury. She has pain with pressure such as walking but not with sitting. She has not noted any edema. She describes the pain as sharp and somewhere near the base of the fourth metatarsal. No bruising. She had similar pain several years ago with stress fracture in the left foot. She is on her feet much of the day but does not do a lot of walking. No known history of osteoporosis.  Past Medical History:  Diagnosis Date  . 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 Hypertension   . Skin cancer    skin cancers removed   Past Surgical History:  Procedure Laterality Date  . ABDOMINAL HYSTERECTOMY  2007  . BREAST LUMPECTOMY WITH RADIOACTIVE SEED LOCALIZATION Right 02/18/2014   Procedure: RIGHT BREAST DUCT EXCISION AND RADIOACTIVE SEED LOCALIZED BREAST LUMPECTOMY;  Surgeon: Jackolyn Confer, MD;  Location: [facility];  Service: General;  Laterality: Right;  . BUNIONECTOMY     left  . DILATION AND CURETTAGE OF UTERUS    . DVT  2009   thrombectomy lt ll  . right shoulder  2010   RCR-shoulder  . THROMBECTOMY  2009   left common iliac vein-stent  . TUBAL LIGATION      reports that she has never smoked. She has never used smokeless tobacco. She reports that she drinks alcohol. She reports that she does not use drugs. family history includes Cancer in her mother; Diabetes in her brother, brother, brother, brother, brother, and mother; Heart disease in her father. Allergies  Allergen Reactions  . Sulfa Antibiotics Rash     Review of Systems  Neurological: Negative for weakness and numbness.       Objective:   Physical Exam  Constitutional: She appears well-developed and well-nourished.  Cardiovascular:  Normal rate and regular rhythm.   Pulmonary/Chest: Effort normal and breath sounds normal. No respiratory distress. She has no wheezes. She has no rales.  Musculoskeletal:  Right foot-tenderness of the base of the fourth metatarsal. She has minimal nodular density to palpation in that region which is not mobile. No ecchymosis. Good distal foot pulses. No ankle tenderness.       Assessment:     Right foot pain at the base of the fourth metatarsal. Rule out stress fracture    Plan:     -Obtain x-rays of the right foot -Consider well cushioned shoe with good firm sole -Continue meloxicam  Eulas Post MD [facility]

## 2016-03-27 NOTE — Progress Notes (Signed)
Pre visit review using our clinic review tool, if applicable. No additional management support is needed unless otherwise documented below in the visit note. 

## 2016-03-28 ENCOUNTER — Ambulatory Visit (INDEPENDENT_AMBULATORY_CARE_PROVIDER_SITE_OTHER)
Admission: RE | Admit: 2016-03-28 | Discharge: 2016-03-28 | Disposition: A | Discharge status: Home / Self Care | Payer: Medicare Other | Source: Ambulatory Visit | Attending: Family Medicine | Admitting: Family Medicine

## 2016-03-28 DIAGNOSIS — M79671 Pain in right foot: Secondary | ICD-10-CM | POA: Diagnosis not present

## 2016-03-28 DIAGNOSIS — M19071 Primary osteoarthritis, right ankle and foot: Secondary | ICD-10-CM | POA: Diagnosis not present

## 2016-04-18 ENCOUNTER — Encounter: Payer: Self-pay | Admitting: Family Medicine

## 2016-04-18 ENCOUNTER — Ambulatory Visit (INDEPENDENT_AMBULATORY_CARE_PROVIDER_SITE_OTHER): Payer: Medicare Other | Admitting: Family Medicine

## 2016-04-18 VITALS — BP 112/78 | HR 88 | Resp 12 | Ht 67.0 in | Wt 172.5 lb

## 2016-04-18 DIAGNOSIS — I10 Essential (primary) hypertension: Secondary | ICD-10-CM

## 2016-04-18 DIAGNOSIS — R05 Cough: Secondary | ICD-10-CM | POA: Diagnosis not present

## 2016-04-18 DIAGNOSIS — K219 Gastro-esophageal reflux disease without esophagitis: Secondary | ICD-10-CM

## 2016-04-18 DIAGNOSIS — R053 Chronic cough: Secondary | ICD-10-CM

## 2016-04-18 MED ORDER — LOSARTAN POTASSIUM 25 MG PO TABS: 25.0000 mg | ORAL_TABLET | Freq: Every day | ORAL | 2 refills | Status: DC

## 2016-04-18 NOTE — Progress Notes (Signed)
Pre visit review using our clinic review tool, if applicable. No additional management support is needed unless otherwise documented below in the visit note. 

## 2016-04-18 NOTE — Progress Notes (Signed)
HPI:   [name] is a 66 y.o. female, who is here today to follow on visit of 10/27/15, when she was c/o persistent cough and GERD like symptoms as well as bilateral knee pain. I saw her for an acute visit on 03/22/16, when she was concerned about oral lesion, which has already resolved.She has no concerns in this regard today.  Cough: Persistent for year and stable overall.  During the past few days she has more clear sputum. She denies chills, fatigue, fever, or sick contact. In 10/2015 recommended Zantac to treat GERD symptoms but she did not take medication. Intermittent heartburn, usually associated with certain food intake. She has not noted abdominal pain, nausea, vomiting, or changes in bowel habits.  Hx of allergic rhinitis, Flonase nasal spray did not help with cough.  She is also on Lisinopril, which she did not want to discontinued.  She denies associated wheezing, dyspnea, or abnormal wt loss. No Hx of tobacco use.   Hypertension:   On Lisinopril 10 mg daily, which she has tolerated well overall.   She has not noted unusual headache, visual changes, exertional chest pain, dyspnea,  focal weakness, or edema.   Lab Results  Component Value Date   CREATININE 0.81 10/27/2015   BUN 19 10/27/2015   NA 144 10/27/2015   K 4.4 10/27/2015   CL 108 10/27/2015   CO2 26 10/27/2015    She also has Hx of knee OA, she is on Mobic and following with ortho. Reporting improvement of knee pain.     Review of Systems  Constitutional: Negative for activity change, appetite change, fatigue, fever and unexpected weight change.  HENT: Positive for postnasal drip and rhinorrhea. Negative for mouth sores, nosebleeds and trouble swallowing.   Eyes: Negative for redness and visual disturbance.  Respiratory: Positive for cough. Negative for shortness of breath and wheezing.   Cardiovascular: Negative for chest pain, palpitations and leg swelling.  Gastrointestinal:  Negative for abdominal pain, nausea and vomiting.       Negative for changes in bowel habits.  Genitourinary: Negative for decreased urine volume, difficulty urinating, dysuria and hematuria.  Skin: Negative for rash.  Allergic/Immunologic: Positive for environmental allergies.  Neurological: Negative for syncope, weakness and headaches.  Psychiatric/Behavioral: Negative for confusion. The patient is nervous/anxious.       Current Outpatient Prescriptions on File Prior to Visit  Medication Sig Dispense Refill  . Calcium Carb-Cholecalciferol (CALCIUM 600 + D PO) Take by mouth.    . meloxicam (MOBIC) 15 MG tablet Take 1 tablet by mouth daily.    . Multiple Vitamins-Minerals (MULTIVITAMIN WITH MINERALS) tablet Take 1 tablet by mouth daily.    . vitamin B-12 (CYANOCOBALAMIN) 1000 MCG tablet Take 1,000 mcg by mouth daily.     No current facility-administered medications on file prior to visit.      Past Medical History:  Diagnosis Date  . 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 Hypertension   . Skin cancer    skin cancers removed   Allergies  Allergen Reactions  . Sulfa Antibiotics Rash    Social History   Social History  . Marital status: Divorced    Spouse name: N/A  . Number of children: N/A  . Years of education: N/A   Social History Main Topics  . Smoking status: Never Smoker  . Smokeless tobacco: Never Used  . Alcohol use Yes     Comment: rarely  .  Drug use: No  . Sexual activity: Yes    Birth control/ protection: Surgical   Other Topics Concern  . None   Social History Narrative  . None    Vitals:   04/18/16 0814  BP: 112/78  Pulse: 88  Resp: 12   O2 sat at RA 97%.  Body mass index is 27.02 kg/m.     Physical Exam  Nursing note and vitals reviewed. Constitutional: She is oriented to person, place, and time. She appears well-developed. No distress.  HENT:  Head: Atraumatic.  Mouth/Throat: Oropharynx is clear and moist and  mucous membranes are normal.  Eyes: Conjunctivae and EOM are normal. Pupils are equal, round, and reactive to light.  Neck: No JVD present.  Cardiovascular: Normal rate and regular rhythm.   No murmur heard. Respiratory: Effort normal and breath sounds normal. No respiratory distress.  GI: Soft. She exhibits no mass. There is no hepatomegaly. There is no tenderness.  Musculoskeletal: She exhibits edema (trace pitting edema LE bilateral.). She exhibits no tenderness.  Lymphadenopathy:    She has no cervical adenopathy.       Right: No supraclavicular adenopathy present.       Left: No supraclavicular adenopathy present.  Neurological: She is alert and oriented to person, place, and time. She has normal strength. Coordination normal.  Skin: Skin is warm. No erythema.  Psychiatric: She has a normal mood and affect.  Well groomed, good eye contact.      ASSESSMENT AND PLAN:     Yvetta was seen today for follow-up.  Diagnoses and all orders for this visit:   Essential hypertension, benign  Adequately controlled. Because Lisinopril may be aggravating or causing cough, she agrees with changing to Cozaar 25 mg daily. Monitor BP at home and BP check in 5-6 weeks. DASH diet recommended. Eye exam periodically. F/U in 4 months, before if needed.  -     losartan (COZAAR) 25 MG tablet; Take 1 tablet (25 mg total) by mouth daily.   Gastroesophageal reflux disease without esophagitis  She is afraid of PPI possible side effects, did not try Zantac either. For now non pharmacologic treatment recommended. This problems could be aggravating cough. We discussed adverse effects of GERD on esophagus   Cough, persistent  We discussed possible causes. CXR 11/2015 showed no active cardiopulmonary disease. She agrees with stopping Lisinopril. She is not interested in pulmonologist referral.     -Today we reviewed and discussed Living Will form she brought with her today. She needs to  sign it and notarize it. -Refused flu shot. -Medicare Preventive visit will be scheduled with Ms Manuela Schwartz.     -Ms. Genoveva Ill was advised to return sooner than planned today if new concerns arise.       Ori Trejos G. Martinique, MD  [facility]. [place] office.

## 2016-04-18 NOTE — Patient Instructions (Addendum)
A few things to remember from today's visit:   Essential hypertension, benign - Plan: losartan (COZAAR) 25 MG tablet  Gastroesophageal reflux disease without esophagitis  Cough, persistent    Avoid foods that make your symptoms worse, for example coffee, chocolate,pepermeint,alcohol, and greasy food. Raising the head of your bed about 6 inches may help with nocturnal symptoms.   Avoid lying down for 3 hours after eating.  Instead 3 large meals daily try small and more frequent meals during the day.    You should be evaluated immediately if bloody vomiting, bloody stools, black stools (like tar), difficulty swallowing, food gets stuck on the way down or choking when eating. Abnormal weight loss or severe abdominal pain.   Today blood pressure med changed to Cozaar because cough.  Goal < 140/90  Please be sure medication list is accurate. If a new problem present, please set up appointment sooner than planned today.

## 2016-05-14 DIAGNOSIS — D485 Neoplasm of uncertain behavior of skin: Secondary | ICD-10-CM | POA: Diagnosis not present

## 2016-05-14 DIAGNOSIS — Z86018 Personal history of other benign neoplasm: Secondary | ICD-10-CM | POA: Diagnosis not present

## 2016-05-14 DIAGNOSIS — L814 Other melanin hyperpigmentation: Secondary | ICD-10-CM | POA: Diagnosis not present

## 2016-05-14 DIAGNOSIS — D1801 Hemangioma of skin and subcutaneous tissue: Secondary | ICD-10-CM | POA: Diagnosis not present

## 2016-05-14 DIAGNOSIS — L821 Other seborrheic keratosis: Secondary | ICD-10-CM | POA: Diagnosis not present

## 2016-05-14 DIAGNOSIS — Z85828 Personal history of other malignant neoplasm of skin: Secondary | ICD-10-CM | POA: Diagnosis not present

## 2016-05-15 DIAGNOSIS — L821 Other seborrheic keratosis: Secondary | ICD-10-CM | POA: Diagnosis not present

## 2016-05-15 DIAGNOSIS — L814 Other melanin hyperpigmentation: Secondary | ICD-10-CM | POA: Diagnosis not present

## 2016-05-21 DIAGNOSIS — J111 Influenza due to unidentified influenza virus with other respiratory manifestations: Secondary | ICD-10-CM | POA: Diagnosis not present

## 2016-05-30 ENCOUNTER — Ambulatory Visit: Payer: Medicare Other

## 2016-05-30 VITALS — BP 130/84

## 2016-05-30 DIAGNOSIS — I10 Essential (primary) hypertension: Secondary | ICD-10-CM

## 2016-05-30 NOTE — Progress Notes (Signed)
Patient came in today for a blood pressure check. When checked manually, the blood pressure was 130/84. When checked with patient's monitor, it was 133/89. Patient's readings at home are consistent with a few high readings when new medication was first started, but overall in the same range of readings.

## 2016-07-24 ENCOUNTER — Other Ambulatory Visit: Payer: Self-pay | Admitting: Family Medicine

## 2016-07-24 DIAGNOSIS — I10 Essential (primary) hypertension: Secondary | ICD-10-CM

## 2016-08-14 ENCOUNTER — Ambulatory Visit: Payer: Medicare Other

## 2016-08-15 NOTE — Progress Notes (Signed)
Subjective:   [name] is a 66 y.o. female who presents for an Initial Medicare Annual Wellness Visit.  The Patient was informed that the wellness visit is to identify future health risk and educate and initiate measures that can reduce risk for increased disease through the lifespan.    NO ROS; Medicare Wellness Visit  Medicare started 01/2015  Lives alone Son lives with her    Describes health as good, fair or great? good New issues - has a cough in the am coughs up flim Has discussed with Dr. Martinique and she changed medicine  Post nasal drip   Preventive Screening -Counseling & Management  Mammogram; hx breast lumpectomy 02/2014  Mammogram 05/2015; has not rec'd a notice  Bone Density; 05/2015  -0.8 Takes calcium and recommended weight bearing exercise    Smoking history - never smoked  Smokeless tobacco  Second Hand Smoke status; No Smokers in the home Son smokes but not around her  ETOH - rare  Medication adherence or issues? none  RISK FACTORS Diet BMI 27.3 Tries to eat breakfast lunch and supper Tuna salad Cooks clean   Regular exercise  Does walk at the school;  On her feet a lot in the afternoon [place] it not safe to walk [facility] in the park that [place] miles  Mows her own yard    Cardiac Risk Factors:  Advanced aged > 55 in men; >65 in women Hyperlipidemia - chol 183; HDL 70; LDL 98 and trig 71 Diabetes 66 Family History - DM, cancer, HD; 5 brothers with DM  Obesity  Fall risk no Given education on "Fall Prevention in the Home" for more safety tips the patient can apply as appropriate.    Mobility of Functional changes this year? no Safety; community, wears sunscreen, safe place for firearms; Motor vehicle accidents;   Mental Health:  Any emotional problems? Anxious, depressed, irritable, sad or blue? no Denies feeling depressed or hopeless; voices pleasure in daily life How many social activities have you  been engaged in within the last 2 weeks? no    Visual Acuity Screening   Right eye Left eye Both eyes  Without correction:     With correction: 20/60 20/80 20/40    Comments: Vision checks  To scheduled  Dr. At [facility] Screening Comments: Hearing issues;  No issues did have hearing checked x 2     Activities of Daily Living - See functional screen   Cognitive testing; Ad8 score; 0 or less than 2  MMSE deferred or completed if AD8 + 2 issues  Advanced Directives completed  Patient Care Team: Betty G Martinique, MD as PCP - General (Family Medicine)   There is no immunization history on file for this patient. Required Immunizations needed today  Screening test up to date or reviewed for plan of completion Health Maintenance Due  Topic Date Due  . Hepatitis C Screening  March 25, 1951  . COLONOSCOPY  01/25/2000    Medicare now request all "baby boomers" test for possible exposure to Hepatitis C. Many may have been exposed due to dental work, tatoo's, vaccinations when young. The Hepatitis C virus is dormant for many years and then sometimes will cause liver cancer. If you gave blood in the past 15 years, you were most likely checked for Hep C. If you rec'd blood; you may want to consider testing or if you are high risk for any other reason.  Declines Hep c  Colonoscopy  due 01/2000 had one;  Probably had one less than 10 years ago  States she did the cologuard; prior to DR. Martinique Will defer for now but she will get date  PCV 13 - declines   Cardiac Risk Factors include: advanced age (>57men, >66 women);family history of premature cardiovascular disease;hypertension     Objective:    Today's Vitals   08/16/16 0814  BP: (!) 146/86  Pulse: 70  SpO2: 98%  Weight: 174 lb (78.9 kg)  Height: 5\' 7"  (1.702 m)   Body mass index is 27.25 kg/m.   Current Medications (verified) Outpatient Encounter Prescriptions as of 08/16/2016  Medication Sig  . Calcium  Carb-Cholecalciferol (CALCIUM 600 + D PO) Take by mouth.  . losartan (COZAAR) 25 MG tablet TAKE ONE TABLET BY MOUTH ONCE DAILY  . meloxicam (MOBIC) 15 MG tablet Take 1 tablet by mouth daily.  . Multiple Vitamins-Minerals (MULTIVITAMIN WITH MINERALS) tablet Take 1 tablet by mouth daily.  . vitamin B-12 (CYANOCOBALAMIN) 1000 MCG tablet Take 1,000 mcg by mouth daily.   No facility-administered encounter medications on file as of 08/16/2016.     Allergies (verified) Sulfa antibiotics   History: Past Medical History:  Diagnosis Date  . 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 Hypertension   . Skin cancer    skin cancers removed   Past Surgical History:  Procedure Laterality Date  . ABDOMINAL HYSTERECTOMY  2007  . BREAST LUMPECTOMY WITH RADIOACTIVE SEED LOCALIZATION Right 02/18/2014   Procedure: RIGHT BREAST DUCT EXCISION AND RADIOACTIVE SEED LOCALIZED BREAST LUMPECTOMY;  Surgeon: Jackolyn Confer, MD;  Location: [facility];  Service: General;  Laterality: Right;  . BUNIONECTOMY     left  . DILATION AND CURETTAGE OF UTERUS    . DVT  2009   thrombectomy lt ll  . right shoulder  2010   RCR-shoulder  . THROMBECTOMY  2009   left common iliac vein-stent  . TUBAL LIGATION     Family History  Problem Relation Age of Onset  . Diabetes Mother   . Cancer Mother     multiple myloma  . Heart disease Father   . Diabetes Brother   . Diabetes Brother   . Diabetes Brother   . Diabetes Brother   . Diabetes Brother    Social History   Occupational History  . Not on file.   Social History Main Topics  . Smoking status: Never Smoker  . Smokeless tobacco: Never Used  . Alcohol use Yes     Comment: rarely  . Drug use: No  . Sexual activity: Yes    Birth control/ protection: Surgical    Tobacco Counseling Counseling given: Yes   Activities of Daily Living In your present state of health, do you have any difficulty performing the following activities:  08/16/2016  Hearing? N  Vision? N  Difficulty concentrating or making decisions? N  Walking or climbing stairs? N  Dressing or bathing? N  Doing errands, shopping? N  Preparing Food and eating ? N  Using the Toilet? N  In the past six months, have you accidently leaked urine? N  Do you have problems with loss of bowel control? N  Managing your Medications? N  Managing your Finances? N  Housekeeping or managing your Housekeeping? N  Some recent data might be hidden    Immunizations and Health Maintenance  There is no immunization history on file for this patient. Health  Maintenance Due  Topic Date Due  . Hepatitis C Screening  1951/02/26  . COLONOSCOPY  01/25/2000    Patient Care Team: Betty G Martinique, MD as PCP - General (Family Medicine)  Indicate any recent Medical Services you may have received from other than [facility] providers in the past year (date may be approximate).     Assessment:   This is a routine wellness examination for [place].   Hearing/Vision screen  Visual Acuity Screening   Right eye Left eye Both eyes  Without correction:     With correction: 20/60 20/80 20/40    Comments: Vision checks  To scheduled  Dr. At [employer] Screening Comments: Hearing issues;  No issues did have hearing checked x 2    Dietary issues and exercise activities discussed: Current Exercise Habits: Home exercise routine, Type of exercise: walking, Time (Minutes): 30, Frequency (Times/Week): 5, Weekly Exercise (Minutes/Week): 150, Intensity: Moderate  Goals    . Exercise 150 minutes per week (moderate activity)          Walking 5 times a week To Feel better and have more energy  Consider pickle ball  Will take a day off next year      Depression Screen PHQ 2/9 Scores 08/16/2016 08/16/2016  PHQ - 2 Score 0 0    Fall Risk Fall Risk  08/16/2016  Falls in the past year? No    Cognitive Function: MMSE - Mini Mental State Exam 08/16/2016  Not completed: (No Data)          Screening Tests Health Maintenance  Topic Date Due  . Hepatitis C Screening  05-28-1950  . COLONOSCOPY  01/25/2000  . PNA vac Low Risk Adult (1 of 2 - PCV13) 08/14/2017 (Originally 01/25/2015)  . INFLUENZA VACCINE  11/14/2016  . MAMMOGRAM  05/30/2017  . TETANUS/TDAP  04/28/2025  . 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 To see Dr. Martinique today   Nurse Concerns;  Next PCP apt today    I have personally reviewed and noted the following in the patient's chart:   . Medical and social history . Use of alcohol, tobacco or illicit drugs  . Current medications and supplements . Functional ability and status . Nutritional status . Physical activity . Advanced directives . List of other physicians . Hospitalizations, surgeries, and ER visits in previous 12 months . Vitals . Screenings to include cognitive, depression, and falls . Referrals and appointments  In addition, I have reviewed and discussed with patient certain preventive protocols, quality metrics, and best practice recommendations. A written personalized care plan for preventive services as well as general preventive health recommendations were provided to patient.     Wynetta Fines, RN   08/16/2016

## 2016-08-16 ENCOUNTER — Ambulatory Visit (INDEPENDENT_AMBULATORY_CARE_PROVIDER_SITE_OTHER): Payer: Medicare Other | Admitting: Family Medicine

## 2016-08-16 ENCOUNTER — Ambulatory Visit: Payer: Medicare Other

## 2016-08-16 ENCOUNTER — Encounter: Payer: Self-pay | Admitting: Family Medicine

## 2016-08-16 VITALS — BP 146/86 | HR 70 | Ht 67.0 in | Wt 174.0 lb

## 2016-08-16 VITALS — BP 130/85 | HR 70 | Resp 12 | Ht 67.0 in | Wt 174.0 lb

## 2016-08-16 DIAGNOSIS — Z0289 Encounter for other administrative examinations: Secondary | ICD-10-CM

## 2016-08-16 DIAGNOSIS — Z021 Encounter for pre-employment examination: Secondary | ICD-10-CM | POA: Diagnosis not present

## 2016-08-16 DIAGNOSIS — Z0000 Encounter for general adult medical examination without abnormal findings: Secondary | ICD-10-CM

## 2016-08-16 NOTE — Patient Instructions (Addendum)
[name] , Thank you for taking time to come for your Medicare Wellness Visit. I appreciate your ongoing commitment to your health goals. Please review the following plan we discussed and let me know if I can assist you in the future.   Will bring a copy of Advanced Directive   To schedule eye exam   Will determine a date for the cologuard   You can google hep c with the Baby boomers   These are the goals we discussed: Goals    . Exercise 150 minutes per week (moderate activity)          Walking 5 times a week To Feel better and have more energy  Consider pickle ball  Will take a day off next year       This is a list of the screening recommended for you and due dates:  Health Maintenance  Topic Date Due  .  Hepatitis C: One time screening is recommended by Center for Disease Control  (CDC) for  adults born from [birth year] through 1966.   [birth year]  . Colon Cancer Screening  01/25/2000  . Pneumonia vaccines (1 of 2 - PCV13) 01/25/2015  . Flu Shot  11/14/2016  . Mammogram  05/30/2017  . Tetanus Vaccine  04/28/2025  . DEXA scan (bone density measurement)  Completed    Prevention of falls: Remove rugs or any tripping hazards in the home Use Non slip mats in bathtubs and showers Placing grab bars next to the toilet and or shower Placing handrails on both sides of the stair way Adding extra lighting in the home.   Personal safety issues reviewed:  1. Consider starting a community watch program per [facility] 2.  Changes batteries is smoke detector and/or carbon monoxide detector  3.  If you have firearms; keep them in a safe place 4.  Wear protection when in the sun; Always wear sunscreen or a hat; It is good to have your doctor check your skin annually or review any new areas of concern 5. Driving safety; Keep in the right lane; stay 3 car lengths behind the car in front of you on the highway; look 3 times prior to pulling out; carry your cell phone everywhere you  go!    Learn about the Yellow Dot program:  The program allows first responders at your emergency to have access to who your physician is, as well as your medications and medical conditions.  Citizens requesting the Yellow Dot Packages should contact Master Corporal Nunzio Cobbs at the [facility] [phone] for the first week of the program and beginning the week after Easter citizens should contact their Scientist, physiological.  Health Maintenance, Female Adopting a healthy lifestyle and getting preventive care can go a long way to promote health and wellness. Talk with your health care provider about what schedule of regular examinations is right for you. This is a good chance for you to check in with your provider about disease prevention and staying healthy. In between checkups, there are plenty of things you can do on your own. Experts have done a lot of research about which lifestyle changes and preventive measures are most likely to keep you healthy. Ask your health care provider for more information. Weight and diet Eat a healthy diet  Be sure to include plenty of vegetables, fruits, low-fat dairy products, and lean protein.  Do not eat a lot of foods high in solid fats, added sugars, or salt.  Get regular exercise. This is one of the most important things you can do for your health.  Most adults should exercise for at least 150 minutes each week. The exercise should increase your heart rate and make you sweat (moderate-intensity exercise).  Most adults should also do strengthening exercises at least twice a week. This is in addition to the moderate-intensity exercise. Maintain a healthy weight  Body mass index (BMI) is a measurement that can be used to identify possible weight problems. It estimates body fat based on height and weight. Your health care provider can help determine your BMI and help you achieve or maintain a healthy weight.  For  females 1 years of age and older:  A BMI below 18.5 is considered underweight.  A BMI of 18.5 to 24.9 is normal.  A BMI of 25 to 29.9 is considered overweight.  A BMI of 30 and above is considered obese. Watch levels of cholesterol and blood lipids  You should start having your blood tested for lipids and cholesterol at 67 years of age, then have this test every 5 years.  You may need to have your cholesterol levels checked more often if:  Your lipid or cholesterol levels are high.  You are older than 67 years of age.  You are at high risk for heart disease. Cancer screening Lung Cancer  Lung cancer screening is recommended for adults 91-80 years old who are at high risk for lung cancer because of a history of smoking.  A yearly low-dose CT scan of the lungs is recommended for people who:  Currently smoke.  Have quit within the past 15 years.  Have at least a 30-pack-year history of smoking. A pack year is smoking an average of one pack of cigarettes a day for 1 year.  Yearly screening should continue until it has been 15 years since you quit.  Yearly screening should stop if you develop a health problem that would prevent you from having lung cancer treatment. Breast Cancer  Practice breast self-awareness. This means understanding how your breasts normally appear and feel.  It also means doing regular breast self-exams. Let your health care provider know about any changes, no matter how small.  If you are in your 20s or 30s, you should have a clinical breast exam (CBE) by a health care provider every 1-3 years as part of a regular health exam.  If you are 48 or older, have a CBE every year. Also consider having a breast X-ray (mammogram) every year.  If you have a family history of breast cancer, talk to your health care provider about genetic screening.  If you are at high risk for breast cancer, talk to your health care provider about having an MRI and a mammogram  every year.  Breast cancer gene (BRCA) assessment is recommended for women who have family members with BRCA-related cancers. BRCA-related cancers include:  Breast.  Ovarian.  Tubal.  Peritoneal cancers.  Results of the assessment will determine the need for genetic counseling and BRCA1 and BRCA2 testing. Cervical Cancer  Your health care provider may recommend that you be screened regularly for cancer of the pelvic organs (ovaries, uterus, and vagina). This screening involves a pelvic examination, including checking for microscopic changes to the surface of your cervix (Pap test). You may be encouraged to have this screening done every 3 years, beginning at age 52.  For women ages 23-65, health care providers may recommend pelvic exams and Pap testing every 3 years,  or they may recommend the Pap and pelvic exam, combined with testing for human papilloma virus (HPV), every 5 years. Some types of HPV increase your risk of cervical cancer. Testing for HPV may also be done on women of any age with unclear Pap test results.  Other health care providers may not recommend any screening for nonpregnant women who are considered low risk for pelvic cancer and who do not have symptoms. Ask your health care provider if a screening pelvic exam is right for you.  If you have had past treatment for cervical cancer or a condition that could lead to cancer, you need Pap tests and screening for cancer for at least 20 years after your treatment. If Pap tests have been discontinued, your risk factors (such as having a new sexual partner) need to be reassessed to determine if screening should resume. Some women have medical problems that increase the chance of getting cervical cancer. In these cases, your health care provider may recommend more frequent screening and Pap tests. Colorectal Cancer  This type of cancer can be detected and often prevented.  Routine colorectal cancer screening usually begins at 67  years of age and continues through 67 years of age.  Your health care provider may recommend screening at an earlier age if you have risk factors for colon cancer.  Your health care provider may also recommend using home test kits to check for hidden blood in the stool.  A small camera at the end of a tube can be used to examine your colon directly (sigmoidoscopy or colonoscopy). This is done to check for the earliest forms of colorectal cancer.  Routine screening usually begins at age 61.  Direct examination of the colon should be repeated every 5-10 years through 67 years of age. However, you may need to be screened more often if early forms of precancerous polyps or small growths are found. Skin Cancer  Check your skin from head to toe regularly.  Tell your health care provider about any new moles or changes in moles, especially if there is a change in a mole's shape or color.  Also tell your health care provider if you have a mole that is larger than the size of a pencil eraser.  Always use sunscreen. Apply sunscreen liberally and repeatedly throughout the day.  Protect yourself by wearing long sleeves, pants, a wide-brimmed hat, and sunglasses whenever you are outside. Heart disease, diabetes, and high blood pressure  High blood pressure causes heart disease and increases the risk of stroke. High blood pressure is more likely to develop in:  People who have blood pressure in the high end of the normal range (130-139/85-89 mm Hg).  People who are overweight or obese.  People who are African American.  If you are 92-41 years of age, have your blood pressure checked every 3-5 years. If you are 67 years of age or older, have your blood pressure checked every year. You should have your blood pressure measured twice-once when you are at a hospital or clinic, and once when you are not at a hospital or clinic. Record the average of the two measurements. To check your blood pressure when  you are not at a hospital or clinic, you can use:  An automated blood pressure machine at a pharmacy.  A home blood pressure monitor.  If you are between 26 years and 94 years old, ask your health care provider if you should take aspirin to prevent strokes.  Have regular  diabetes screenings. This involves taking a blood sample to check your fasting blood sugar level.  If you are at a normal weight and have a low risk for diabetes, have this test once every three years after 67 years of age.  If you are overweight and have a high risk for diabetes, consider being tested at a younger age or more often. Preventing infection Hepatitis B  If you have a higher risk for hepatitis B, you should be screened for this virus. You are considered at high risk for hepatitis B if:  You were born in a country where hepatitis B is common. Ask your health care provider which countries are considered high risk.  Your parents were born in a high-risk country, and you have not been immunized against hepatitis B (hepatitis B vaccine).  You have HIV or AIDS.  You use needles to inject street drugs.  You live with someone who has hepatitis B.  You have had sex with someone who has hepatitis B.  You get hemodialysis treatment.  You take certain medicines for conditions, including cancer, organ transplantation, and autoimmune conditions. Hepatitis C  Blood testing is recommended for:  Everyone born from [birth year] through 1966.  Anyone with known risk factors for hepatitis C. Sexually transmitted infections (STIs)  You should be screened for sexually transmitted infections (STIs) including gonorrhea and chlamydia if:  You are sexually active and are younger than 67 years of age.  You are older than 67 years of age and your health care provider tells you that you are at risk for this type of infection.  Your sexual activity has changed since you were last screened and you are at an increased risk for  chlamydia or gonorrhea. Ask your health care provider if you are at risk.  If you do not have HIV, but are at risk, it may be recommended that you take a prescription medicine daily to prevent HIV infection. This is called pre-exposure prophylaxis (PrEP). You are considered at risk if:  You are sexually active and do not regularly use condoms or know the HIV status of your partner(s).  You take drugs by injection.  You are sexually active with a partner who has HIV. Talk with your health care provider about whether you are at high risk of being infected with HIV. If you choose to begin PrEP, you should first be tested for HIV. You should then be tested every 3 months for as long as you are taking PrEP. Pregnancy  If you are premenopausal and you may become pregnant, ask your health care provider about preconception counseling.  If you may become pregnant, take 400 to 800 micrograms (mcg) of folic acid every day.  If you want to prevent pregnancy, talk to your health care provider about birth control (contraception). Osteoporosis and menopause  Osteoporosis is a disease in which the bones lose minerals and strength with aging. This can result in serious bone fractures. Your risk for osteoporosis can be identified using a bone density scan.  If you are 75 years of age or older, or if you are at risk for osteoporosis and fractures, ask your health care provider if you should be screened.  Ask your health care provider whether you should take a calcium or vitamin D supplement to lower your risk for osteoporosis.  Menopause may have certain physical symptoms and risks.  Hormone replacement therapy may reduce some of these symptoms and risks. Talk to your health care provider about whether hormone  replacement therapy is right for you. Follow these instructions at home:  Schedule regular health, dental, and eye exams.  Stay current with your immunizations.  Do not use any tobacco products  including cigarettes, chewing tobacco, or electronic cigarettes.  If you are pregnant, do not drink alcohol.  If you are breastfeeding, limit how much and how often you drink alcohol.  Limit alcohol intake to no more than 1 drink per day for nonpregnant women. One drink equals 12 ounces of beer, 5 ounces of wine, or 1 ounces of hard liquor.  Do not use street drugs.  Do not share needles.  Ask your health care provider for help if you need support or information about quitting drugs.  Tell your health care provider if you often feel depressed.  Tell your health care provider if you have ever been abused or do not feel safe at home. This information is not intended to replace advice given to you by your health care provider. Make sure you discuss any questions you have with your health care provider. Document Released: 10/16/2010 Document Revised: 09/08/2015 Document Reviewed: 01/04/2015 Elsevier Interactive Patient Education  2017 [place] Prevention in the Home Falls can cause injuries and can affect people from all age groups. There are many simple things that you can do to make your home safe and to help prevent falls. What can I do on the outside of my home?  Regularly repair the edges of walkways and driveways and fix any cracks.  Remove high doorway thresholds.  Trim any shrubbery on the main path into your home.  Use bright outdoor lighting.  Clear walkways of debris and clutter, including tools and rocks.  Regularly check that handrails are securely fastened and in good repair. Both sides of any steps should have handrails.  Install guardrails along the edges of any raised decks or porches.  Have leaves, snow, and ice cleared regularly.  Use sand or salt on walkways during winter months.  In the garage, clean up any spills right away, including grease or oil spills. What can I do in the bathroom?  Use night lights.  Install grab bars by the  toilet and in the tub and shower. Do not use towel bars as grab bars.  Use non-skid mats or decals on the floor of the tub or shower.  If you need to sit down while you are in the shower, use a plastic, non-slip stool.  Keep the floor dry. Immediately clean up any water that spills on the floor.  Remove soap buildup in the tub or shower on a regular basis.  Attach bath mats securely with double-sided non-slip rug tape.  Remove throw rugs and other tripping hazards from the floor. What can I do in the bedroom?  Use night lights.  Make sure that a bedside light is easy to reach.  Do not use oversized bedding that drapes onto the floor.  Have a firm chair that has side arms to use for getting dressed.  Remove throw rugs and other tripping hazards from the floor. What can I do in the kitchen?  Clean up any spills right away.  Avoid walking on wet floors.  Place frequently used items in easy-to-reach places.  If you need to reach for something above you, use a sturdy step stool that has a grab bar.  Keep electrical cables out of the way.  Do not use floor polish or wax that makes floors slippery. If  you have to use wax, make sure that it is non-skid floor wax.  Remove throw rugs and other tripping hazards from the floor. What can I do in the stairways?  Do not leave any items on the stairs.  Make sure that there are handrails on both sides of the stairs. Fix handrails that are broken or loose. Make sure that handrails are as long as the stairways.  Check any carpeting to make sure that it is firmly attached to the stairs. Fix any carpet that is loose or worn.  Avoid having throw rugs at the top or bottom of stairways, or secure the rugs with carpet tape to prevent them from moving.  Make sure that you have a light switch at the top of the stairs and the bottom of the stairs. If you do not have them, have them installed. What are some other fall prevention tips?  Wear  closed-toe shoes that fit well and support your feet. Wear shoes that have rubber soles or low heels.  When you use a stepladder, make sure that it is completely opened and that the sides are firmly locked. Have someone hold the ladder while you are using it. Do not climb a closed stepladder.  Add color or contrast paint or tape to grab bars and handrails in your home. Place contrasting color strips on the first and last steps.  Use mobility aids as needed, such as canes, walkers, scooters, and crutches.  Turn on lights if it is dark. Replace any light bulbs that burn out.  Set up furniture so that there are clear paths. Keep the furniture in the same spot.  Fix any uneven floor surfaces.  Choose a carpet design that does not hide the edge of steps of a stairway.  Be aware of any and all pets.  Review your medicines with your healthcare provider. Some medicines can cause dizziness or changes in blood pressure, which increase your risk of falling. Talk with your health care provider about other ways that you can decrease your risk of falls. This may include working with a physical therapist or trainer to improve your strength, balance, and endurance. This information is not intended to replace advice given to you by your health care provider. Make sure you discuss any questions you have with your health care provider. Document Released: 03/23/2002 Document Revised: 08/30/2015 Document Reviewed: 05/07/2014 Elsevier Interactive Patient Education  2017 [employer].

## 2016-08-16 NOTE — Progress Notes (Signed)
HPI:   ACUTE VISIT:  Chief Complaint  Patient presents with  . paperwork    [name] is a 66 y.o. female, who is here today because she needs form fill out. 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  PPD 08/14/16 done at [facility], will be read tomorrow but neg today.  She has no concerns today. She is aware of requirements for position she will be applying for and feels like she can perform job description with no problem.   Review of Systems  Constitutional: Negative for appetite change and fever.  HENT: Negative for mouth sores, nosebleeds and trouble swallowing.   Eyes: Negative for visual disturbance.  Respiratory: Negative for shortness of breath and wheezing.   Cardiovascular: Negative for chest pain, palpitations and leg swelling.  Gastrointestinal: Negative for abdominal pain, nausea and vomiting.       Negative for changes in bowel habits.  Musculoskeletal: Negative for gait problem and myalgias.  Neurological: Negative for syncope, weakness and headaches.  Psychiatric/Behavioral: Negative for behavioral problems and confusion.      Current Outpatient Prescriptions on File Prior to Visit  Medication Sig Dispense Refill  . Calcium Carb-Cholecalciferol (CALCIUM 600 + D PO) Take by mouth.    . losartan (COZAAR) 25 MG tablet TAKE ONE TABLET BY MOUTH ONCE DAILY 90 tablet 1  . meloxicam (MOBIC) 15 MG tablet Take 1 tablet by mouth daily.    . Multiple Vitamins-Minerals (MULTIVITAMIN WITH MINERALS) tablet Take 1 tablet by mouth daily.    . vitamin B-12 (CYANOCOBALAMIN) 1000 MCG tablet Take 1,000 mcg by mouth daily.     No current facility-administered medications on file prior to visit.      Past Medical History:  Diagnosis Date  . 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 Hypertension   . Skin cancer    skin cancers removed   Allergies  Allergen Reactions  . Sulfa Antibiotics Rash    Social  History   Social History  . Marital status: Divorced    Spouse name: N/A  . Number of children: N/A  . Years of education: N/A   Social History Main Topics  . Smoking status: Never Smoker  . Smokeless tobacco: Never Used  . Alcohol use Yes     Comment: rarely  . Drug use: No  . Sexual activity: Yes    Birth control/ protection: Surgical   Other Topics Concern  . None   Social History Narrative  . None    Vitals:   08/16/16 0840  BP: 130/85  Pulse: 70  Resp: 12   Body mass index is 27.25 kg/m.   Physical Exam  Nursing note and vitals reviewed. Constitutional: She is oriented to person, place, and time. She appears well-developed. No distress.  HENT:  Head: Atraumatic.  Mouth/Throat: Oropharynx is clear and moist and mucous membranes are normal.  Eyes: Conjunctivae are normal.  Cardiovascular: Normal rate and regular rhythm.   No murmur heard. Respiratory: Effort normal and breath sounds normal. No respiratory distress.  Musculoskeletal: She exhibits no edema.  No significant deformities or limitations of ROM appreciated.  Neurological: She is alert and oriented to person, place, and time. She has normal strength. Gait normal.  Skin: Skin is warm. No erythema.  Psychiatric: She has a normal mood and affect.  Well groomed, good eye contact.     ASSESSMENT AND PLAN:   [name] was seen today for paperwork.  Diagnoses and all orders for this visit:  Encounter for physical examination related to employment   Form filled out. PPD done and negative so far, it needs to be read tomorrow.     Saba Neuman G. [place], MD  [facility]. [place] office.

## 2016-08-16 NOTE — Progress Notes (Signed)
Pre visit review using our clinic review tool, if applicable. No additional management support is needed unless otherwise documented below in the visit note. 

## 2016-08-18 NOTE — Progress Notes (Signed)
I have reviewed documentation from this visit and I agree with recommendations given.  Betty G. Jordan, MD  [facility]. [place] office.   

## 2016-10-29 DIAGNOSIS — J029 Acute pharyngitis, unspecified: Secondary | ICD-10-CM | POA: Diagnosis not present

## 2016-10-29 DIAGNOSIS — J0140 Acute pansinusitis, unspecified: Secondary | ICD-10-CM | POA: Diagnosis not present

## 2016-11-17 DIAGNOSIS — N390 Urinary tract infection, site not specified: Secondary | ICD-10-CM | POA: Diagnosis not present

## 2016-11-17 DIAGNOSIS — R1032 Left lower quadrant pain: Secondary | ICD-10-CM | POA: Diagnosis not present

## 2016-11-17 DIAGNOSIS — R300 Dysuria: Secondary | ICD-10-CM | POA: Diagnosis not present

## 2016-11-17 DIAGNOSIS — Z87898 Personal history of other specified conditions: Secondary | ICD-10-CM | POA: Diagnosis not present

## 2016-11-17 DIAGNOSIS — R319 Hematuria, unspecified: Secondary | ICD-10-CM | POA: Diagnosis not present

## 2016-11-27 DIAGNOSIS — N390 Urinary tract infection, site not specified: Secondary | ICD-10-CM | POA: Diagnosis not present

## 2016-11-27 DIAGNOSIS — R1084 Generalized abdominal pain: Secondary | ICD-10-CM | POA: Diagnosis not present

## 2016-11-27 DIAGNOSIS — N281 Cyst of kidney, acquired: Secondary | ICD-10-CM | POA: Diagnosis not present

## 2016-11-27 DIAGNOSIS — R300 Dysuria: Secondary | ICD-10-CM | POA: Diagnosis not present

## 2016-11-27 DIAGNOSIS — Z87898 Personal history of other specified conditions: Secondary | ICD-10-CM | POA: Diagnosis not present

## 2016-11-27 DIAGNOSIS — R319 Hematuria, unspecified: Secondary | ICD-10-CM | POA: Diagnosis not present

## 2017-01-19 ENCOUNTER — Other Ambulatory Visit: Payer: Self-pay | Admitting: Family Medicine

## 2017-01-19 DIAGNOSIS — I10 Essential (primary) hypertension: Secondary | ICD-10-CM

## 2017-03-06 DIAGNOSIS — L570 Actinic keratosis: Secondary | ICD-10-CM | POA: Diagnosis not present

## 2017-03-06 DIAGNOSIS — L821 Other seborrheic keratosis: Secondary | ICD-10-CM | POA: Diagnosis not present

## 2017-04-30 ENCOUNTER — Encounter (HOSPITAL_COMMUNITY): Payer: Self-pay

## 2017-05-20 DIAGNOSIS — D1801 Hemangioma of skin and subcutaneous tissue: Secondary | ICD-10-CM | POA: Diagnosis not present

## 2017-05-20 DIAGNOSIS — C44321 Squamous cell carcinoma of skin of nose: Secondary | ICD-10-CM | POA: Diagnosis not present

## 2017-05-20 DIAGNOSIS — L821 Other seborrheic keratosis: Secondary | ICD-10-CM | POA: Diagnosis not present

## 2017-05-20 DIAGNOSIS — L814 Other melanin hyperpigmentation: Secondary | ICD-10-CM | POA: Diagnosis not present

## 2017-05-20 DIAGNOSIS — D225 Melanocytic nevi of trunk: Secondary | ICD-10-CM | POA: Diagnosis not present

## 2017-05-20 DIAGNOSIS — Z85828 Personal history of other malignant neoplasm of skin: Secondary | ICD-10-CM | POA: Diagnosis not present

## 2017-05-20 DIAGNOSIS — Z23 Encounter for immunization: Secondary | ICD-10-CM | POA: Diagnosis not present

## 2017-05-20 DIAGNOSIS — D485 Neoplasm of uncertain behavior of skin: Secondary | ICD-10-CM | POA: Diagnosis not present

## 2017-05-20 DIAGNOSIS — Z86018 Personal history of other benign neoplasm: Secondary | ICD-10-CM | POA: Diagnosis not present

## 2017-06-07 ENCOUNTER — Telehealth: Payer: Self-pay | Admitting: Family Medicine

## 2017-06-07 NOTE — Telephone Encounter (Signed)
Copied from [place] [phone]. Topic: General - Other >> Jun 07, 2017  9:52 AM Synthia Innocent wrote: Reason for CRM: Patient seen last year for Annual Medicare Wellness visit, nothing was ever scheduled for her preventive, Mammogram or Dexa Scan. Would like to move forward with this. Does already have her annual exam scheduled for May, but would like preventive screenings before. Also patient is requesting lab work due to being on BP meds.

## 2017-06-10 NOTE — Telephone Encounter (Signed)
Patient scheduled for follow-up on 06/14/2017.

## 2017-06-10 NOTE — Telephone Encounter (Signed)
Medicare preventive visit is different, Medicare may not cover lab work,so if she has labs for screening, her insurance may no cover for service. I use HTN to order BMP and check for diabetes (FG).  She does not need referral for mammogram. Usually letter is mailed to remind her about mammogram and she can arrange appt. DEXA can be ordered during visit.  She is overdue for HTN follow up.   Thanks, BJ

## 2017-06-14 ENCOUNTER — Ambulatory Visit: Payer: Medicare Other | Admitting: Family Medicine

## 2017-06-19 ENCOUNTER — Ambulatory Visit: Payer: Medicare Other | Admitting: Family Medicine

## 2017-07-12 DIAGNOSIS — C44311 Basal cell carcinoma of skin of nose: Secondary | ICD-10-CM | POA: Diagnosis not present

## 2017-08-20 ENCOUNTER — Ambulatory Visit: Payer: No Typology Code available for payment source

## 2017-08-30 ENCOUNTER — Ambulatory Visit (INDEPENDENT_AMBULATORY_CARE_PROVIDER_SITE_OTHER): Payer: Medicare Other

## 2017-08-30 VITALS — BP 146/80 | Ht 67.0 in | Wt 175.0 lb

## 2017-08-30 DIAGNOSIS — Z0000 Encounter for general adult medical examination without abnormal findings: Secondary | ICD-10-CM | POA: Diagnosis not present

## 2017-08-30 NOTE — Progress Notes (Signed)
I have reviewed documentation from this visit and I agree with recommendations given.  Evora Schechter G. Dariah Mcsorley, MD  [facility]. [place] office.   

## 2017-08-30 NOTE — Patient Instructions (Addendum)
[name] , Thank you for taking time to come for your Medicare Wellness Visit. I appreciate your ongoing commitment to your health goals. Please review the following plan we discussed and let me know if I can assist you in the future.   Had cologuard in 2017; will discuss colonoscopy with Dr. Martinique Your last dexa was 05/2015 and the score (t score) -0.8. You can discuss with Dr. Martinique if this needs to be repeated   For your mammogram, Kayven Aldaco will call the [facility] today If you do not get a call from the [facility] today or early next week, call Marvene Strohm at [phone].  I am in the office T-W and Friday.  If you are not outside x 2 hours a day, you need to supplement with Vit D3 800 to 1000 u    These are the goals we discussed: Goals    . Patient Stated     Will try to start playing pickle ball this year!         This is a list of the screening recommended for you and due dates:  Health Maintenance  Topic Date Due  .  Hepatitis C: One time screening is recommended by Center for Disease Control  (CDC) for  adults born from [birth year] through 1966.   [birth year]  . Colon Cancer Screening  01/25/2000  . Mammogram  05/30/2017  . Pneumonia vaccines (1 of 2 - PCV13) 08/31/2018*  . Flu Shot  11/14/2017  . Tetanus Vaccine  04/28/2025  . DEXA scan (bone density measurement)  Completed  *Topic was postponed. The date shown is not the original due date.     Bone Densitometry Bone densitometry is an imaging test that uses a special X-ray to measure the amount of calcium and other minerals in your bones (bone density). This test is also known as a bone mineral density test or dual-energy X-ray absorptiometry (DXA). The test can measure bone density at your hip and your spine. It is similar to having a regular X-ray. You may have this test to:  Diagnose a condition that causes weak or thin bones (osteoporosis).  Predict your risk of a broken bone (fracture).  Determine how well  osteoporosis treatment is working.  Tell a health care provider about:  Any allergies you have.  All medicines you are taking, including vitamins, herbs, eye drops, creams, and over-the-counter medicines.  Any problems you or family members have had with anesthetic medicines.  Any blood disorders you have.  Any surgeries you have had.  Any medical conditions you have.  Possibility of pregnancy.  Any other medical test you had within the previous 14 days that used contrast material. What are the risks? Generally, this is a safe procedure. However, problems can occur and may include the following:  This test exposes you to a very small amount of radiation.  The risks of radiation exposure may be greater to unborn children.  What happens before the procedure?  Do not take any calcium supplements for 24 hours before having the test. You can otherwise eat and drink what you usually do.  Take off all metal jewelry, eyeglasses, dental appliances, and any other metal objects. What happens during the procedure?  You may lie on an exam table. There will be an X-ray generator below you and an imaging device above you.  Other devices, such as boxes or braces, may be used to position your body properly for the scan.  You will  need to lie still while the machine slowly scans your body.  The images will show up on a computer monitor. What happens after the procedure? You may need more testing at a later time. This information is not intended to replace advice given to you by your health care provider. Make sure you discuss any questions you have with your health care provider. Document Released: 04/24/2004 Document Revised: 09/08/2015 Document Reviewed: 09/10/2013 Elsevier Interactive Patient Education  2018 [place] in the Home Falls can cause injuries. They can happen to people of all ages. There are many things you can do to make your home safe and to help  prevent falls. What can I do on the outside of my home?  Regularly fix the edges of walkways and driveways and fix any cracks.  Remove anything that might make you trip as you walk through a door, such as a raised step or threshold.  Trim any bushes or trees on the path to your home.  Use bright outdoor lighting.  Clear any walking paths of anything that might make someone trip, such as rocks or tools.  Regularly check to see if handrails are loose or broken. Make sure that both sides of any steps have handrails.  Any raised decks and porches should have guardrails on the edges.  Have any leaves, snow, or ice cleared regularly.  Use sand or salt on walking paths during winter.  Clean up any spills in your garage right away. This includes oil or grease spills. What can I do in the bathroom?  Use night lights.  Install grab bars by the toilet and in the tub and shower. Do not use towel bars as grab bars.  Use non-skid mats or decals in the tub or shower.  If you need to sit down in the shower, use a plastic, non-slip stool.  Keep the floor dry. Clean up any water that spills on the floor as soon as it happens.  Remove soap buildup in the tub or shower regularly.  Attach bath mats securely with double-sided non-slip rug tape.  Do not have throw rugs and other things on the floor that can make you trip. What can I do in the bedroom?  Use night lights.  Make sure that you have a light by your bed that is easy to reach.  Do not use any sheets or blankets that are too big for your bed. They should not hang down onto the floor.  Have a firm chair that has side arms. You can use this for support while you get dressed.  Do not have throw rugs and other things on the floor that can make you trip. What can I do in the kitchen?  Clean up any spills right away.  Avoid walking on wet floors.  Keep items that you use a lot in easy-to-reach places.  If you need to reach  something above you, use a strong step stool that has a grab bar.  Keep electrical cords out of the way.  Do not use floor polish or wax that makes floors slippery. If you must use wax, use non-skid floor wax.  Do not have throw rugs and other things on the floor that can make you trip. What can I do with my stairs?  Do not leave any items on the stairs.  Make sure that there are handrails on both sides of the stairs and use them. Fix handrails that are broken or loose.  Make sure that handrails are as long as the stairways.  Check any carpeting to make sure that it is firmly attached to the stairs. Fix any carpet that is loose or worn.  Avoid having throw rugs at the top or bottom of the stairs. If you do have throw rugs, attach them to the floor with carpet tape.  Make sure that you have a light switch at the top of the stairs and the bottom of the stairs. If you do not have them, ask someone to add them for you. What else can I do to help prevent falls?  Wear shoes that: ? Do not have high heels. ? Have rubber bottoms. ? Are comfortable and fit you well. ? Are closed at the toe. Do not wear sandals.  If you use a stepladder: ? Make sure that it is fully opened. Do not climb a closed stepladder. ? Make sure that both sides of the stepladder are locked into place. ? Ask someone to hold it for you, if possible.  Clearly mark and make sure that you can see: ? Any grab bars or handrails. ? First and last steps. ? Where the edge of each step is.  Use tools that help you move around (mobility aids) if they are needed. These include: ? Canes. ? Walkers. ? Scooters. ? Crutches.  Turn on the lights when you go into a dark area. Replace any light bulbs as soon as they burn out.  Set up your furniture so you have a clear path. Avoid moving your furniture around.  If any of your floors are uneven, fix them.  If there are any pets around you, be aware of where they are.  Review  your medicines with your doctor. Some medicines can make you feel dizzy. This can increase your chance of falling. Ask your doctor what other things that you can do to help prevent falls. This information is not intended to replace advice given to you by your health care provider. Make sure you discuss any questions you have with your health care provider. Document Released: 01/27/2009 Document Revised: 09/08/2015 Document Reviewed: 05/07/2014 Elsevier Interactive Patient Education  2018 [place] Maintenance, Female Adopting a healthy lifestyle and getting preventive care can go a long way to promote health and wellness. Talk with your health care provider about what schedule of regular examinations is right for you. This is a good chance for you to check in with your provider about disease prevention and staying healthy. In between checkups, there are plenty of things you can do on your own. Experts have done a lot of research about which lifestyle changes and preventive measures are most likely to keep you healthy. Ask your health care provider for more information. Weight and diet Eat a healthy diet  Be sure to include plenty of vegetables, fruits, low-fat dairy products, and lean protein.  Do not eat a lot of foods high in solid fats, added sugars, or salt.  Get regular exercise. This is one of the most important things you can do for your health. ? Most adults should exercise for at least 150 minutes each week. The exercise should increase your heart rate and make you sweat (moderate-intensity exercise). ? Most adults should also do strengthening exercises at least twice a week. This is in addition to the moderate-intensity exercise.  Maintain a healthy weight  Body mass index (BMI) is a measurement that can be used to identify possible weight problems. It estimates  body fat based on height and weight. Your health care provider can help determine your BMI and help you achieve  or maintain a healthy weight.  For females 63 years of age and older: ? A BMI below 18.5 is considered underweight. ? A BMI of 18.5 to 24.9 is normal. ? A BMI of 25 to 29.9 is considered overweight. ? A BMI of 30 and above is considered obese.  Watch levels of cholesterol and blood lipids  You should start having your blood tested for lipids and cholesterol at 68 years of age, then have this test every 5 years.  You may need to have your cholesterol levels checked more often if: ? Your lipid or cholesterol levels are high. ? You are older than 68 years of age. ? You are at high risk for heart disease.  Cancer screening Lung Cancer  Lung cancer screening is recommended for adults 29-60 years old who are at high risk for lung cancer because of a history of smoking.  A yearly low-dose CT scan of the lungs is recommended for people who: ? Currently smoke. ? Have quit within the past 15 years. ? Have at least a 30-pack-year history of smoking. A pack year is smoking an average of one pack of cigarettes a day for 1 year.  Yearly screening should continue until it has been 15 years since you quit.  Yearly screening should stop if you develop a health problem that would prevent you from having lung cancer treatment.  Breast Cancer  Practice breast self-awareness. This means understanding how your breasts normally appear and feel.  It also means doing regular breast self-exams. Let your health care provider know about any changes, no matter how small.  If you are in your 20s or 30s, you should have a clinical breast exam (CBE) by a health care provider every 1-3 years as part of a regular health exam.  If you are 21 or older, have a CBE every year. Also consider having a breast X-ray (mammogram) every year.  If you have a family history of breast cancer, talk to your health care provider about genetic screening.  If you are at high risk for breast cancer, talk to your health care  provider about having an MRI and a mammogram every year.  Breast cancer gene (BRCA) assessment is recommended for women who have family members with BRCA-related cancers. BRCA-related cancers include: ? Breast. ? Ovarian. ? Tubal. ? Peritoneal cancers.  Results of the assessment will determine the need for genetic counseling and BRCA1 and BRCA2 testing.  Cervical Cancer Your health care provider may recommend that you be screened regularly for cancer of the pelvic organs (ovaries, uterus, and vagina). This screening involves a pelvic examination, including checking for microscopic changes to the surface of your cervix (Pap test). You may be encouraged to have this screening done every 3 years, beginning at age 67.  For women ages 38-65, health care providers may recommend pelvic exams and Pap testing every 3 years, or they may recommend the Pap and pelvic exam, combined with testing for human papilloma virus (HPV), every 5 years. Some types of HPV increase your risk of cervical cancer. Testing for HPV may also be done on women of any age with unclear Pap test results.  Other health care providers may not recommend any screening for nonpregnant women who are considered low risk for pelvic cancer and who do not have symptoms. Ask your health care provider if a screening pelvic  exam is right for you.  If you have had past treatment for cervical cancer or a condition that could lead to cancer, you need Pap tests and screening for cancer for at least 20 years after your treatment. If Pap tests have been discontinued, your risk factors (such as having a new sexual partner) need to be reassessed to determine if screening should resume. Some women have medical problems that increase the chance of getting cervical cancer. In these cases, your health care provider may recommend more frequent screening and Pap tests.  Colorectal Cancer  This type of cancer can be detected and often prevented.  Routine  colorectal cancer screening usually begins at 68 years of age and continues through 68 years of age.  Your health care provider may recommend screening at an earlier age if you have risk factors for colon cancer.  Your health care provider may also recommend using home test kits to check for hidden blood in the stool.  A small camera at the end of a tube can be used to examine your colon directly (sigmoidoscopy or colonoscopy). This is done to check for the earliest forms of colorectal cancer.  Routine screening usually begins at age 58.  Direct examination of the colon should be repeated every 5-10 years through 68 years of age. However, you may need to be screened more often if early forms of precancerous polyps or small growths are found.  Skin Cancer  Check your skin from head to toe regularly.  Tell your health care provider about any new moles or changes in moles, especially if there is a change in a mole's shape or color.  Also tell your health care provider if you have a mole that is larger than the size of a pencil eraser.  Always use sunscreen. Apply sunscreen liberally and repeatedly throughout the day.  Protect yourself by wearing long sleeves, pants, a wide-brimmed hat, and sunglasses whenever you are outside.  Heart disease, diabetes, and high blood pressure  High blood pressure causes heart disease and increases the risk of stroke. High blood pressure is more likely to develop in: ? People who have blood pressure in the high end of the normal range (130-139/85-89 mm Hg). ? People who are overweight or obese. ? People who are African American.  If you are 57-79 years of age, have your blood pressure checked every 3-5 years. If you are 3 years of age or older, have your blood pressure checked every year. You should have your blood pressure measured twice-once when you are at a hospital or clinic, and once when you are not at a hospital or clinic. Record the average of the  two measurements. To check your blood pressure when you are not at a hospital or clinic, you can use: ? An automated blood pressure machine at a pharmacy. ? A home blood pressure monitor.  If you are between 13 years and 45 years old, ask your health care provider if you should take aspirin to prevent strokes.  Have regular diabetes screenings. This involves taking a blood sample to check your fasting blood sugar level. ? If you are at a normal weight and have a low risk for diabetes, have this test once every three years after 68 years of age. ? If you are overweight and have a high risk for diabetes, consider being tested at a younger age or more often. Preventing infection Hepatitis B  If you have a higher risk for hepatitis B, you should  be screened for this virus. You are considered at high risk for hepatitis B if: ? You were born in a country where hepatitis B is common. Ask your health care provider which countries are considered high risk. ? Your parents were born in a high-risk country, and you have not been immunized against hepatitis B (hepatitis B vaccine). ? You have HIV or AIDS. ? You use needles to inject street drugs. ? You live with someone who has hepatitis B. ? You have had sex with someone who has hepatitis B. ? You get hemodialysis treatment. ? You take certain medicines for conditions, including cancer, organ transplantation, and autoimmune conditions.  Hepatitis C  Blood testing is recommended for: ? Everyone born from [birth year] through 1966. ? Anyone with known risk factors for hepatitis C.  Sexually transmitted infections (STIs)  You should be screened for sexually transmitted infections (STIs) including gonorrhea and chlamydia if: ? You are sexually active and are younger than 68 years of age. ? You are older than 68 years of age and your health care provider tells you that you are at risk for this type of infection. ? Your sexual activity has changed since you  were last screened and you are at an increased risk for chlamydia or gonorrhea. Ask your health care provider if you are at risk.  If you do not have HIV, but are at risk, it may be recommended that you take a prescription medicine daily to prevent HIV infection. This is called pre-exposure prophylaxis (PrEP). You are considered at risk if: ? You are sexually active and do not regularly use condoms or know the HIV status of your partner(s). ? You take drugs by injection. ? You are sexually active with a partner who has HIV.  Talk with your health care provider about whether you are at high risk of being infected with HIV. If you choose to begin PrEP, you should first be tested for HIV. You should then be tested every 3 months for as long as you are taking PrEP. Pregnancy  If you are premenopausal and you may become pregnant, ask your health care provider about preconception counseling.  If you may become pregnant, take 400 to 800 micrograms (mcg) of folic acid every day.  If you want to prevent pregnancy, talk to your health care provider about birth control (contraception). Osteoporosis and menopause  Osteoporosis is a disease in which the bones lose minerals and strength with aging. This can result in serious bone fractures. Your risk for osteoporosis can be identified using a bone density scan.  If you are 70 years of age or older, or if you are at risk for osteoporosis and fractures, ask your health care provider if you should be screened.  Ask your health care provider whether you should take a calcium or vitamin D supplement to lower your risk for osteoporosis.  Menopause may have certain physical symptoms and risks.  Hormone replacement therapy may reduce some of these symptoms and risks. Talk to your health care provider about whether hormone replacement therapy is right for you. Follow these instructions at home:  Schedule regular health, dental, and eye exams.  Stay current  with your immunizations.  Do not use any tobacco products including cigarettes, chewing tobacco, or electronic cigarettes.  If you are pregnant, do not drink alcohol.  If you are breastfeeding, limit how much and how often you drink alcohol.  Limit alcohol intake to no more than 1 drink per day for nonpregnant  women. One drink equals 12 ounces of beer, 5 ounces of wine, or 1 ounces of hard liquor.  Do not use street drugs.  Do not share needles.  Ask your health care provider for help if you need support or information about quitting drugs.  Tell your health care provider if you often feel depressed.  Tell your health care provider if you have ever been abused or do not feel safe at home. This information is not intended to replace advice given to you by your health care provider. Make sure you discuss any questions you have with your health care provider. Document Released: 10/16/2010 Document Revised: 09/08/2015 Document Reviewed: 01/04/2015 Elsevier Interactive Patient Education  [employer].

## 2017-08-30 NOTE — Progress Notes (Signed)
Subjective:   [name] is a 67 y.o. female who presents for a subsequent Medicare Annual Wellness Visit.  Reports health as Last OV just today made an apt with Dr. 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 Hepatitis C Screening  [birth year]  . COLONOSCOPY  01/25/2000  . MAMMOGRAM  05/30/2017   Declines hepatitis C  Colonoscopy overdue since 2001  Has not had one in years but  PCV 13 Mammogram 05/2015 - Bone density 05/2015 -0.8  Educated regarding shingrix  Skin surgery center with basal cell  Dermatology      Objective:    Today's Vitals   08/30/17 0900  BP: (!) 146/80  Weight: 175 lb (79.4 kg)  Height: 5\' 7"  (1.702 m)   Body mass index is 27.41 kg/m.  Advanced Directives 08/30/2017 08/16/2016 02/12/2014  Does Patient Have a Medical Advance Directive? Yes Yes Yes  Type of Advance Directive - - Living will  Does patient want to make changes to medical advance directive? - - No - Patient declined    Current Medications (verified) Outpatient Encounter Medications as of 08/30/2017  Medication Sig  . Calcium Carb-Cholecalciferol (CALCIUM 600 + D PO) Take by mouth.  . losartan (COZAAR) 25 MG tablet TAKE 1 TABLET BY MOUTH ONCE DAILY  . Multiple Vitamins-Minerals (MULTIVITAMIN WITH MINERALS) tablet Take 1 tablet by mouth daily.  . vitamin B-12 (CYANOCOBALAMIN) 1000 MCG tablet Take 1,000 mcg by mouth daily.  . meloxicam (MOBIC) 15 MG  tablet Take 1 tablet by mouth daily.   No facility-administered encounter medications on file as of 08/30/2017.     Allergies (verified) Sulfa antibiotics   History: Past Medical History:  Diagnosis Date  . 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 Hypertension   . Skin cancer    skin cancers removed   Past Surgical History:  Procedure Laterality Date  . ABDOMINAL HYSTERECTOMY  2007  . BREAST LUMPECTOMY WITH RADIOACTIVE SEED LOCALIZATION Right 02/18/2014   Procedure: RIGHT BREAST DUCT EXCISION AND RADIOACTIVE SEED LOCALIZED BREAST LUMPECTOMY;  Surgeon: Jackolyn Confer, MD;  Location: [facility];  Service: General;  Laterality: Right;  . BUNIONECTOMY     left  . DILATION AND CURETTAGE OF UTERUS    . DVT  2009   thrombectomy lt ll  . right shoulder  2010   RCR-shoulder  . THROMBECTOMY  2009   left common iliac vein-stent  . TUBAL LIGATION     Family History  Problem Relation Age of Onset  . Diabetes Mother   . Cancer Mother        multiple myloma  . Heart disease Father   . Diabetes Brother   . Diabetes Brother   . Diabetes Brother   . Diabetes Brother   . Diabetes Brother  Social History   Socioeconomic History  . Marital status: Divorced    Spouse name: Not on file  . Number of children: Not on file  . Years of education: Not on file  . Highest education level: Not on file  Occupational History  . Not on file  Social Needs  . Financial resource strain: Not on file  . Food insecurity:    Worry: Not on file    Inability: Not on file  . Transportation needs:    Medical: Not on file    Non-medical: Not on file  Tobacco Use  . Smoking status: Never Smoker  . Smokeless tobacco: Never Used  Substance and Sexual Activity  . Alcohol use: Yes    Comment: rarely  . Drug use: No  . Sexual activity: Yes    Birth control/protection: Surgical  Lifestyle  . Physical activity:    Days per week: Not on file    Minutes per session:  Not on file  . Stress: Not on file  Relationships  . Social connections:    Talks on phone: Not on file    Gets together: Not on file    Attends religious service: Not on file    Active member of club or organization: Not on file    Attends meetings of clubs or organizations: Not on file    Relationship status: Not on file  Other Topics Concern  . Not on file  Social History Narrative  . Not on file    Tobacco Counseling Counseling given: Yes   Clinical Intake:     Activities of Daily Living In your present state of health, do you have any difficulty performing the following activities: 08/30/2017  Hearing? N  Vision? N  Difficulty concentrating or making decisions? N  Walking or climbing stairs? N  Dressing or bathing? N  Doing errands, shopping? N  Preparing Food and eating ? N  Using the Toilet? N  In the past six months, have you accidently leaked urine? N  Do you have problems with loss of bowel control? N  Managing your Medications? N  Managing your Finances? N  Housekeeping or managing your Housekeeping? N  Some recent data might be hidden     Immunizations and Health Maintenance  There is no immunization history on file for this patient. Health Maintenance Due  Topic Date Due  . Hepatitis C Screening  13-Mar-1951  . COLONOSCOPY  01/25/2000  . MAMMOGRAM  05/30/2017    Patient Care Team: Martinique, Betty G, MD as PCP - General (Family Medicine)  Indicate any recent Medical Services you may have received from other than [facility] providers in the past year (date may be approximate).     Assessment:   This is a routine wellness examination for [name].  Hearing/Vision screen  Hearing Screening   125Hz  250Hz  500Hz  1000Hz  2000Hz  3000Hz  4000Hz  6000Hz  8000Hz   Right ear:       100    Left ear:       100    Comments: Hearing is good    Vision Screening Comments: Vision checks regularly  May need cataract surgery soon  Dietary issues and exercise activities  discussed: Current Exercise Habits: Home exercise routine, Type of exercise: walking, Time (Minutes): 60, Frequency (Times/Week): 3, Weekly Exercise (Minutes/Week): 180  Goals    . Patient Stated     Will try to start playing pickle ball this year!        Depression Screen PHQ 2/9 Scores  08/30/2017 08/16/2016 08/16/2016  PHQ - 2 Score 0 0 0    Fall Risk Fall Risk  08/30/2017 08/16/2016  Falls in the past year? No No      Cognitive Function: MMSE - Mini Mental State Exam 08/30/2017 08/16/2016  Not completed: (No Data) (No Data)     Ad8 score reviewed for issues:  Issues making decisions:  Less interest in hobbies / activities:  Repeats questions, stories (family complaining):  Trouble using ordinary gadgets (microwave, computer, phone):  Forgets the month or year:   Mismanaging finances:   Remembering appts:  Daily problems with thinking and/or memory: Ad8 score is=0 Brother had alz  6 brothers and all are older      Screening Tests Health Maintenance  Topic Date Due  . Hepatitis C Screening  [birth year]  . COLONOSCOPY  01/25/2000  . MAMMOGRAM  05/30/2017  . PNA vac Low Risk Adult (1 of 2 - PCV13) 08/31/2018 (Originally 01/25/2015)  . INFLUENZA VACCINE  11/14/2017  . TETANUS/TDAP  04/28/2025  . DEXA SCAN  Completed         Plan:      PCP Notes   Health Maintenance Declines hepatitis C but did discuss at length  Colonoscopy overdue since 2001  Has not had one in years but states she had the cologuard around 2017. Do not see result but states it was negative. To discuss colonoscopy with Dr. Martinique  PCV 13- declines  Does not take IMM  Mammogram 05/2015 -Nemesis [place] to call the Breast center today and have them outreach. The patient is to call me back if she does not receive a call back   Bone density 05/2015 -0.8 Will discuss repeat if Dr. Martinique feels it is necessary.   Skin surgery center with basal cell  Dermatology    Abnormal Screens   none  Referrals  none  Patient concerns;   Nurse Concerns; As noted  Next PCP apt scheduled for May 21 for blood work and discussion of the above    I have personally reviewed and noted the following in the patient's chart:   . Medical and social history . Use of alcohol, tobacco or illicit drugs  . Current medications and supplements . Functional ability and status . Nutritional status . Physical activity . Advanced directives . List of other physicians . Hospitalizations, surgeries, and ER visits in previous 12 months . Vitals . Screenings to include cognitive, depression, and falls . Referrals and appointments  In addition, I have reviewed and discussed with patient certain preventive protocols, quality metrics, and best practice recommendations. A written personalized care plan for preventive services as well as general preventive health recommendations were provided to patient.     Wynetta Fines, RN   08/30/2017

## 2017-09-02 NOTE — Progress Notes (Signed)
[name] is a 67 y.o.female, who is here today to follow on HTN.  Last follow-up in 08/2016. Since her last OV she has followed with her dermatologist and had [place] removed from nose.    Currently she is on losartan 25 mg daily. She is taking medications as instructed, no side effects reported.  She has not noted unusual headache, visual changes, exertional chest pain, dyspnea,  focal weakness, or edema.  Home BPs: Not checking regularly.  Lab Results  Component Value Date   CREATININE 0.81 10/27/2015   BUN 19 10/27/2015   NA 144 10/27/2015   K 4.4 10/27/2015   CL 108 10/27/2015   CO2 26 10/27/2015    Today she is complaining of at least a month of left ear stabbing pain. She has not noted hearing loss. No recent URI. No history of trauma.  Pain is radiated to TMJ area and sometimes down to lateral aspect of neck.  She has not identified exacerbating or alleviating factors.  Cervical pain for about 2 weeks, no history of trauma. Pain is radiated to left shoulder. + Neck stiffness. No significant limitation of ROM. No associated numbness or tingling but states that sometimes she feels like LUE is weak. She has not noted rash. Pain is exacerbated by neck movement and alleviated by rest. She took ibuprofen once, pain improved but did not resolve.  Problem otherwise stable.   Review of Systems  Constitutional: Negative for activity change, appetite change, fatigue and fever.  HENT: Positive for ear pain. Negative for ear discharge, facial swelling, hearing loss, mouth sores, nosebleeds, sore throat and trouble swallowing.   Eyes: Negative for redness and visual disturbance.  Respiratory: Negative for cough, shortness of breath and wheezing.   Cardiovascular: Negative for chest pain, palpitations and leg swelling.  Gastrointestinal: Negative for abdominal pain, nausea and vomiting.       Negative for changes in bowel habits.  Endocrine: Negative for cold  intolerance and heat intolerance.  Genitourinary: Negative for decreased urine volume, dysuria and hematuria.  Musculoskeletal: Positive for neck pain. Negative for gait problem.  Skin: Negative for color change and rash.  Neurological: Negative for syncope, weakness, numbness and headaches.  Psychiatric/Behavioral: Negative for confusion. The patient is nervous/anxious.      Current Outpatient Medications on File Prior to Visit  Medication Sig Dispense Refill  . Calcium Carb-Cholecalciferol (CALCIUM 600 + D PO) Take by mouth.    . Multiple Vitamins-Minerals (MULTIVITAMIN WITH MINERALS) tablet Take 1 tablet by mouth daily.    . vitamin B-12 (CYANOCOBALAMIN) 1000 MCG tablet Take 1,000 mcg by mouth daily.     No current facility-administered medications on file prior to visit.      Past Medical History:  Diagnosis Date  . 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 Hypertension   . Skin cancer    skin cancers removed    Allergies  Allergen Reactions  . Sulfa Antibiotics Rash    Social History   Socioeconomic History  . Marital status: Divorced    Spouse name: Not on file  . Number of children: Not on file  . Years of education: Not on file  . Highest education level: Not on file  Occupational History  . Not on file  Social Needs  . Financial resource strain: Not on file  . Food insecurity:    Worry: Not on file    Inability: Not on file  . Transportation needs:  Medical: Not on file    Non-medical: Not on file  Tobacco Use  . Smoking status: Never Smoker  . Smokeless tobacco: Never Used  Substance and Sexual Activity  . Alcohol use: Yes    Comment: rarely  . Drug use: No  . Sexual activity: Yes    Birth control/protection: Surgical  Lifestyle  . Physical activity:    Days per week: Not on file    Minutes per session: Not on file  . Stress: Not on file  Relationships  . Social connections:    Talks on phone: Not on file    Gets together:  Not on file    Attends religious service: Not on file    Active member of club or organization: Not on file    Attends meetings of clubs or organizations: Not on file    Relationship status: Not on file  Other Topics Concern  . Not on file  Social History Narrative  . Not on file    Vitals:   09/03/17 0852  BP: 125/86  Pulse: 75  Resp: 12  Temp: 98.5 F (36.9 C)  SpO2: 97%   Body mass index is 27.6 kg/m.    Physical Exam  Nursing note and vitals reviewed. Constitutional: She is oriented to person, place, and time. She appears well-developed and well-nourished. No distress.  HENT:  Head: Normocephalic and atraumatic.  Mouth/Throat: Oropharynx is clear and moist and mucous membranes are normal.  Cerumen excess bilateral, not able to see TM.  Eyes: Pupils are equal, round, and reactive to light. Conjunctivae and EOM are normal.  Cardiovascular: Normal rate and regular rhythm.  No murmur heard. Pulses:      Dorsalis pedis pulses are 2+ on the right side, and 2+ on the left side.  Respiratory: Effort normal and breath sounds normal. No respiratory distress.  GI: Soft. She exhibits no mass. There is no hepatomegaly. There is no tenderness.  Musculoskeletal: She exhibits no edema.       Cervical back: She exhibits decreased range of motion. She exhibits no tenderness and no bony tenderness.  No tenderness upon palpation of paraspinal cervical muscles, pain is elicited by ROM, which is mildly limited.  Lymphadenopathy:    She has no cervical adenopathy.  Neurological: She is alert and oriented to person, place, and time. She has normal strength. Gait normal.  Skin: Skin is warm. No erythema.  Psychiatric: She has a normal mood and affect.  Well groomed, good eye contact.    ASSESSMENT AND PLAN:   [name] was seen today for follow-up.  Orders Placed This Encounter  Procedures  . DG Cervical Spine Complete  . Basic metabolic panel  . Ambulatory referral to  Gastroenterology   Lab Results  Component Value Date   CREATININE 0.75 09/03/2017   BUN 13 09/03/2017   NA 142 09/03/2017   K 4.1 09/03/2017   CL 107 09/03/2017   CO2 28 09/03/2017     Essential hypertension, benign Adequately controlled. No changes in current management. She will continue low-salt diet Eye exam is current. Given the fact she is taking a low dose of losartan, if lab work is normal and it is appropriate to follow annually. She needs to monitor BP regularly and follow sooner if needed.   Bilateral impacted cerumen  Could be causing earache, it doe snot seem to be causing hearing loss. After discussion of risks or ear lavage,she would like to have it done. Tolerated well,no complications. TM visible,no  erythema.  Earache, left I am not sure about etiology for left earache. Possible etiology discussed, it could be referred from TMJ. She felt better after ear lavage. She will monitor for changes.   Cervicalgia  She is not interested in a muscle relaxants. She will let me know in 2 to 3 weeks if she still having pain, in which case we will consider PT evaluation.  For now she will try ROM exercises, topical icy hot with lidocaine or Aspercreme with lidocaine, massage, and OTC Aleve to 220 mg twice daily for 7 to 10 days.  We discussed some side effects of NSAIDs  -     DG Cervical Spine Complete; Future  Colon cancer screening -     Ambulatory referral to Gastroenterology   . -Ms. Genoveva Ill was advised to return sooner than planned today if new concerns arise.     Champ Keetch G. Martinique, MD  [facility]. [place] office.

## 2017-09-03 ENCOUNTER — Encounter: Payer: Self-pay | Admitting: Family Medicine

## 2017-09-03 ENCOUNTER — Ambulatory Visit (INDEPENDENT_AMBULATORY_CARE_PROVIDER_SITE_OTHER): Payer: Medicare Other | Admitting: Family Medicine

## 2017-09-03 ENCOUNTER — Ambulatory Visit (INDEPENDENT_AMBULATORY_CARE_PROVIDER_SITE_OTHER)
Admission: RE | Admit: 2017-09-03 | Discharge: 2017-09-03 | Disposition: A | Discharge status: Home / Self Care | Payer: Medicare Other | Source: Ambulatory Visit | Attending: Family Medicine | Admitting: Family Medicine

## 2017-09-03 VITALS — BP 125/86 | HR 75 | Temp 98.5°F | Resp 12 | Ht 67.0 in | Wt 176.2 lb

## 2017-09-03 DIAGNOSIS — I10 Essential (primary) hypertension: Secondary | ICD-10-CM | POA: Diagnosis not present

## 2017-09-03 DIAGNOSIS — M542 Cervicalgia: Secondary | ICD-10-CM

## 2017-09-03 DIAGNOSIS — H6123 Impacted cerumen, bilateral: Secondary | ICD-10-CM

## 2017-09-03 DIAGNOSIS — M47812 Spondylosis without myelopathy or radiculopathy, cervical region: Secondary | ICD-10-CM | POA: Diagnosis not present

## 2017-09-03 DIAGNOSIS — H9202 Otalgia, left ear: Secondary | ICD-10-CM | POA: Diagnosis not present

## 2017-09-03 DIAGNOSIS — Z1211 Encounter for screening for malignant neoplasm of colon: Secondary | ICD-10-CM

## 2017-09-03 LAB — BASIC METABOLIC PANEL
BUN: 13 mg/dL (ref 6–23)
CALCIUM: 9 mg/dL (ref 8.4–10.5)
CO2: 28 meq/L (ref 19–32)
Chloride: 107 mEq/L (ref 96–112)
Creatinine, Ser: 0.75 mg/dL (ref 0.40–1.20)
GFR: 81.77 mL/min (ref >60.00)
Glucose, Bld: 90 mg/dL (ref 70–99)
Potassium: 4.1 mEq/L (ref 3.5–5.1)
Sodium: 142 mEq/L (ref 135–145)

## 2017-09-03 MED ORDER — LOSARTAN POTASSIUM 25 MG PO TABS
25.0000 mg | ORAL_TABLET | Freq: Every day | ORAL | 3 refills | Status: DC
Start: 2017-09-03 — End: 2018-06-18

## 2017-09-03 NOTE — Assessment & Plan Note (Signed)
Adequately controlled. No changes in current management. She will continue low-salt diet Eye exam is current. Given the fact she is taking a low dose of losartan, if lab work is normal and it is appropriate to follow annually. She needs to monitor BP regularly and follow sooner if needed.

## 2017-09-03 NOTE — Patient Instructions (Addendum)
A few things to remember from today's visit:   Essential hypertension, benign - Plan: Basic metabolic panel  Bilateral impacted cerumen  Earache, left  Cervicalgia - Plan: DG Cervical Spine Complete  Colon cancer screening - Plan: Ambulatory referral to Gastroenterology Since you are taking a low dose of blood pressure medication, if everything is normal with labs today I think is appropriate to follow annually.  You need to continue monitoring blood pressure periodically.  For neck pain over-the-counter IcyHot with lidocaine or Aspercreme with lidocaine my help. Muscle relaxants at bedtime, they cause drowsiness. Range of motion exercises and/or physical therapy will be beneficial. Today X ray was ordered.  This can be done at [facility] at [facility] between 8 am and 5 pm: [place]. [phone].   Please be sure medication list is accurate. If a new problem present, please set up appointment sooner than planned today.

## 2017-09-05 ENCOUNTER — Encounter: Payer: Self-pay | Admitting: *Deleted

## 2017-11-04 ENCOUNTER — Encounter: Payer: Self-pay | Admitting: Family Medicine

## 2017-12-10 DIAGNOSIS — M659 Synovitis and tenosynovitis, unspecified: Secondary | ICD-10-CM | POA: Diagnosis not present

## 2017-12-10 DIAGNOSIS — M71572 Other bursitis, not elsewhere classified, left ankle and foot: Secondary | ICD-10-CM | POA: Diagnosis not present

## 2017-12-10 DIAGNOSIS — M21622 Bunionette of left foot: Secondary | ICD-10-CM | POA: Diagnosis not present

## 2017-12-23 ENCOUNTER — Ambulatory Visit: Payer: Self-pay | Admitting: Podiatry

## 2017-12-25 DIAGNOSIS — M7742 Metatarsalgia, left foot: Secondary | ICD-10-CM | POA: Diagnosis not present

## 2017-12-25 DIAGNOSIS — R29898 Other symptoms and signs involving the musculoskeletal system: Secondary | ICD-10-CM | POA: Diagnosis not present

## 2018-02-12 ENCOUNTER — Ambulatory Visit: Payer: Medicare Other | Admitting: Family Medicine

## 2018-02-12 DIAGNOSIS — Z0289 Encounter for other administrative examinations: Secondary | ICD-10-CM

## 2018-06-12 ENCOUNTER — Ambulatory Visit: Payer: Self-pay

## 2018-06-12 NOTE — Telephone Encounter (Signed)
Phone call returned to pt.  Reported she has had elevated BP readings, since she has been checking them over past couple of weeks.  Reported she felt light-headed on 2/14 and 2/15, and was advised by a friend to check her BP.  Stated that prior to this, she had not been monitoring her BP.  Reported readings 133/76-157/92 between 2/17-2/26.  Stated she increased her water intake, and the light-headed feeling subsided. Today, at 7:20 AM: BP 124/84 and pulse 76.  Denied headache, dizziness, change in vision, or weakness in extremities at this time.  Stated she has chronic sinus congestion and intermittent pressure in her head; described "sometimes it feels like a balloon is inflated in my head."  Reported this is an ongoing issue.  Also stated "it seems like I run into things more often."  C/o intermittent ringing in her ears.  Appt. Offered tomorrow.  Stated she would like to make appt. Next week.  Appt. Given for 06/18/18 with PCP.  Care advice given per protocol.  Advised to call back with any worsening of symptoms.  Verb. Understanding. Agrees with plan.            Reason for Disposition . [8] Systolic BP  >= 325 OR Diastolic >= 80 AND [4] taking BP medications  Answer Assessment - Initial Assessment Questions 1. BLOOD PRESSURE: "What is the blood pressure?" "Did you take at least two measurements 5 minutes apart?"     BP 124/84 , pulse 76 @7 :20 AM today ;   BP 143/92, pulse 69 @7 :45 AM 2/26 2. ONSET: "When did you take your blood pressure?"     See above 3. HOW: "How did you obtain the blood pressure?" (e.g., visiting nurse, automatic home BP monitor)     Automatic home monitor 4. HISTORY: "Do you have a history of high blood pressure?"     yes 5. MEDICATIONS: "Are you taking any medications for blood pressure?" "Have you missed any doses recently?"     Losartan; taking regularly  6. OTHER SYMPTOMS: "Do you have any symptoms?" (e.g., headache, chest pain, blurred vision, difficulty breathing,  weakness)     Denied headache, dizziness, blurred vision, denied weakness of extremities; feels she is running into things more often.; c/o intermittent ringing in the ears; reports she always has sinus drainage and pressure in head.  7. PREGNANCY: "Is there any chance you are pregnant?" "When was your last menstrual period?"     N/a  Protocols used: HIGH BLOOD PRESSURE-A-AH  Message from Bea Graff, NT sent at 06/12/2018 8:40 AM EST   Summary: discuss BP readings   Pt states she is unsure if her BP meds should be changed. She states her BP have been reading around 157/92 and she would like to discuss with a nurse.

## 2018-06-18 ENCOUNTER — Encounter: Payer: Self-pay | Admitting: Family Medicine

## 2018-06-18 ENCOUNTER — Ambulatory Visit (INDEPENDENT_AMBULATORY_CARE_PROVIDER_SITE_OTHER): Payer: Medicare Other | Admitting: Family Medicine

## 2018-06-18 VITALS — BP 140/90 | HR 62 | Temp 98.5°F | Resp 12 | Ht 67.0 in | Wt 180.0 lb

## 2018-06-18 DIAGNOSIS — I10 Essential (primary) hypertension: Secondary | ICD-10-CM

## 2018-06-18 DIAGNOSIS — M542 Cervicalgia: Secondary | ICD-10-CM

## 2018-06-18 DIAGNOSIS — H9313 Tinnitus, bilateral: Secondary | ICD-10-CM

## 2018-06-18 LAB — BASIC METABOLIC PANEL
BUN: 14 mg/dL (ref 6–23)
CO2: 30 mEq/L (ref 19–32)
Calcium: 9.6 mg/dL (ref 8.4–10.5)
Chloride: 105 mEq/L (ref 96–112)
Creatinine, Ser: 0.76 mg/dL (ref 0.40–1.20)
GFR: 75.59 mL/min (ref >60.00)
Glucose, Bld: 92 mg/dL (ref 70–99)
Potassium: 4.4 mEq/L (ref 3.5–5.1)
SODIUM: 142 meq/L (ref 135–145)

## 2018-06-18 MED ORDER — LOSARTAN POTASSIUM 50 MG PO TABS: 50.0000 mg | ORAL_TABLET | Freq: Every day | ORAL | 1 refills | Status: DC

## 2018-06-18 NOTE — Assessment & Plan Note (Signed)
Some elevated BPs at home, here today adequately controlled for her age. Because she is having some 150s/90s at home, Cozaar increased from 25 mg to 50 mg. We discussed some side effects of medication. Recommend continuing monitoring BP. Low-salt diet. Instructed about warning signs. Follow-up in 2 months, before if needed.

## 2018-06-18 NOTE — Progress Notes (Signed)
[name] is a 68 y.o.female, who is here today concerned about elevated BP.  Last follow up visit: 09/03/17. HTN, currently on Losartan 25 mg daily.. She is taking medications as instructed, no side effects reported.  She has not noted headache, visual changes, exertional chest pain, dyspnea,  focal weakness, or edema. Home BP readings: 120's-150's/80-90's   Lab Results  Component Value Date   CREATININE 0.75 09/03/2017   BUN 13 09/03/2017   NA 142 09/03/2017   K 4.1 09/03/2017   CL 107 09/03/2017   CO2 28 09/03/2017   + Tinnitus,she has had problem for years,intermittent,and more frequent for the past few days.  She has not identified exacerbating or alleviating factors. No changes in hearing changes. 2-3 days of lightheadedness.  Lateral neck pain,also for  A while. No Hx of trauma. No limitation of ROM. Exacerbated by movement and alleviated by massage.    Review of Systems  Constitutional: Negative for activity change, appetite change, fatigue and fever.  HENT: Positive for tinnitus. Negative for hearing loss, mouth sores, nosebleeds and sore throat.   Eyes: Negative for redness and visual disturbance.  Respiratory: Negative for cough and shortness of breath.   Cardiovascular: Negative for chest pain, palpitations and leg swelling.  Gastrointestinal: Negative for abdominal pain, nausea and vomiting.       Negative for changes in bowel habits.  Genitourinary: Negative for decreased urine volume and hematuria.  Musculoskeletal: Positive for neck stiffness. Negative for gait problem.  Neurological: Positive for light-headedness. Negative for syncope, weakness and headaches.  Psychiatric/Behavioral: Negative for confusion. The patient is nervous/anxious.      Current Outpatient Medications on File Prior to Visit  Medication Sig Dispense Refill  . Calcium Carbonate-Vitamin D (CALCIUM HIGH POTENCY/VITAMIN D) 600-200 MG-UNIT TABS Take by mouth.    .  Multiple Vitamins-Minerals (MULTIVITAMIN WITH MINERALS) tablet Take by mouth.    . TURMERIC PO Take by mouth daily.    . vitamin B-12 (CYANOCOBALAMIN) 1000 MCG tablet Take by mouth.     No current facility-administered medications on file prior to visit.      Past Medical History:  Diagnosis Date  . 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 Hypertension   . Skin cancer    skin cancers removed    Allergies  Allergen Reactions  . Sulfa Antibiotics Rash    Social History   Socioeconomic History  . Marital status: Divorced    Spouse name: Not on file  . Number of children: Not on file  . Years of education: Not on file  . Highest education level: Not on file  Occupational History  . Not on file  Social Needs  . Financial resource strain: Not on file  . Food insecurity:    Worry: Not on file    Inability: Not on file  . Transportation needs:    Medical: Not on file    Non-medical: Not on file  Tobacco Use  . Smoking status: Never Smoker  . Smokeless tobacco: Never Used  Substance and Sexual Activity  . Alcohol use: Yes    Comment: rarely  . Drug use: No  . Sexual activity: Yes    Birth control/protection: Surgical  Lifestyle  . Physical activity:    Days per week: Not on file    Minutes per session: Not on file  . Stress: Not on file  Relationships  . Social connections:    Talks on phone: Not  on file    Gets together: Not on file    Attends religious service: Not on file    Active member of club or organization: Not on file    Attends meetings of clubs or organizations: Not on file    Relationship status: Not on file  Other Topics Concern  . Not on file  Social History Narrative  . Not on file    Vitals:   06/18/18 0834  BP: 140/90  Pulse: 62  Resp: 12  Temp: 98.5 F (36.9 C)  SpO2: 97%   Body mass index is 28.19 kg/m.    Physical Exam  Nursing note and vitals reviewed. Constitutional: She is oriented to person, place, and  time. She appears well-developed. No distress.  HENT:  Head: Normocephalic and atraumatic.  Right Ear: External ear normal.  Left Ear: Tympanic membrane, external ear and ear canal normal.  Mouth/Throat: Oropharynx is clear and moist and mucous membranes are normal.  Right ear canal excess cerumen, cannot see TM.  Eyes: Pupils are equal, round, and reactive to light. Conjunctivae are normal.  Neck: Carotid bruit is not present.  Cardiovascular: Normal rate and regular rhythm.  No murmur heard. Pulses:      Dorsalis pedis pulses are 2+ on the right side and 2+ on the left side.  Respiratory: Effort normal and breath sounds normal. No respiratory distress.  GI: Soft. She exhibits no mass. There is no hepatomegaly. There is no abdominal tenderness.  Musculoskeletal:        General: No edema.     Cervical back: She exhibits no tenderness and no bony tenderness.  Lymphadenopathy:    She has no cervical adenopathy.  Neurological: She is alert and oriented to person, place, and time. She has normal strength. No cranial nerve deficit. Gait normal.  Skin: Skin is warm. No rash noted. No erythema.  Psychiatric: Her mood appears anxious.  Well groomed, good eye contact.    ASSESSMENT AND PLAN:   [name] was seen today for hypertension.  Diagnoses and all orders for this visit:  Essential hypertension, benign -     Basic metabolic panel -     losartan (COZAAR) 50 MG tablet; Take 1 tablet (50 mg total) by mouth daily.  Tinnitus of both ears Reassured in regard to her tinnitus. She prefers to hold on hearing test, she has one about 2 years ago and it was normal. Instructed about warning signs.  Neck pain Problem seems to be a chronic problem. On examination no masses, no bruits, or the skin changes. I do not think imaging is needed at this time.   Essential hypertension, benign Some elevated BPs at home, here today adequately controlled for her age. Because she is having some  150s/90s at home, Cozaar increased from 25 mg to 50 mg. We discussed some side effects of medication. Recommend continuing monitoring BP. Low-salt diet. Instructed about warning signs. Follow-up in 2 months, before if needed.    Return in about 2 months (around 08/18/2018) for HTN.    [name] Sample G. Martinique, MD  [facility]. [place] office.

## 2018-06-18 NOTE — Patient Instructions (Addendum)
A few things to remember from today's visit:   Essential hypertension, benign - Plan: Basic metabolic panel, losartan (COZAAR) 50 MG tablet  Tinnitus of both ears  Neck pain  Cozaar increased from 25 mg to 50 mg. Continue monitoring blood pressure at home, I would like blood pressure less than 140/90. Continue low-salt diet.   Please be sure medication list is accurate. If a new problem present, please set up appointment sooner than planned today.

## 2018-06-27 ENCOUNTER — Telehealth: Payer: Self-pay | Admitting: Family Medicine

## 2018-06-27 NOTE — Telephone Encounter (Signed)
Copied from [place] [id number]. Topic: General - Other >> Jun 26, 2018 12:20 PM Ivar Drape wrote: Reason for CRM:  Patient was given her lab results, but she would also like them to be mailed to her.

## 2018-07-01 NOTE — Telephone Encounter (Signed)
Lab results mailed to address on file. Nothing further needed at this time.

## 2018-08-18 ENCOUNTER — Ambulatory Visit: Payer: Medicare Other | Admitting: Family Medicine

## 2018-09-03 ENCOUNTER — Ambulatory Visit: Payer: Medicare Other

## 2018-09-05 ENCOUNTER — Ambulatory Visit (INDEPENDENT_AMBULATORY_CARE_PROVIDER_SITE_OTHER): Payer: Medicare Other | Admitting: Family Medicine

## 2018-09-05 ENCOUNTER — Encounter: Payer: Self-pay | Admitting: Family Medicine

## 2018-09-05 VITALS — BP 119/75 | HR 76

## 2018-09-05 DIAGNOSIS — Z780 Asymptomatic menopausal state: Secondary | ICD-10-CM | POA: Diagnosis not present

## 2018-09-05 DIAGNOSIS — Z0000 Encounter for general adult medical examination without abnormal findings: Secondary | ICD-10-CM

## 2018-09-05 DIAGNOSIS — I83892 Varicose veins of left lower extremities with other complications: Secondary | ICD-10-CM | POA: Diagnosis not present

## 2018-09-05 DIAGNOSIS — I10 Essential (primary) hypertension: Secondary | ICD-10-CM | POA: Diagnosis not present

## 2018-09-05 NOTE — Progress Notes (Signed)
Virtual Visit via Video Note   I connected with [name] on 09/06/18 at  8:00 AM EDT by a video enabled telemedicine application and verified that I am speaking with the correct person using two identifiers.  Location patient: home Location provider:work office Persons participating in the virtual visit: patient, provider  I discussed the limitations of evaluation and management by telemedicine and the availability of in person appointments. The patient expressed understanding and agreed to proceed.  Chief Complaint  Patient presents with  . Follow-up  . Medicare Wellness   HPI: [name] is a 68 years old female, last seen on 06/18/2018.  Last OV we increased Cozaar from 25 mg to 50 mg. She has tolerated medication well. BP improved "significanly", she took BP the fist 3-4 weeks after last OV but not recently. Negative for headache,visual changes,chest pain,dyspnea,abdominal pain,gross hematuria,or decreased urine output.   Lab Results  Component Value Date   CREATININE 0.76 06/18/2018   BUN 14 06/18/2018   NA 142 06/18/2018   K 4.4 06/18/2018   CL 105 06/18/2018   CO2 30 06/18/2018   Today she is c/o left ankle edema,intermittent noted a couple of weeks ago. No pain,tenderness,or local erythema. She has not identified exacerbating factors but seems to be better with walking. Past Hx of DVT.  No Hx of trauma,recent surgery,or long sitting due to travel. Negative for orthopnea or PND.   Medicare preventive visit: Last medicare annual visit was on 08/30/17.  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 She lives alone. Independent ADL's and IADL's. No falls in the past year and denies depression symptoms.  Functional Status Survey: Is the patient deaf or have difficulty hearing?: No Does the patient have difficulty seeing, even when wearing glasses/contacts?: No Does the patient have difficulty concentrating, remembering, or  making decisions?: No Does the patient have difficulty walking or climbing stairs?: No Does the patient have difficulty dressing or bathing?: No Does the patient have difficulty doing errands alone such as visiting a doctor's office or shopping?: No  Fall Risk  09/05/2018 08/30/2017 08/16/2016  Falls in the past year? 0 No No  Number falls in past yr: 0 - -  Injury with Fall? 0 - -  Follow up Education provided - -   Sleeps about 6-7 hours. She follows a healthful diet. She walks a few times per week. She is active at home,she does yard work.  Providers she sees regularly:  Eye care provider: She sees provider [facility]. Dermatologist annually, Dr Rob Bunting, [facility] [place].  Depression screen [facility] 2/9 09/05/2018  Decreased Interest 0  Down, Depressed, Hopeless 0  PHQ - 2 Score 0    Mini-Cog - 09/05/18 0841    Normal clock drawing test?  yes    How many words correct?  3      Vision Screening Comments: Virtual visit.  Mammogram 05/2015 Bi-Rads 2. DEXA 05/2015 normal. Hep C screening,she is not sure. Colonoscopy is due,appt cancelled and re-scheduled for 11/2018.  She takes Ca++ and vit D supplementation.  ROS: See pertinent positives and negatives per HPI.  Past Medical History:  Diagnosis Date  . 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 Hypertension   . Skin cancer    skin cancers removed    Past Surgical History:  Procedure Laterality Date  . ABDOMINAL HYSTERECTOMY  2007  . BREAST LUMPECTOMY WITH RADIOACTIVE SEED LOCALIZATION Right 02/18/2014   Procedure:  RIGHT BREAST DUCT EXCISION AND RADIOACTIVE SEED LOCALIZED BREAST LUMPECTOMY;  Surgeon: Jackolyn Confer, MD;  Location: [facility];  Service: General;  Laterality: Right;  . BUNIONECTOMY     left  . DILATION AND CURETTAGE OF UTERUS    . DVT  2009   thrombectomy lt ll  . right shoulder  2010   RCR-shoulder  . THROMBECTOMY  2009   left common iliac vein-stent  . TUBAL LIGATION       Family History  Problem Relation Age of Onset  . Diabetes Mother   . Cancer Mother        multiple myloma  . Heart disease Father   . Diabetes Brother   . Diabetes Brother   . Diabetes Brother   . Diabetes Brother   . Diabetes Brother     Social History   Socioeconomic History  . Marital status: Divorced    Spouse name: Not on file  . Number of children: Not on file  . Years of education: Not on file  . Highest education level: Not on file  Occupational History  . Not on file  Social Needs  . Financial resource strain: Not on file  . Food insecurity:    Worry: Not on file    Inability: Not on file  . Transportation needs:    Medical: Not on file    Non-medical: Not on file  Tobacco Use  . Smoking status: Never Smoker  . Smokeless tobacco: Never Used  Substance and Sexual Activity  . Alcohol use: Yes    Comment: rarely  . Drug use: No  . Sexual activity: Yes    Birth control/protection: Surgical  Lifestyle  . Physical activity:    Days per week: Not on file    Minutes per session: Not on file  . Stress: Not on file  Relationships  . Social connections:    Talks on phone: Not on file    Gets together: Not on file    Attends religious service: Not on file    Active member of club or organization: Not on file    Attends meetings of clubs or organizations: Not on file    Relationship status: Not on file  . Intimate partner violence:    Fear of current or ex partner: Not on file    Emotionally abused: Not on file    Physically abused: Not on file    Forced sexual activity: Not on file  Other Topics Concern  . Not on file  Social History Narrative  . Not on file     Current Outpatient Medications:  .  Calcium Carbonate-Vitamin D (CALCIUM HIGH POTENCY/VITAMIN D) 600-200 MG-UNIT TABS, Take by mouth., Disp: , Rfl:  .  losartan (COZAAR) 50 MG tablet, Take 1 tablet (50 mg total) by mouth daily., Disp: 90 tablet, Rfl: 1 .  Multiple Vitamins-Minerals  (MULTIVITAMIN WITH MINERALS) tablet, Take by mouth., Disp: , Rfl:  .  TURMERIC PO, Take by mouth daily., Disp: , Rfl:  .  vitamin B-12 (CYANOCOBALAMIN) 1000 MCG tablet, Take by mouth., Disp: , Rfl:   EXAM:  VITALS per patient if applicable:BP 629/52   Pulse 76   GENERAL: alert, oriented, appears well and in no acute distress  HEENT: atraumatic, conjunctiva clear, no obvious facial abnormalities on inspection.  LUNGS: on inspection no signs of respiratory distress, breathing rate appears normal, no obvious gross SOB, gasping or wheezing  CV: no obvious cyanosis.Moderate LE varicose veins, bilateral.  Leslie:  moves all visible extremities without noticeable abnormality. Mild left peri ankle edema.No erythema,no limitation of ankle ROM.I do not appreciate erythema.  PSYCH/NEURO: pleasant and cooperative, no obvious depression or anxiety, speech and thought processing grossly intact  ASSESSMENT AND PLAN:  Discussed the following assessment and plan:  Medicare annual wellness visit, subsequent We discussed the importance of staying active, physically and mentally, as well as the benefits of a healthy/balance diet. Low impact exercise that involve stretching and strengthing are ideal. Vaccines not up to date, she has refused prevnar 13 and pneumovax.  We discussed preventive screening for the next 5-10 years, summery of recommendations discussed.  Colonoscopy 11/2018. She will call to schedule her mammogram. Annual glaucoma screening. Diabetes and lipid disorder screening in 202, explained that these may not be covered by Medicare. Fall prevention.  Will planned on hep C screening and BMP next OV due to current concerns about COVID 19 pandemia. Advance directives and end of life discussed, she has POA and living will,asked for a copy.  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 Most likely related to vein disease. Continue monitoring for now. Compression stockings recommended. Instructed about warning signs.  Essential hypertension, benign BP now adequately controlled. No changes in Cozaar 50 mg daily. Continue low-salt diet. She will be arranging next eye exam. Continue monitoring BP regularly, I will see her back in 6 months.   I discussed the assessment and treatment plan with the patient. She was provided an opportunity to ask questions and all were answered. The patient agreed with the plan and demonstrated an understanding of the instructions.   The patient was advised to call back or seek an in-person evaluation if the symptoms worsen or if the condition fails to improve as anticipated.  Return in about 6 months (around 03/08/2019).    Betty Martinique, MD

## 2018-09-05 NOTE — Assessment & Plan Note (Signed)
BP now adequately controlled. No changes in Cozaar 50 mg daily. Continue low-salt diet. She will be arranging next eye exam. Continue monitoring BP regularly, I will see her back in 6 months.

## 2018-10-29 DIAGNOSIS — K648 Other hemorrhoids: Secondary | ICD-10-CM | POA: Diagnosis not present

## 2018-10-29 DIAGNOSIS — Z1211 Encounter for screening for malignant neoplasm of colon: Secondary | ICD-10-CM | POA: Diagnosis not present

## 2018-10-29 DIAGNOSIS — K640 First degree hemorrhoids: Secondary | ICD-10-CM | POA: Diagnosis not present

## 2018-10-29 DIAGNOSIS — K5730 Diverticulosis of large intestine without perforation or abscess without bleeding: Secondary | ICD-10-CM | POA: Diagnosis not present

## 2018-10-29 DIAGNOSIS — D125 Benign neoplasm of sigmoid colon: Secondary | ICD-10-CM | POA: Diagnosis not present

## 2018-10-29 DIAGNOSIS — K635 Polyp of colon: Secondary | ICD-10-CM | POA: Diagnosis not present

## 2018-11-03 DIAGNOSIS — D225 Melanocytic nevi of trunk: Secondary | ICD-10-CM | POA: Diagnosis not present

## 2018-11-03 DIAGNOSIS — L814 Other melanin hyperpigmentation: Secondary | ICD-10-CM | POA: Diagnosis not present

## 2018-11-03 DIAGNOSIS — L570 Actinic keratosis: Secondary | ICD-10-CM | POA: Diagnosis not present

## 2018-11-03 DIAGNOSIS — Z86018 Personal history of other benign neoplasm: Secondary | ICD-10-CM | POA: Diagnosis not present

## 2018-11-03 DIAGNOSIS — Z85828 Personal history of other malignant neoplasm of skin: Secondary | ICD-10-CM | POA: Diagnosis not present

## 2018-11-03 DIAGNOSIS — L821 Other seborrheic keratosis: Secondary | ICD-10-CM | POA: Diagnosis not present

## 2018-12-05 ENCOUNTER — Other Ambulatory Visit: Payer: Medicare Other

## 2018-12-10 ENCOUNTER — Other Ambulatory Visit: Payer: Self-pay

## 2018-12-10 ENCOUNTER — Other Ambulatory Visit: Payer: Self-pay | Admitting: Family Medicine

## 2018-12-10 ENCOUNTER — Ambulatory Visit
Admission: RE | Admit: 2018-12-10 | Discharge: 2018-12-10 | Disposition: A | Discharge status: Home / Self Care | Payer: Medicare Other | Source: Ambulatory Visit | Attending: Family Medicine | Admitting: Family Medicine

## 2018-12-10 DIAGNOSIS — Z780 Asymptomatic menopausal state: Secondary | ICD-10-CM

## 2018-12-10 DIAGNOSIS — Z1231 Encounter for screening mammogram for malignant neoplasm of breast: Secondary | ICD-10-CM

## 2018-12-10 DIAGNOSIS — Z13820 Encounter for screening for osteoporosis: Secondary | ICD-10-CM | POA: Diagnosis not present

## 2018-12-18 ENCOUNTER — Ambulatory Visit
Admission: RE | Admit: 2018-12-18 | Discharge: 2018-12-18 | Disposition: A | Discharge status: Home / Self Care | Payer: Medicare Other | Source: Ambulatory Visit | Attending: Family Medicine | Admitting: Family Medicine

## 2018-12-18 ENCOUNTER — Other Ambulatory Visit: Payer: Self-pay

## 2018-12-18 DIAGNOSIS — Z1231 Encounter for screening mammogram for malignant neoplasm of breast: Secondary | ICD-10-CM

## 2018-12-29 ENCOUNTER — Other Ambulatory Visit: Payer: Self-pay | Admitting: Family Medicine

## 2018-12-29 DIAGNOSIS — I10 Essential (primary) hypertension: Secondary | ICD-10-CM

## 2018-12-29 MED ORDER — LOSARTAN POTASSIUM 50 MG PO TABS: 50.0000 mg | ORAL_TABLET | Freq: Every day | ORAL | 1 refills | Status: DC

## 2018-12-29 NOTE — Telephone Encounter (Signed)
Copied from [id number] [id number]. Topic: Quick Communication - Rx Refill/Question >> Dec 29, 2018 10:39 AM Rainey Pines A wrote: Medication: losartan (COZAAR) 50 MG tablet(Patient stated that she needs medication sent over to pharmacy)  Has the patient contacted their pharmacy? Yes (Agent: If no, request that the patient contact the pharmacy for the refill.) (Agent: If yes, when and what did the pharmacy advise?)Contact PCP  Preferred Pharmacy (with phone number or street name): [facility], [place] [phone] (Phone) [phone] (Fax)    Agent: Please be advised that RX refills may take up to 3 business days. We ask that you follow-up with your pharmacy.

## 2019-01-05 ENCOUNTER — Telehealth: Payer: Self-pay | Admitting: *Deleted

## 2019-01-05 NOTE — Telephone Encounter (Signed)
Patient given results on 12/30/2018 by Roselyn Reef, RN.  Copied from [place] [id number]. Topic: General - Other >> Dec 29, 2018 10:41 AM Rainey Pines A wrote: Patient stated that she would like a callback from nurse to go over her bone density exam results.

## 2019-05-11 DIAGNOSIS — Z20828 Contact with and (suspected) exposure to other viral communicable diseases: Secondary | ICD-10-CM | POA: Diagnosis not present

## 2019-05-15 ENCOUNTER — Encounter: Payer: Self-pay | Admitting: Family Medicine

## 2019-05-15 ENCOUNTER — Telehealth: Payer: Self-pay | Admitting: Family Medicine

## 2019-05-15 ENCOUNTER — Telehealth (INDEPENDENT_AMBULATORY_CARE_PROVIDER_SITE_OTHER): Payer: Medicare Other | Admitting: Family Medicine

## 2019-05-15 VITALS — Temp 98.1°F

## 2019-05-15 DIAGNOSIS — U071 COVID-19: Secondary | ICD-10-CM | POA: Diagnosis not present

## 2019-05-15 NOTE — Telephone Encounter (Signed)
I called and spoke with pt. Her symptoms started Sunday. She got tested Monday, and the result was positive. She is having chills, low grade fever (no fever today), cough (not bad), and she has lost her smell. Her taste comes and goes. Offered pt a virtual appt with pcp at 3:30 and pt accepted.

## 2019-05-15 NOTE — Progress Notes (Signed)
Virtual Visit via Telephone Note  I connected with [name] on 05/15/19 at  3:30 PM EST by telephone and verified that I am speaking with the correct person using two identifiers.   I discussed the limitations, risks, security and privacy concerns of performing an evaluation and management service by telephone and the availability of in person appointments. I also discussed with the patient that there may be a patient responsible charge related to this service. The patient expressed understanding and agreed to proceed.  Location patient: home Location provider: work office Participants present for the call: patient, provider Patient did not have a visit in the prior 7 days to address this/these issue(s).   History of Present Illness: [name] is a 69 year old female with history of hypertension and GERD who recently was diagnosed with COVID-19 infection. She started with symptoms on 05/09/2019, COVID-19 test was done on 05/10/2019, and on 05/11/2019 she received a positive COVID-19 test. In general she is feeling better. Temp 100.55F x 1 days. She has had some cough but she also has history of chronic cough, ir seems to be at base line.  Chills, fatigue, and sore throat(it felt "funny"). Sore throat and chills have resolved.  Still feeling fatigued, exacerbated by certain activities around her house. Negative for CP, palpitations, wheezing, dyspnea, abdominal pain, N/V, changes in bowel habits, urinary symptoms, or a skin rash.  Still not able to smell. Intermittent ageusia. She has taken Tylenol x2. She is not sure about sick contact.  She has a few questions about COVID-19 infection, recovery, and vaccination. That she still needs COVID-19 vaccine? Some people have had prescription for prednisone and azithromycin, she wonders if she needs to take these medications. Also questions about time of quarantine.   Observations/Objective: Patient sounds cheerful and well on  the phone. I do not appreciate any SOB. Speech and thought processing are grossly intact. Patient reported vitals:  Assessment and Plan:  1. COVID-19 virus infection In general she is reporting feeling better. Still has some symptoms, including anosmia and some ageusia. New monitoring temperature and signs of complication. Adequate hydration and rest. Explained that fatigue can last a few more weeks.  I still recommend COVID-19 vaccination, she can wait 2 to 3 months. Explained that the azithromycin and prednisone are not indicated at this time. She works from home. Quarantine until all symptoms resolved and no fever x 3-4 days.  I answered all questions to the best of my ability.   Follow Up Instructions:  Return if symptoms worsen or fail to improve.  I did not refer this patient for an OV in the next 24 hours for this/these issue(s).  I discussed the assessment and treatment plan with the patient. [name] was provided an opportunity to ask questions and all were answered. The patient agreed with the plan and demonstrated an understanding of the instructions.    I provided 15 minutes of non-face-to-face time during this encounter.   Daziya Redmond Martinique, MD

## 2019-05-15 NOTE — Telephone Encounter (Signed)
The patient had a positive covid test on Monday and now has mild symptoms patient would like to know what Dr. Martinique recommends taking for her covid symptoms contact number is [phone].

## 2019-05-28 DIAGNOSIS — Z20828 Contact with and (suspected) exposure to other viral communicable diseases: Secondary | ICD-10-CM | POA: Diagnosis not present

## 2019-06-01 ENCOUNTER — Telehealth: Payer: Self-pay | Admitting: *Deleted

## 2019-06-01 ENCOUNTER — Telehealth: Payer: Self-pay | Admitting: Family Medicine

## 2019-06-01 NOTE — Telephone Encounter (Signed)
Patient called after hours line. Patient reports Caller reports she had a positive COVID 05/11/19 and and tested again this week and her test results were positive again. Caller reports a runny nose with head congestion. Caller is wanting information on how long the positive results last. Patient moved from [place] to [place] but still uses Dr. Martinique.

## 2019-06-01 NOTE — Telephone Encounter (Signed)
Duplicate note, see other note.

## 2019-06-01 NOTE — Telephone Encounter (Signed)
The CDC does not recommend repeating COVID-19 test. As far as she has not had fever for 4 to 5 days and symptoms have resolved,she can go back to work.  Thanks, BJ

## 2019-06-01 NOTE — Telephone Encounter (Signed)
Please advise 

## 2019-06-01 NOTE — Telephone Encounter (Signed)
I spoke with pt. We went over the information below, she verbalized understanding. Note for work printed & mailed to pt's new address.

## 2019-06-01 NOTE — Telephone Encounter (Signed)
Pt tested positive for COVID on 05/11/19. She had symptoms then. Her employer wanted her to get another test and that was on 05/28/19 and it came back positive. She read on the CDC guidelines that you can test positive for up to 3 months after having it and not be contagious. When she spoke to the nurse over the weekend she stated that as long as she test positive that she is contagious. Pt would like clarity. She is not expeircing any symptoms since 05/25/19   Pt can be reached at [phone]

## 2019-06-15 DIAGNOSIS — F329 Major depressive disorder, single episode, unspecified: Secondary | ICD-10-CM | POA: Diagnosis not present

## 2019-06-15 DIAGNOSIS — I10 Essential (primary) hypertension: Secondary | ICD-10-CM | POA: Diagnosis not present

## 2019-06-17 DIAGNOSIS — I10 Essential (primary) hypertension: Secondary | ICD-10-CM | POA: Diagnosis not present

## 2019-06-17 DIAGNOSIS — Z79899 Other long term (current) drug therapy: Secondary | ICD-10-CM | POA: Diagnosis not present

## 2019-06-17 DIAGNOSIS — Z0000 Encounter for general adult medical examination without abnormal findings: Secondary | ICD-10-CM | POA: Diagnosis not present

## 2019-06-29 IMAGING — DX DG CERVICAL SPINE COMPLETE 4+V
5 series · 5 of 5 positions shown · non-contrast
Comparison: None.

CLINICAL DATA: Left-sided neck and shoulder pain for 2 weeks, no
known injury, initial encounter

EXAM:
CERVICAL SPINE - COMPLETE 4+ VIEW

[c-spine lat]
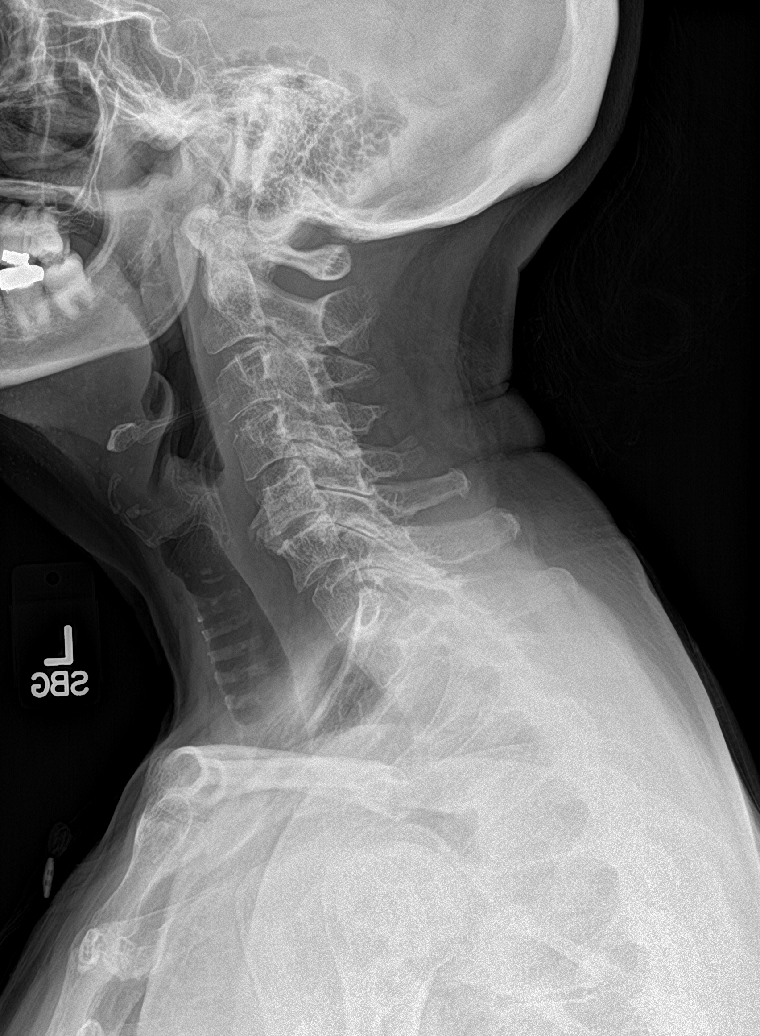

[c-spine obl (1 of 2)]
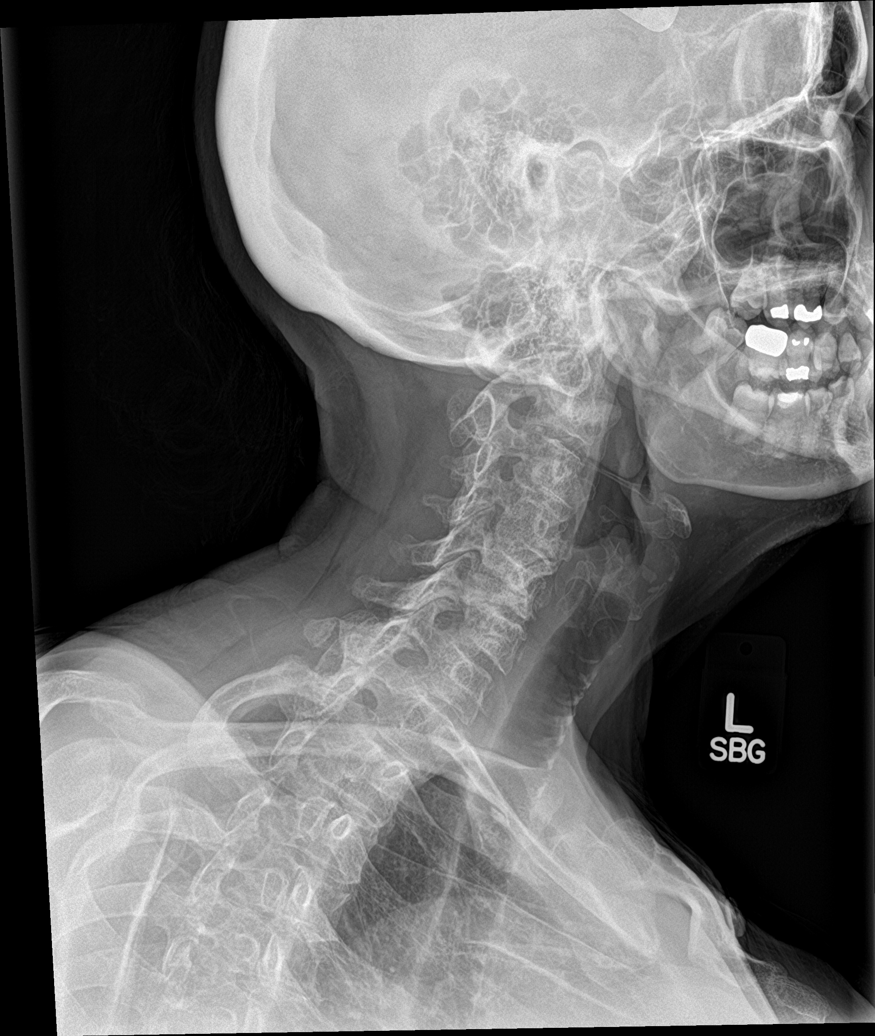

[c-spine obl (2 of 2)]
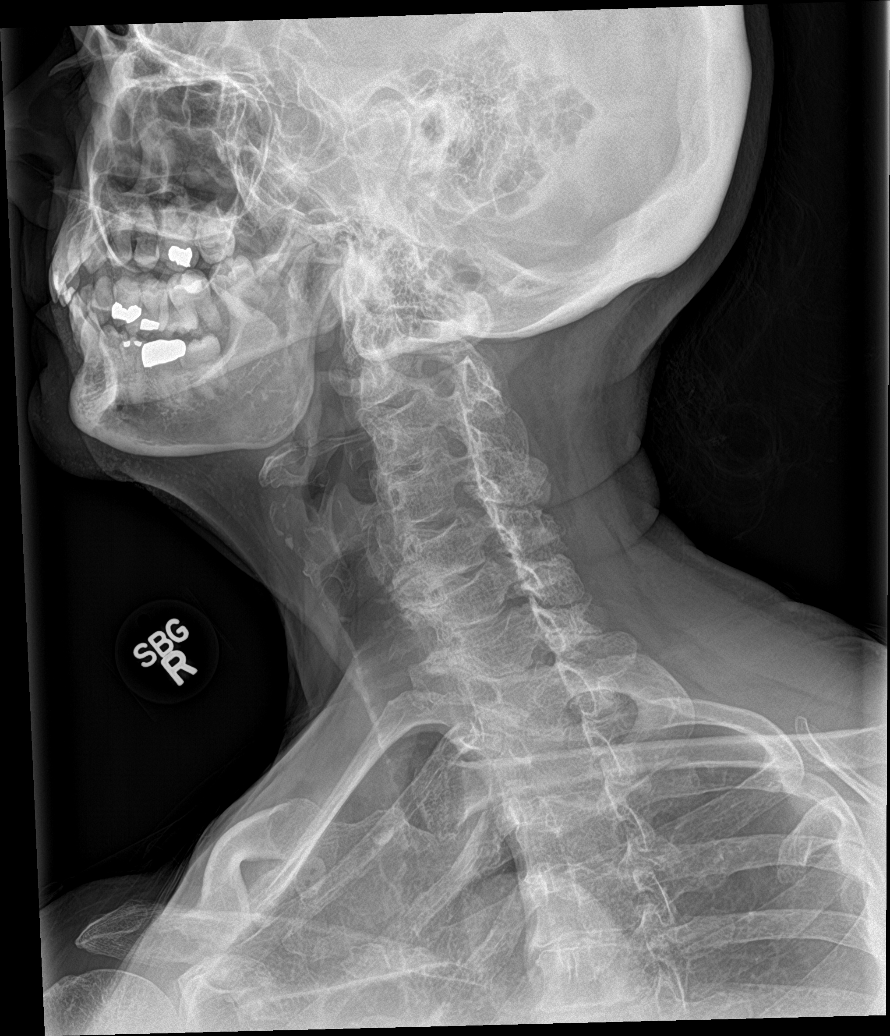

[c-spine ap]
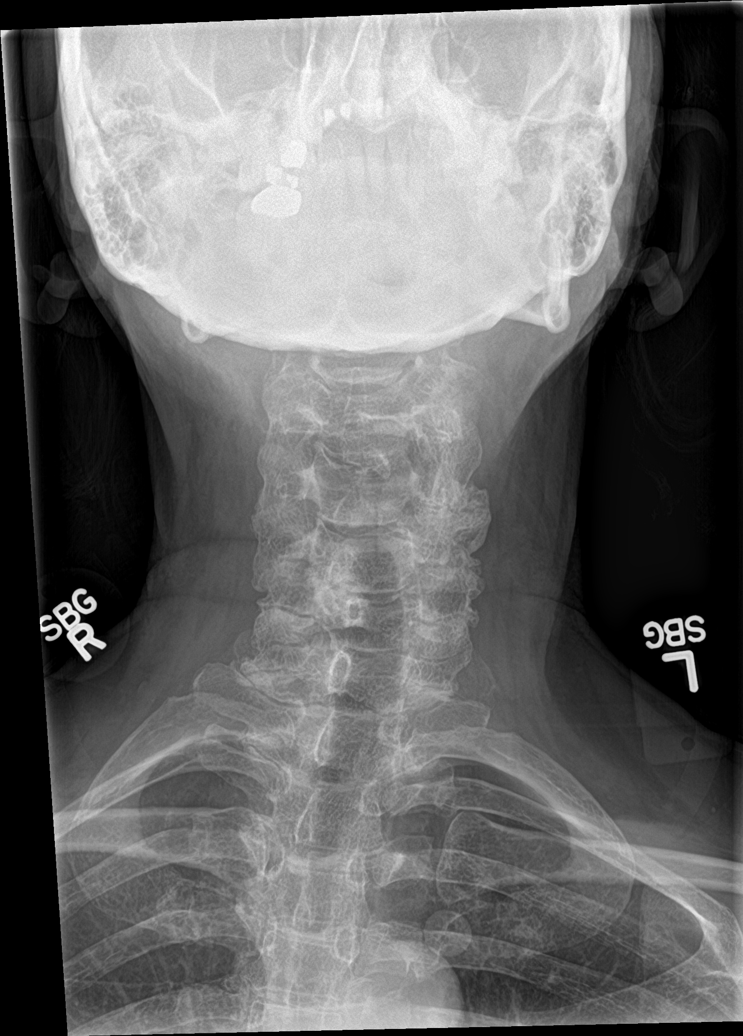

[c-spine open mouth]
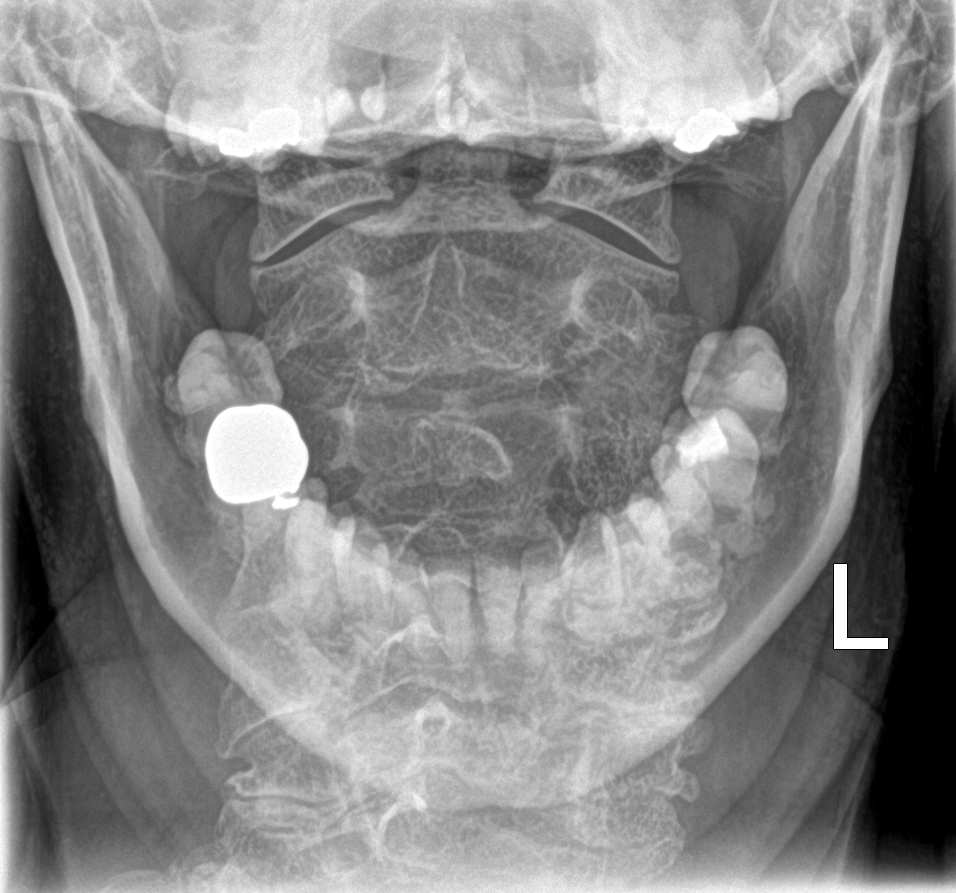

[5 of 5 positions shown; findings below may reference images not displayed]

FINDINGS: Seven cervical segments are well visualized. Vertebral body height
is well maintained. Osteophytes are noted most prominently at C5-6.
Facet hypertrophic changes are noted with associated neural
foraminal narrowing bilaterally. No soft tissue abnormality is
noted. The odontoid is within normal limits.
IMPRESSION: Multilevel degenerative change without acute abnormality.

## 2019-07-17 DIAGNOSIS — L608 Other nail disorders: Secondary | ICD-10-CM | POA: Diagnosis not present

## 2019-07-17 DIAGNOSIS — L820 Inflamed seborrheic keratosis: Secondary | ICD-10-CM | POA: Diagnosis not present

## 2019-07-17 DIAGNOSIS — D1801 Hemangioma of skin and subcutaneous tissue: Secondary | ICD-10-CM | POA: Diagnosis not present

## 2019-07-17 DIAGNOSIS — Z85828 Personal history of other malignant neoplasm of skin: Secondary | ICD-10-CM | POA: Diagnosis not present

## 2019-07-17 DIAGNOSIS — L918 Other hypertrophic disorders of the skin: Secondary | ICD-10-CM | POA: Diagnosis not present

## 2019-07-17 DIAGNOSIS — L905 Scar conditions and fibrosis of skin: Secondary | ICD-10-CM | POA: Diagnosis not present

## 2019-07-17 DIAGNOSIS — L821 Other seborrheic keratosis: Secondary | ICD-10-CM | POA: Diagnosis not present

## 2019-07-17 DIAGNOSIS — L570 Actinic keratosis: Secondary | ICD-10-CM | POA: Diagnosis not present

## 2019-07-17 DIAGNOSIS — D2239 Melanocytic nevi of other parts of face: Secondary | ICD-10-CM | POA: Diagnosis not present

## 2019-10-01 ENCOUNTER — Telehealth: Payer: Self-pay | Admitting: Family Medicine

## 2019-10-01 NOTE — Telephone Encounter (Signed)
Left message for patient to schedule Annual Wellness Visit.  Please schedule with Nurse Health Advisor Tina Betterson, RN at [facility]  

## 2019-10-02 NOTE — Telephone Encounter (Signed)
Pt lives in [place] now and has established care with another provider.

## 2020-03-18 ENCOUNTER — Telehealth: Payer: Self-pay | Admitting: Family Medicine

## 2020-03-18 NOTE — Telephone Encounter (Signed)
Left message for patient to call back and schedule Medicare Annual Wellness Visit (AWV) either virtually or in office.   Last AWV 09/05/2018  please schedule at anytime with [facility] Nurse Health Advisor 1 or 2   This should be a 45 minute visit.

## 2020-07-06 ENCOUNTER — Ambulatory Visit (INDEPENDENT_AMBULATORY_CARE_PROVIDER_SITE_OTHER): Payer: Medicare Other

## 2020-07-06 ENCOUNTER — Other Ambulatory Visit: Payer: Self-pay

## 2020-07-06 DIAGNOSIS — Z0000 Encounter for general adult medical examination without abnormal findings: Secondary | ICD-10-CM

## 2020-07-06 NOTE — Progress Notes (Addendum)
Virtual Visit via Telephone Note  I connected with  [name] on 07/06/20 at  3:15 PM EDT by telephone and verified that I am speaking with the correct person using two identifiers.  Location: Patient: Home Provider: Office Persons participating in the virtual visit: patient/Nurse Health Advisor   I discussed the limitations, risks, security and privacy concerns of performing an evaluation and management service by telephone and the availability of in person appointments. The patient expressed understanding and agreed to proceed.  Interactive audio and video telecommunications were attempted between this nurse and patient, however failed, due to patient having technical difficulties OR patient did not have access to video capability.  We continued and completed visit with audio only.  Some vital signs may be absent or patient reported.   Willette Brace, LPN    Subjective:   [name] is a 70 y.o. female who presents for Medicare Annual (Subsequent) preventive examination.  Review of Systems     Cardiac Risk Factors include: advanced age (>20men, >32 women);hypertension     Objective:    There were no vitals filed for this visit. There is no height or weight on file to calculate BMI.  Advanced Directives 07/06/2020 08/30/2017 08/16/2016 02/12/2014  Does Patient Have a Medical Advance Directive? Yes Yes Yes Yes  Type of Advance Directive [place] will  Does patient want to make changes to medical advance directive? - - - No - Patient declined  Copy of [place] in Chart? No - copy requested - - -    Current Medications (verified) Outpatient Encounter Medications as of 07/06/2020  Medication Sig  . Calcium Carbonate-Vitamin D 600-200 MG-UNIT TABS Take by mouth.  . losartan (COZAAR) 50 MG tablet Take 1 tablet (50 mg total) by mouth daily.  . Multiple Vitamins-Minerals (MULTIVITAMIN WITH MINERALS) tablet Take  by mouth.  . TURMERIC PO Take by mouth daily.  . vitamin B-12 (CYANOCOBALAMIN) 1000 MCG tablet Take by mouth.   No facility-administered encounter medications on file as of 07/06/2020.    Allergies (verified) Sulfa antibiotics   History: Past Medical History:  Diagnosis Date  . 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 Hypertension   . Skin cancer    skin cancers removed   Past Surgical History:  Procedure Laterality Date  . ABDOMINAL HYSTERECTOMY  2007  . BREAST LUMPECTOMY WITH RADIOACTIVE SEED LOCALIZATION Right 02/18/2014   Procedure: RIGHT BREAST DUCT EXCISION AND RADIOACTIVE SEED LOCALIZED BREAST LUMPECTOMY;  Surgeon: Jackolyn Confer, MD;  Location: [place] [facility];  Service: General;  Laterality: Right;  . BUNIONECTOMY     left  . DILATION AND CURETTAGE OF UTERUS    . DVT  2009   thrombectomy lt ll  . right shoulder  2010   RCR-shoulder  . THROMBECTOMY  2009   left common iliac vein-stent  . TUBAL LIGATION     Family History  Problem Relation Age of Onset  . Diabetes Mother   . Cancer Mother        multiple myloma  . Heart disease Father   . Diabetes Brother   . Diabetes Brother   . Diabetes Brother   . Diabetes Brother   . Diabetes Brother    Social History   Socioeconomic History  . Marital status: Divorced    Spouse name: Not on file  . [name] of children: Not on file  . Years of education: Not  on file  . Highest education level: Not on file  Occupational History  . Not on file  Tobacco Use  . Smoking status: Never Smoker  . Smokeless tobacco: Never Used  Substance and Sexual Activity  . Alcohol use: Yes    Comment: rarely  . Drug use: No  . Sexual activity: Yes    Birth control/protection: Surgical  Other Topics Concern  . Not on file  Social History Narrative  . Not on file   Social Determinants of Health   Financial Resource Strain: Low Risk   . Difficulty of Paying Living Expenses: Not hard at all  Food  Insecurity: No Food Insecurity  . Worried About Charity fundraiser in the Last Year: Never true  . Ran Out of Food in the Last Year: Never true  Transportation Needs: No Transportation Needs  . Lack of Transportation (Medical): No  . Lack of Transportation (Non-Medical): No  Physical Activity: Sufficiently Active  . Days of Exercise per Week: 5 days  . Minutes of Exercise per Session: 30 min  Stress: No Stress Concern Present  . Feeling of Stress : Only a little  Social Connections: Moderately Isolated  . Frequency of Communication with Friends and Family: More than three times a week  . Frequency of Social Gatherings with Friends and Family: Twice a week  . Attends Religious Services: More than 4 times per year  . Active Member of Clubs or Organizations: No  . Attends Archivist Meetings: Never  . Marital Status: Divorced    Tobacco Counseling Counseling given: Not Answered   Clinical Intake:  Pre-visit preparation completed: Yes  Pain : No/denies pain     BMI - recorded: 28.19 Nutritional Status: BMI 25 -29 Overweight Diabetes: No  How often do you need to have someone help you when you read instructions, pamphlets, or other written materials from your doctor or pharmacy?: 1 - Never  Diabetic?No  Interpreter Needed?: No  Information entered by :: Charlott Rakes, LPN   Activities of Daily Living In your present state of health, do you have any difficulty performing the following activities: 07/06/2020  Hearing? N  Vision? N  Difficulty concentrating or making decisions? Y  Comment at times memory  Walking or climbing stairs? N  Dressing or bathing? N  Doing errands, shopping? N  Preparing Food and eating ? N  Using the Toilet? N  In the past six months, have you accidently leaked urine? N  Do you have problems with loss of bowel control? N  Managing your Medications? N  Managing your Finances? N  Housekeeping or managing your Housekeeping? N   Some recent data might be hidden    Patient Care Team: [place], Betty G, MD as PCP - General (Family Medicine)  Indicate any recent Medical Services you may have received from other than [facility] providers in the past year (date may be approximate).     Assessment:   This is a routine wellness examination for [name].  Hearing/Vision screen  Hearing Screening   125Hz  250Hz  500Hz  1000Hz  2000Hz  3000Hz  4000Hz  6000Hz  8000Hz   Right ear:           Left ear:           Comments: Pt denies any hearing issues   Vision Screening Comments: Pt will follow up with [facility] for annual eye exams   Dietary issues and exercise activities discussed: Current Exercise Habits: Home exercise routine, Type of exercise: walking, Time (Minutes): 30, Frequency (  Times/Week): 5, Weekly Exercise (Minutes/Week): 150  Goals    . Exercise 150 minutes per week (moderate activity)     Walking 5 times a week To Feel better and have more energy  Consider pickle ball  Will take a day off next year    . Patient Stated     Will try to start playing pickle ball this year!      . Patient Stated     Lose weight and eat better      Depression Screen PHQ 2/9 Scores 07/06/2020 09/05/2018 08/30/2017 08/16/2016 08/16/2016  PHQ - 2 Score 1 0 0 0 0    Fall Risk Fall Risk  07/06/2020 09/05/2018 08/30/2017 08/16/2016  Falls in the past year? 0 0 No No  [name] falls in past yr: 0 0 - -  Injury with Fall? 0 0 - -  Risk for fall due to : Impaired vision;Impaired balance/gait - - -  Follow up Falls prevention discussed Education provided - -    FALL RISK PREVENTION PERTAINING TO THE HOME:  Any stairs in or around the home? No  If so, are there any without handrails? No  Home free of loose throw rugs in walkways, pet beds, electrical cords, etc? Yes  Adequate lighting in your home to reduce risk of falls? Yes   ASSISTIVE DEVICES UTILIZED TO PREVENT FALLS:  Life alert? No  Use of a cane, walker or w/c? No  Grab bars in the  bathroom? No  Shower chair or bench in shower? No  Elevated toilet seat or a handicapped toilet? No   TIMED UP AND GO:  Was the test performed? No .      Cognitive Function: MMSE - Mini Mental State Exam 08/30/2017 08/16/2016  Not completed: (No Data) (No Data)     6CIT Screen 07/06/2020  What Year? 0 points  What month? 0 points  Count back from 20 0 points  Months in reverse 0 points  Repeat phrase 0 points    Immunizations Immunization History  Administered Date(s) Administered  . Janssen (J&J) SARS-COV-2 Vaccination 07/23/2019  . Moderna Sars-Covid-2 Vaccination 04/04/2020    TDAP status: Up to date  Flu Vaccine status: Declined, Education has been provided regarding the importance of this vaccine but patient still declined. Advised may receive this vaccine at local pharmacy or Health Dept. Aware to provide a copy of the vaccination record if obtained from local pharmacy or Health Dept. Verbalized acceptance and understanding.  Pneumococcal vaccine status: Up to date  Covid-19 vaccine status: Completed vaccines  Qualifies for Shingles Vaccine? Yes   Zostavax completed No   Shingrix Completed?: No.    Education has been provided regarding the importance of this vaccine. Patient has been advised to call insurance company to determine out of pocket expense if they have not yet received this vaccine. Advised may also receive vaccine at local pharmacy or Health Dept. Verbalized acceptance and understanding.  Screening Tests Health Maintenance  Topic Date Due  . Hepatitis C Screening  Never done  . COLONOSCOPY (Pts 45-43yrs Insurance coverage will need to be confirmed)  Never done  . MAMMOGRAM  12/17/2020  . TETANUS/TDAP  04/28/2025  . DEXA SCAN  Completed  . COVID-19 Vaccine  Completed  . HPV VACCINES  Aged Out  . INFLUENZA VACCINE  Discontinued  . PNA vac Low Risk Adult  Discontinued    Health Maintenance  Health Maintenance Due  Topic Date Due  . Hepatitis C  Screening  Never  done  . COLONOSCOPY (Pts 45-58yrs Insurance coverage will need to be confirmed)  Never done    Colorectal cancer screening: No longer required.  per pt and age stated  Mammogram status: Completed 12/18/18. Repeat every year  Bone Density status: Completed 12/10/18. Results reflect: Bone density results: NORMAL. Repeat every 3-5 years.   Additional Screening:  Hepatitis C Screening: does qualify;  Vision Screening: Recommended annual ophthalmology exams for early detection of glaucoma and other disorders of the eye. Is the patient up to date with their annual eye exam?  Yes  Who is the provider or what is the name of the office in which the patient attends annual eye exams? Will follow up [facility]  If pt is not established with a provider, would they like to be referred to a provider to establish care? No .   Dental Screening: Recommended annual dental exams for proper oral hygiene  Community Resource Referral / Chronic Care Management: CRR required this visit?  No   CCM required this visit?  No      Plan:     I have personally reviewed and noted the following in the patient's chart:   . Medical and social history . Use of alcohol, tobacco or illicit drugs  . Current medications and supplements . Functional ability and status . Nutritional status . Physical activity . Advanced directives . List of other physicians . Hospitalizations, surgeries, and ER visits in previous 12 months . Vitals . Screenings to include cognitive, depression, and falls . Referrals and appointments  In addition, I have reviewed and discussed with patient certain preventive protocols, quality metrics, and best practice recommendations. A written personalized care plan for preventive services as well as general preventive health recommendations were provided to patient.     Willette Brace, LPN   7/00/1749   Nurse Notes: None

## 2020-07-06 NOTE — Patient Instructions (Signed)
[name] , Thank you for taking time to come for your Medicare Wellness Visit. I appreciate your ongoing commitment to your health goals. Please review the following plan we discussed and let me know if I can assist you in the future.   Screening recommendations/referrals: Colonoscopy: pt stated was told no longer needed related to age  70: Done 08/17/18 Bone Density: Done 12/10/18 Recommended yearly ophthalmology/optometry visit for glaucoma screening and checkup Recommended yearly dental visit for hygiene and checkup  Vaccinations: Influenza vaccine: Declined and discussed Pneumococcal vaccine: Up to date Tdap vaccine: Up to date Shingles vaccine: Shingrix discussed. Please contact your pharmacy for coverage information.    Covid-19:Done 07/23/19  Advanced directives: Please bring a copy of your health care power of attorney and living will to the office at your convenience.  Conditions/risks identified: Exercise more   Next appointment: Follow up in one year for your annual wellness visit    Preventive Care 65 Years and Older, Female Preventive care refers to lifestyle choices and visits with your health care provider that can promote health and wellness. What does preventive care include?  A yearly physical exam. This is also called an annual well check.  Dental exams once or twice a year.  Routine eye exams. Ask your health care provider how often you should have your eyes checked.  Personal lifestyle choices, including:  Daily care of your teeth and gums.  Regular physical activity.  Eating a healthy diet.  Avoiding tobacco and drug use.  Limiting alcohol use.  Practicing safe sex.  Taking low-dose aspirin every day.  Taking vitamin and mineral supplements as recommended by your health care provider. What happens during an annual well check? The services and screenings done by your health care provider during your annual well check will depend on your  age, overall health, lifestyle risk factors, and family history of disease. Counseling  Your health care provider may ask you questions about your:  Alcohol use.  Tobacco use.  Drug use.  Emotional well-being.  Home and relationship well-being.  Sexual activity.  Eating habits.  History of falls.  Memory and ability to understand (cognition).  Work and work Statistician.  Reproductive health. Screening  You may have the following tests or measurements:  Height, weight, and BMI.  Blood pressure.  Lipid and cholesterol levels. These may be checked every 5 years, or more frequently if you are over 60 years old.  Skin check.  Lung cancer screening. You may have this screening every year starting at age 62 if you have a 30-pack-year history of smoking and currently smoke or have quit within the past 15 years.  Fecal occult blood test (FOBT) of the stool. You may have this test every year starting at age 39.  Flexible sigmoidoscopy or colonoscopy. You may have a sigmoidoscopy every 5 years or a colonoscopy every 10 years starting at age 42.  Hepatitis C blood test.  Hepatitis B blood test.  Sexually transmitted disease (STD) testing.  Diabetes screening. This is done by checking your blood sugar (glucose) after you have not eaten for a while (fasting). You may have this done every 1-3 years.  Bone density scan. This is done to screen for osteoporosis. You may have this done starting at age 85.  Mammogram. This may be done every 1-2 years. Talk to your health care provider about how often you should have regular mammograms. Talk with your health care provider about your test results, treatment options, and if necessary,  the need for more tests. Vaccines  Your health care provider may recommend certain vaccines, such as:  Influenza vaccine. This is recommended every year.  Tetanus, diphtheria, and acellular pertussis (Tdap, Td) vaccine. You may need a Td booster  every 10 years.  Zoster vaccine. You may need this after age 46.  Pneumococcal 13-valent conjugate (PCV13) vaccine. One dose is recommended after age 33.  Pneumococcal polysaccharide (PPSV23) vaccine. One dose is recommended after age 23. Talk to your health care provider about which screenings and vaccines you need and how often you need them. This information is not intended to replace advice given to you by your health care provider. Make sure you discuss any questions you have with your health care provider. Document Released: 04/29/2015 Document Revised: 12/21/2015 Document Reviewed: 02/01/2015 Elsevier Interactive Patient Education  2017 [place] Prevention in the Home Falls can cause injuries. They can happen to people of all ages. There are many things you can do to make your home safe and to help prevent falls. What can I do on the outside of my home?  Regularly fix the edges of walkways and driveways and fix any cracks.  Remove anything that might make you trip as you walk through a door, such as a raised step or threshold.  Trim any bushes or trees on the path to your home.  Use bright outdoor lighting.  Clear any walking paths of anything that might make someone trip, such as rocks or tools.  Regularly check to see if handrails are loose or broken. Make sure that both sides of any steps have handrails.  Any raised decks and porches should have guardrails on the edges.  Have any leaves, snow, or ice cleared regularly.  Use sand or salt on walking paths during winter.  Clean up any spills in your garage right away. This includes oil or grease spills. What can I do in the bathroom?  Use night lights.  Install grab bars by the toilet and in the tub and shower. Do not use towel bars as grab bars.  Use non-skid mats or decals in the tub or shower.  If you need to sit down in the shower, use a plastic, non-slip stool.  Keep the floor dry. Clean up any  water that spills on the floor as soon as it happens.  Remove soap buildup in the tub or shower regularly.  Attach bath mats securely with double-sided non-slip rug tape.  Do not have throw rugs and other things on the floor that can make you trip. What can I do in the bedroom?  Use night lights.  Make sure that you have a light by your bed that is easy to reach.  Do not use any sheets or blankets that are too big for your bed. They should not hang down onto the floor.  Have a firm chair that has side arms. You can use this for support while you get dressed.  Do not have throw rugs and other things on the floor that can make you trip. What can I do in the kitchen?  Clean up any spills right away.  Avoid walking on wet floors.  Keep items that you use a lot in easy-to-reach places.  If you need to reach something above you, use a strong step stool that has a grab bar.  Keep electrical cords out of the way.  Do not use floor polish or wax that makes floors slippery. If you must use  wax, use non-skid floor wax.  Do not have throw rugs and other things on the floor that can make you trip. What can I do with my stairs?  Do not leave any items on the stairs.  Make sure that there are handrails on both sides of the stairs and use them. Fix handrails that are broken or loose. Make sure that handrails are as long as the stairways.  Check any carpeting to make sure that it is firmly attached to the stairs. Fix any carpet that is loose or worn.  Avoid having throw rugs at the top or bottom of the stairs. If you do have throw rugs, attach them to the floor with carpet tape.  Make sure that you have a light switch at the top of the stairs and the bottom of the stairs. If you do not have them, ask someone to add them for you. What else can I do to help prevent falls?  Wear shoes that:  Do not have high heels.  Have rubber bottoms.  Are comfortable and fit you well.  Are closed  at the toe. Do not wear sandals.  If you use a stepladder:  Make sure that it is fully opened. Do not climb a closed stepladder.  Make sure that both sides of the stepladder are locked into place.  Ask someone to hold it for you, if possible.  Clearly mark and make sure that you can see:  Any grab bars or handrails.  First and last steps.  Where the edge of each step is.  Use tools that help you move around (mobility aids) if they are needed. These include:  Canes.  Walkers.  Scooters.  Crutches.  Turn on the lights when you go into a dark area. Replace any light bulbs as soon as they burn out.  Set up your furniture so you have a clear path. Avoid moving your furniture around.  If any of your floors are uneven, fix them.  If there are any pets around you, be aware of where they are.  Review your medicines with your doctor. Some medicines can make you feel dizzy. This can increase your chance of falling. Ask your doctor what other things that you can do to help prevent falls. This information is not intended to replace advice given to you by your health care provider. Make sure you discuss any questions you have with your health care provider. Document Released: 01/27/2009 Document Revised: 09/08/2015 Document Reviewed: 05/07/2014 Elsevier Interactive Patient Education  2017 [employer].

## 2020-09-30 ENCOUNTER — Ambulatory Visit: Payer: Medicare Other | Admitting: Family Medicine

## 2020-10-04 ENCOUNTER — Telehealth: Payer: Self-pay | Admitting: Family Medicine

## 2020-10-04 DIAGNOSIS — I10 Essential (primary) hypertension: Secondary | ICD-10-CM

## 2020-10-04 MED ORDER — LOSARTAN POTASSIUM 50 MG PO TABS: 50.0000 mg | ORAL_TABLET | Freq: Every day | ORAL | 1 refills | Status: DC

## 2020-10-04 NOTE — Telephone Encounter (Signed)
Pt call and stated she need a refill on losartan (COZAAR) 50 MG tablet sent to [facility].

## 2020-10-04 NOTE — Telephone Encounter (Signed)
Rx sent in

## 2020-10-07 ENCOUNTER — Encounter: Payer: Self-pay | Admitting: Family Medicine

## 2020-10-07 ENCOUNTER — Ambulatory Visit (INDEPENDENT_AMBULATORY_CARE_PROVIDER_SITE_OTHER): Payer: Medicare Other | Admitting: Family Medicine

## 2020-10-07 ENCOUNTER — Other Ambulatory Visit: Payer: Self-pay

## 2020-10-07 VITALS — BP 132/86 | HR 70 | Temp 98.2°F | Resp 16 | Ht 67.0 in | Wt 174.6 lb

## 2020-10-07 DIAGNOSIS — R519 Headache, unspecified: Secondary | ICD-10-CM

## 2020-10-07 DIAGNOSIS — I10 Essential (primary) hypertension: Secondary | ICD-10-CM

## 2020-10-07 DIAGNOSIS — M25562 Pain in left knee: Secondary | ICD-10-CM | POA: Diagnosis not present

## 2020-10-07 LAB — CBC
HCT: 40.4 % (ref 36.0–46.0)
Hemoglobin: 13.8 g/dL (ref 12.0–15.0)
MCHC: 34.2 g/dL (ref 30.0–36.0)
MCV: 93.8 fl (ref 78.0–100.0)
Platelets: 174 10*3/uL (ref 150.0–400.0)
RBC: 4.3 Mil/uL (ref 3.87–5.11)
RDW: 12.5 % (ref 11.5–15.5)
WBC: 3.5 10*3/uL — ABNORMAL LOW (ref 4.0–10.5)

## 2020-10-07 LAB — URINALYSIS, ROUTINE W REFLEX MICROSCOPIC
Bilirubin Urine: NEGATIVE
Hgb urine dipstick: NEGATIVE
Ketones, ur: NEGATIVE
Leukocytes,Ua: NEGATIVE
Nitrite: NEGATIVE
Specific Gravity, Urine: 1.025 (ref 1.000–1.030)
Total Protein, Urine: NEGATIVE
Urine Glucose: NEGATIVE
Urobilinogen, UA: 0.2 (ref 0.0–1.0)
pH: 6 (ref 5.0–8.0)

## 2020-10-07 LAB — BASIC METABOLIC PANEL
BUN: 18 mg/dL (ref 6–23)
CO2: 30 mEq/L (ref 19–32)
Calcium: 9.8 mg/dL (ref 8.4–10.5)
Chloride: 106 mEq/L (ref 96–112)
Creatinine, Ser: 0.81 mg/dL (ref 0.40–1.20)
GFR: 73.4 mL/min (ref >60.00)
Glucose, Bld: 94 mg/dL (ref 70–99)
Potassium: 4.3 mEq/L (ref 3.5–5.1)
Sodium: 142 mEq/L (ref 135–145)

## 2020-10-07 LAB — HEPATIC FUNCTION PANEL
ALT: 11 U/L (ref 0–35)
AST: 17 U/L (ref 0–37)
Albumin: 4.2 g/dL (ref 3.5–5.2)
Alkaline Phosphatase: 52 U/L (ref 39–117)
Bilirubin, Direct: 0.2 mg/dL (ref 0.0–0.3)
Total Bilirubin: 1.1 mg/dL (ref 0.2–1.2)
Total Protein: 6.6 g/dL (ref 6.0–8.3)

## 2020-10-07 MED ORDER — DICLOFENAC SODIUM 1 % EX GEL: 4.0000 g | Freq: Four times a day (QID) | CUTANEOUS | 2 refills | Status: DC

## 2020-10-07 NOTE — Progress Notes (Signed)
Chief Complaint  Patient presents with   Headache    Patient complains of headaches, Patient stated headaches are acute and are not frequent, No recent headaches as of today, Patient states last headaches was over 1 month ago,    HPI: [name] is a 70 y.o. female, who is here today complaining of headache as described above. [name] has not had headaches in the past days.  [name] gets a migraine occasionally but this was a different type of headache. Pounding generalized headache noted when [name] woke up.  [name] followed with provider in [place], recommended checking BP and it was up, 200/100, [name] did not check again.  One headache lasted 15-20 min but usually headache  lasted a couple minutes. No associated visual changes,photophobia,nausea,vomiting,or neurologic deficit.  [name] is not sure about exacerbating or alleviating factors. [name] was under some stress. [name] did not try medication for headache.  [name] does not feel rested in the morning when [name] gets up. No known OSA. Nocturia x 2-3.  HTN: [name] is on Losartan 50 mg daily. Negative for chest pain, dyspnea, palpitation, claudication, or edema.  Left knee pain taking Ibuprofen 400 mg tid with food.Following with ortho, arthroscopic procedure was recommended.Pending knee MRI.  Concerned about possible serious health problem. In the past 17 months [name] lost 3 bothers: CKD,Alzheimer,and the 3rd one ? Cancer. Another brother dx'ed with colon cancer on chemo. Another brother just Dx with multiple myeloma and her mother died from multiple myeloma.  Review of Systems  Constitutional:  Positive for fatigue. Negative for activity change, appetite change and fever.  HENT:  Negative for mouth sores, nosebleeds and trouble swallowing.   Respiratory:  Negative for cough and wheezing.   Gastrointestinal:  Negative for abdominal pain, nausea and vomiting.       Negative for changes in bowel habits.  Genitourinary:  Negative for decreased  urine volume, dysuria and hematuria.  Neurological:  Negative for syncope, facial asymmetry, weakness and numbness.  Hematological:  Negative for adenopathy. Does not bruise/bleed easily.  Psychiatric/Behavioral:  Negative for confusion. The patient is nervous/anxious.   Rest see pertinent positives and negatives per HPI.  Current Outpatient Medications on File Prior to Visit  Medication Sig Dispense Refill   Calcium Carbonate-Vitamin D 600-200 MG-UNIT TABS Take by mouth.     losartan (COZAAR) 50 MG tablet Take 1 tablet (50 mg total) by mouth daily. 90 tablet 1   Multiple Vitamins-Minerals (MULTIVITAMIN WITH MINERALS) tablet Take by mouth.     TURMERIC PO Take by mouth daily.     vitamin B-12 (CYANOCOBALAMIN) 1000 MCG tablet Take by mouth.     No current facility-administered medications on file prior to visit.   Past Medical History:  Diagnosis Date   History of DVT of lower extremity 2007,2009   left   Hypertension    Skin cancer    skin cancers removed   Allergies  Allergen Reactions   Sulfa Antibiotics Rash    Social History   Socioeconomic History   Marital status: Divorced    Spouse name: Not on file   Number of children: Not on file   Years of education: Not on file   Highest education level: Not on file  Occupational History   Not on file  Tobacco Use   Smoking status: Never   Smokeless tobacco: Never  Substance and Sexual Activity   Alcohol use: Yes    Comment: rarely   Drug use: No   Sexual activity:  Yes    Birth control/protection: Surgical  Other Topics Concern   Not on file  Social History Narrative   Not on file   Social Determinants of Health   Financial Resource Strain: Low Risk    Difficulty of Paying Living Expenses: Not hard at all  Food Insecurity: No Food Insecurity   Worried About Charity fundraiser in the Last Year: Never true   [place] in the Last Year: Never true  Transportation Needs: No Transportation Needs   Lack of  Transportation (Medical): No   Lack of Transportation (Non-Medical): No  Physical Activity: Sufficiently Active   Days of Exercise per Week: 5 days   Minutes of Exercise per Session: 30 min  Stress: No Stress Concern Present   Feeling of Stress : Only a little  Social Connections: Moderately Isolated   Frequency of Communication with Friends and Family: More than three times a week   Frequency of Social Gatherings with Friends and Family: Twice a week   Attends Religious Services: More than 4 times per year   Active Member of Clubs or Organizations: No   Attends Archivist Meetings: Never   Marital Status: Divorced    Vitals:   10/07/20 0846  BP: 132/86  Pulse: 70  Resp: 16  Temp: 98.2 F (36.8 C)  SpO2: 95%   Body mass index is 27.35 kg/m.  Physical Exam Vitals and nursing note reviewed.  Constitutional:      General: [name] is not in acute distress.    Appearance: [name] is well-developed.  HENT:     Head: Normocephalic and atraumatic.     Mouth/Throat:     Mouth: Mucous membranes are moist.     Pharynx: Oropharynx is clear.  Eyes:     Conjunctiva/sclera: Conjunctivae normal.  Cardiovascular:     Rate and Rhythm: Normal rate and regular rhythm.     Pulses:          Dorsalis pedis pulses are 2+ on the right side and 2+ on the left side.     Heart sounds: No murmur heard. Pulmonary:     Effort: Pulmonary effort is normal. No respiratory distress.     Breath sounds: Normal breath sounds.  Abdominal:     Palpations: Abdomen is soft. There is no hepatomegaly or mass.     Tenderness: There is no abdominal tenderness.  Lymphadenopathy:     Cervical: No cervical adenopathy.  Skin:    General: Skin is warm.     Findings: No erythema or rash.  Neurological:     General: No focal deficit present.     Mental Status: [name] is alert and oriented to person, place, and time.     Cranial Nerves: No cranial nerve deficit.     Gait: Gait abnormal (Antalgic,limping.).   Psychiatric:        Mood and Affect: Mood is anxious.     Comments: Well groomed, good eye contact.   ASSESSMENT AND PLAN:  [name] was seen today for headache.  Diagnoses and all orders for this visit: Orders Placed This Encounter  Procedures   Basic metabolic panel   CBC   Hepatic function panel   Urinalysis, Routine w reflex microscopic   Lab Results  Component Value Date   WBC 3.5 (L) 10/07/2020   HGB 13.8 10/07/2020   HCT 40.4 10/07/2020   MCV 93.8 10/07/2020   PLT 174.0 10/07/2020   Lab Results  Component Value Date  CREATININE 0.81 10/07/2020   BUN 18 10/07/2020   NA 142 10/07/2020   K 4.3 10/07/2020   CL 106 10/07/2020   CO2 30 10/07/2020   Lab Results  Component Value Date   ALT 11 10/07/2020   AST 17 10/07/2020   ALKPHOS 52 10/07/2020   BILITOT 1.1 10/07/2020    Essential hypertension, benign BP adequately controlled.Continue Losartan 50 mg daily. Monitor BP regularly. Low salt diet. Some side effects of NSAID's discussed.  Headache, unspecified headache type Possible etiologies discussed. Hx and examination do not suggest a serious problem. It seems resolved. ? Tension like headache.  Acute pain of left knee Stop oral NSAID. Tylenol 500 mg 3-4 times per day prn. Topical Voltaren may help. Following with ortho.  -     diclofenac Sodium (VOLTAREN) 1 % GEL; Apply 4 g topically 4 (four) times daily.  Return in about 3 months (around 01/07/2021).   Katheryn Culliton G. Martinique, MD  [facility]. [place] office.    A few things to remember from today's visit:  Essential hypertension, benign - Plan: Basic metabolic panel, CBC, Hepatic function panel  Headache, unspecified headache type  If you need refills please call your pharmacy. Do not use My Chart to request refills or for acute issues that need immediate attention.   Ibuprofen can be increasing blood pressure. I would like to stop Ibuprofen and try Try Tylenol 500 mg 3-4  times per day and topical Voltaren. Monitor blood pressure.  Please be sure medication list is accurate. If a new problem present, please set up appointment sooner than planned today.

## 2020-10-07 NOTE — Patient Instructions (Signed)
A few things to remember from today's visit:  Essential hypertension, benign - Plan: Basic metabolic panel, CBC, Hepatic function panel  Headache, unspecified headache type  If you need refills please call your pharmacy. Do not use My Chart to request refills or for acute issues that need immediate attention.   Ibuprofen can be increasing blood pressure. I would like to stop Ibuprofen and try Try Tylenol 500 mg 3-4 times per day and topical Voltaren. Monitor blood pressure.  Please be sure medication list is accurate. If a new problem present, please set up appointment sooner than planned today.

## 2021-01-04 ENCOUNTER — Telehealth: Payer: Self-pay | Admitting: Family Medicine

## 2021-01-04 NOTE — Telephone Encounter (Signed)
Patient hit the cancel button on automated call to cancel or confirm appointment. Patient wants it noted that she is not cancelling appointment so there is no need for call back to cancel or reschedule.

## 2021-01-04 NOTE — Telephone Encounter (Signed)
Noted  

## 2021-01-09 ENCOUNTER — Ambulatory Visit: Payer: Medicare Other | Admitting: Family Medicine

## 2021-01-16 ENCOUNTER — Ambulatory Visit (INDEPENDENT_AMBULATORY_CARE_PROVIDER_SITE_OTHER): Payer: Medicare Other | Admitting: Family Medicine

## 2021-01-16 ENCOUNTER — Encounter: Payer: Self-pay | Admitting: Family Medicine

## 2021-01-16 ENCOUNTER — Other Ambulatory Visit: Payer: Self-pay

## 2021-01-16 VITALS — BP 126/80 | HR 81 | Resp 16 | Ht 67.0 in | Wt 175.1 lb

## 2021-01-16 DIAGNOSIS — R519 Headache, unspecified: Secondary | ICD-10-CM | POA: Diagnosis not present

## 2021-01-16 DIAGNOSIS — I10 Essential (primary) hypertension: Secondary | ICD-10-CM

## 2021-01-16 DIAGNOSIS — R0683 Snoring: Secondary | ICD-10-CM

## 2021-01-16 DIAGNOSIS — R351 Nocturia: Secondary | ICD-10-CM | POA: Diagnosis not present

## 2021-01-16 MED ORDER — LOSARTAN POTASSIUM 50 MG PO TABS: 50.0000 mg | ORAL_TABLET | Freq: Every day | ORAL | 2 refills | Status: DC

## 2021-01-16 NOTE — Progress Notes (Signed)
HPI: [name] is a 70 y.o. female, who is here today for follow up.   She was last seen on 10/07/2020.  Hypertension:  Medications: Losartan 25 mg daily. BP readings at home: Most are 120s-130s/80s. A few SBP in the low 100's and 140's. Side effects: None.  Negative for visual changes, exertional chest pain, dyspnea,  focal weakness, or edema. She is still having headaches but not frequent,she has had a few since her last visit. Headache is in the morning, resolves spontaneously once she is up. Negative for associated visual changes, nausea, vomiting, or MS changes. She does not feel rested when she gets up in the morning.  She is not sure about OSA.  Her son has called to her attention that she snores loudly and it has waken her up before. Nocturia x 2-3, this is a chronic problem. She has not identified exacerbating or alleviating factors. Negative for associated dysuria, gross hematuria, or decreased urine output. She does not have problems with urinary frequency during the day.  Lab Results  Component Value Date   CREATININE 0.81 10/07/2020   BUN 18 10/07/2020   NA 142 10/07/2020   K 4.3 10/07/2020   CL 106 10/07/2020   CO2 30 10/07/2020   Review of Systems  Constitutional:  Positive for fatigue. Negative for activity change, appetite change and fever.  HENT:  Negative for mouth sores, nosebleeds and sore throat.   Respiratory:  Negative for cough and wheezing.   Gastrointestinal:  Negative for abdominal pain.       Negative for changes in bowel habits.  Neurological:  Negative for syncope, facial asymmetry and weakness.  Psychiatric/Behavioral:  Positive for sleep disturbance. Negative for confusion.   Rest of ROS, see pertinent positives sand negatives in HPI  Current Outpatient Medications on File Prior to Visit  Medication Sig Dispense Refill   Calcium Carbonate-Vitamin D 600-200 MG-UNIT TABS Take by mouth.     diclofenac Sodium (VOLTAREN) 1 %  GEL Apply 4 g topically 4 (four) times daily. 150 g 2   Multiple Vitamins-Minerals (MULTIVITAMIN WITH MINERALS) tablet Take by mouth.     TURMERIC PO Take by mouth daily.     vitamin B-12 (CYANOCOBALAMIN) 1000 MCG tablet Take by mouth.     No current facility-administered medications on file prior to visit.   Past Medical History:  Diagnosis Date   History of DVT of lower extremity 2007,2009   left   Hypertension    Skin cancer    skin cancers removed   Allergies  Allergen Reactions   Sulfa Antibiotics Rash    Social History   Socioeconomic History   Marital status: Divorced    Spouse name: Not on file   Number of children: Not on file   Years of education: Not on file   Highest education level: Not on file  Occupational History   Not on file  Tobacco Use   Smoking status: Never   Smokeless tobacco: Never  Substance and Sexual Activity   Alcohol use: Yes    Comment: rarely   Drug use: No   Sexual activity: Yes    Birth control/protection: Surgical  Other Topics Concern   Not on file  Social History Narrative   Not on file   Social Determinants of Health   Financial Resource Strain: Low Risk    Difficulty of Paying Living Expenses: Not hard at all  Food Insecurity: No Food Insecurity   Worried About Running Out of  Food in the Last Year: Never true   [place] in the Last Year: Never true  Transportation Needs: No Transportation Needs   Lack of Transportation (Medical): No   Lack of Transportation (Non-Medical): No  Physical Activity: Sufficiently Active   Days of Exercise per Week: 5 days   Minutes of Exercise per Session: 30 min  Stress: No Stress Concern Present   Feeling of Stress : Only a little  Social Connections: Moderately Isolated   Frequency of Communication with Friends and Family: More than three times a week   Frequency of Social Gatherings with Friends and Family: Twice a week   Attends Religious Services: More than 4 times per year    Active Member of Clubs or Organizations: No   Attends Archivist Meetings: Never   Marital Status: Divorced   Vitals:   01/16/21 1155  BP: 126/80  Pulse: 81  Resp: 16  SpO2: 98%   Wt Readings from Last 3 Encounters:  01/16/21 175 lb 2 oz (79.4 kg)  10/07/20 174 lb 9.6 oz (79.2 kg)  06/18/18 180 lb (81.6 kg)   Body mass index is 27.43 kg/m.  Physical Exam Vitals and nursing note reviewed.  Constitutional:      General: She is not in acute distress.    Appearance: She is well-developed.  HENT:     Head: Normocephalic and atraumatic.     Mouth/Throat:     Mouth: Mucous membranes are moist.     Pharynx: Oropharynx is clear.  Eyes:     Conjunctiva/sclera: Conjunctivae normal.  Cardiovascular:     Rate and Rhythm: Normal rate and regular rhythm.     Pulses:          Dorsalis pedis pulses are 2+ on the right side and 2+ on the left side.     Heart sounds: No murmur heard. Pulmonary:     Effort: Pulmonary effort is normal. No respiratory distress.     Breath sounds: Normal breath sounds.  Abdominal:     Palpations: Abdomen is soft. There is no hepatomegaly or mass.     Tenderness: There is no abdominal tenderness.  Lymphadenopathy:     Cervical: No cervical adenopathy.  Skin:    General: Skin is warm.     Findings: No erythema or rash.  Neurological:     General: No focal deficit present.     Mental Status: She is alert and oriented to person, place, and time.     Cranial Nerves: No cranial nerve deficit.     Gait: Gait normal.  Psychiatric:     Comments: Well groomed, good eye contact.   ASSESSMENT AND PLAN:  [name] was seen today for follow-up.  Diagnoses and all orders for this visit: Orders Placed This Encounter  Procedures   Ambulatory referral to Sleep Studies   Loud snoring We discussed possible etiologies but because associated morning headaches + fatigue, sleep apnea needs to be considered. Sleep study will be arranged.   We discussed physiopathology of sleep apnea and treatment options.  Essential hypertension, benign BP adequately controlled. Continue losartan 25 mg daily. Continue low-salt diet and monitoring BP regularly.  Headache, unspecified headache type Mild. We discussed possible etiologies. History and examination today do not suggest a serious process. Continue monitoring for new associated symptoms. Instructed about warning signs.  Nocturia Chronic. ?  Overactive bladder.For now we can hold on pharmacologic treatment but later one we could consider medication like Myrbetriq.  On 10/07/2020 UA was otherwise normal, except for presence of calcium oxalate crystals.  Return in about 6 months (around 07/17/2021) for awv and f/u.  Halee Glynn G. Martinique, MD  [facility]. [place] office.

## 2021-01-16 NOTE — Patient Instructions (Addendum)
A few things to remember from today's visit:  Loud snoring - Plan: Ambulatory referral to Sleep Studies  Essential hypertension, benign  Headache, unspecified headache type  If you need refills please call your pharmacy. Do not use My Chart to request refills or for acute issues that need immediate attention.   We will arrange a sleep study to evaluate for sleep apnea. Monitor for new symptoms.  Please be sure medication list is accurate. If a new problem present, please set up appointment sooner than planned today.

## 2021-07-03 DIAGNOSIS — L570 Actinic keratosis: Secondary | ICD-10-CM | POA: Diagnosis not present

## 2021-07-03 DIAGNOSIS — Z85828 Personal history of other malignant neoplasm of skin: Secondary | ICD-10-CM | POA: Diagnosis not present

## 2021-07-03 DIAGNOSIS — D225 Melanocytic nevi of trunk: Secondary | ICD-10-CM | POA: Diagnosis not present

## 2021-07-03 DIAGNOSIS — L821 Other seborrheic keratosis: Secondary | ICD-10-CM | POA: Diagnosis not present

## 2021-07-03 DIAGNOSIS — L814 Other melanin hyperpigmentation: Secondary | ICD-10-CM | POA: Diagnosis not present

## 2021-07-03 DIAGNOSIS — L578 Other skin changes due to chronic exposure to nonionizing radiation: Secondary | ICD-10-CM | POA: Diagnosis not present

## 2021-07-03 DIAGNOSIS — Z86018 Personal history of other benign neoplasm: Secondary | ICD-10-CM | POA: Diagnosis not present

## 2021-07-03 DIAGNOSIS — Z23 Encounter for immunization: Secondary | ICD-10-CM | POA: Diagnosis not present

## 2021-07-10 ENCOUNTER — Ambulatory Visit: Payer: Medicare Other

## 2021-07-12 ENCOUNTER — Ambulatory Visit: Payer: Medicare Other

## 2021-07-17 ENCOUNTER — Ambulatory Visit (INDEPENDENT_AMBULATORY_CARE_PROVIDER_SITE_OTHER): Payer: PPO

## 2021-07-17 VITALS — Ht 67.0 in | Wt 180.0 lb

## 2021-07-17 DIAGNOSIS — Z0000 Encounter for general adult medical examination without abnormal findings: Secondary | ICD-10-CM | POA: Diagnosis not present

## 2021-07-17 DIAGNOSIS — Z1231 Encounter for screening mammogram for malignant neoplasm of breast: Secondary | ICD-10-CM | POA: Diagnosis not present

## 2021-07-17 NOTE — Patient Instructions (Signed)
[name] , ?Thank you for taking time to come for your Medicare Wellness Visit. I appreciate your ongoing commitment to your health goals. Please review the following plan we discussed and let me know if I can assist you in the future.  ? ?Screening recommendations/referrals: ?Colonoscopy: not required ?Mammogram: ordered today ?Bone Density: completed 12/10/2018 ?Recommended yearly ophthalmology/optometry visit for glaucoma screening and checkup ?Recommended yearly dental visit for hygiene and checkup ? ?Vaccinations: ?Influenza vaccine: decline ?Pneumococcal vaccine: decline ?Tdap vaccine: completed 04/29/2015, due 04/28/2025 ?Shingles vaccine: decline   ?Covid-19: 04/04/2020, 07/23/2019 ? ?Advanced directives: Advance directive discussed with you today.  ? ?Conditions/risks identified: none ? ?Next appointment: Follow up in one year for your annual wellness visit  ? ? ?Preventive Care 31 Years and Older, Female ?Preventive care refers to lifestyle choices and visits with your health care provider that can promote health and wellness. ?What does preventive care include? ?A yearly physical exam. This is also called an annual well check. ?Dental exams once or twice a year. ?Routine eye exams. Ask your health care provider how often you should have your eyes checked. ?Personal lifestyle choices, including: ?Daily care of your teeth and gums. ?Regular physical activity. ?Eating a healthy diet. ?Avoiding tobacco and drug use. ?Limiting alcohol use. ?Practicing safe sex. ?Taking low-dose aspirin every day. ?Taking vitamin and mineral supplements as recommended by your health care provider. ?What happens during an annual well check? ?The services and screenings done by your health care provider during your annual well check will depend on your age, overall health, lifestyle risk factors, and family history of disease. ?Counseling  ?Your health care provider may ask you questions about your: ?Alcohol use. ?Tobacco use. ?Drug  use. ?Emotional well-being. ?Home and relationship well-being. ?Sexual activity. ?Eating habits. ?History of falls. ?Memory and ability to understand (cognition). ?Work and work Statistician. ?Reproductive health. ?Screening  ?You may have the following tests or measurements: ?Height, weight, and BMI. ?Blood pressure. ?Lipid and cholesterol levels. These may be checked every 5 years, or more frequently if you are over 67 years old. ?Skin check. ?Lung cancer screening. You may have this screening every year starting at age 77 if you have a 30-pack-year history of smoking and currently smoke or have quit within the past 15 years. ?Fecal occult blood test (FOBT) of the stool. You may have this test every year starting at age 99. ?Flexible sigmoidoscopy or colonoscopy. You may have a sigmoidoscopy every 5 years or a colonoscopy every 10 years starting at age 74. ?Hepatitis C blood test. ?Hepatitis B blood test. ?Sexually transmitted disease (STD) testing. ?Diabetes screening. This is done by checking your blood sugar (glucose) after you have not eaten for a while (fasting). You may have this done every 1-3 years. ?Bone density scan. This is done to screen for osteoporosis. You may have this done starting at age 15. ?Mammogram. This may be done every 1-2 years. Talk to your health care provider about how often you should have regular mammograms. ?Talk with your health care provider about your test results, treatment options, and if necessary, the need for more tests. ?Vaccines  ?Your health care provider may recommend certain vaccines, such as: ?Influenza vaccine. This is recommended every year. ?Tetanus, diphtheria, and acellular pertussis (Tdap, Td) vaccine. You may need a Td booster every 10 years. ?Zoster vaccine. You may need this after age 54. ?Pneumococcal 13-valent conjugate (PCV13) vaccine. One dose is recommended after age 80. ?Pneumococcal polysaccharide (PPSV23) vaccine. One dose is recommended after  age  65. ?Talk to your health care provider about which screenings and vaccines you need and how often you need them. ?This information is not intended to replace advice given to you by your health care provider. Make sure you discuss any questions you have with your health care provider. ?Document Released: 04/29/2015 Document Revised: 12/21/2015 Document Reviewed: 02/01/2015 ?Elsevier Interactive Patient Education ? 2017 [place]. ? ?Fall Prevention in the Home ?Falls can cause injuries. They can happen to people of all ages. There are many things you can do to make your home safe and to help prevent falls. ?What can I do on the outside of my home? ?Regularly fix the edges of walkways and driveways and fix any cracks. ?Remove anything that might make you trip as you walk through a door, such as a raised step or threshold. ?Trim any bushes or trees on the path to your home. ?Use bright outdoor lighting. ?Clear any walking paths of anything that might make someone trip, such as rocks or tools. ?Regularly check to see if handrails are loose or broken. Make sure that both sides of any steps have handrails. ?Any raised decks and porches should have guardrails on the edges. ?Have any leaves, snow, or ice cleared regularly. ?Use sand or salt on walking paths during winter. ?Clean up any spills in your garage right away. This includes oil or grease spills. ?What can I do in the bathroom? ?Use night lights. ?Install grab bars by the toilet and in the tub and shower. Do not use towel bars as grab bars. ?Use non-skid mats or decals in the tub or shower. ?If you need to sit down in the shower, use a plastic, non-slip stool. ?Keep the floor dry. Clean up any water that spills on the floor as soon as it happens. ?Remove soap buildup in the tub or shower regularly. ?Attach bath mats securely with double-sided non-slip rug tape. ?Do not have throw rugs and other things on the floor that can make you trip. ?What can I do in the  bedroom? ?Use night lights. ?Make sure that you have a light by your bed that is easy to reach. ?Do not use any sheets or blankets that are too big for your bed. They should not hang down onto the floor. ?Have a firm chair that has side arms. You can use this for support while you get dressed. ?Do not have throw rugs and other things on the floor that can make you trip. ?What can I do in the kitchen? ?Clean up any spills right away. ?Avoid walking on wet floors. ?Keep items that you use a lot in easy-to-reach places. ?If you need to reach something above you, use a strong step stool that has a grab bar. ?Keep electrical cords out of the way. ?Do not use floor polish or wax that makes floors slippery. If you must use wax, use non-skid floor wax. ?Do not have throw rugs and other things on the floor that can make you trip. ?What can I do with my stairs? ?Do not leave any items on the stairs. ?Make sure that there are handrails on both sides of the stairs and use them. Fix handrails that are broken or loose. Make sure that handrails are as long as the stairways. ?Check any carpeting to make sure that it is firmly attached to the stairs. Fix any carpet that is loose or worn. ?Avoid having throw rugs at the top or bottom of the stairs. If you do  have throw rugs, attach them to the floor with carpet tape. ?Make sure that you have a light switch at the top of the stairs and the bottom of the stairs. If you do not have them, ask someone to add them for you. ?What else can I do to help prevent falls? ?Wear shoes that: ?Do not have high heels. ?Have rubber bottoms. ?Are comfortable and fit you well. ?Are closed at the toe. Do not wear sandals. ?If you use a stepladder: ?Make sure that it is fully opened. Do not climb a closed stepladder. ?Make sure that both sides of the stepladder are locked into place. ?Ask someone to hold it for you, if possible. ?Clearly mark and make sure that you can see: ?Any grab bars or  handrails. ?First and last steps. ?Where the edge of each step is. ?Use tools that help you move around (mobility aids) if they are needed. These include: ?Canes. ?Walkers. ?Scooters. ?Crutches. ?Turn on the lights when

## 2021-07-17 NOTE — Progress Notes (Signed)
?I connected with Andria Rhein today by telephone and verified that I am speaking with the correct person using two identifiers. ?Location patient: home ?Location provider: work ?Persons participating in the virtual visit: Pattiann Solanki, Glenna Durand LPN. ?  ?I discussed the limitations, risks, security and privacy concerns of performing an evaluation and management service by telephone and the availability of in person appointments. I also discussed with the patient that there may be a patient responsible charge related to this service. The patient expressed understanding and verbally consented to this telephonic visit.  ?  ?Interactive audio and video telecommunications were attempted between this provider and patient, however failed, due to patient having technical difficulties OR patient did not have access to video capability.  We continued and completed visit with audio only. ? ?  ? ?Vital signs may be patient reported or missing. ? ?Subjective:  ? Jenascia Bumpass is a 71 y.o. female who presents for Medicare Annual (Subsequent) preventive examination. ? ?Review of Systems    ? ?Cardiac Risk Factors include: advanced age (>10mn, >>34women);hypertension ? ?   ?Objective:  ?  ?Today's Vitals  ? 07/17/21 1331  ?Weight: 180 lb (81.6 kg)  ?Height: '5\' 7"'$  (1.702 m)  ? ?Body mass index is 28.19 kg/m?. ? ? ?  07/17/2021  ?  1:37 PM 07/06/2020  ?  3:28 PM 08/30/2017  ?  9:31 AM 08/16/2016  ?  8:31 AM 02/12/2014  ?  2:45 PM  ?Advanced Directives  ?Does Patient Have a Medical Advance Directive? No Yes Yes Yes Yes  ?Type of Advance Directive  Healthcare Power of Attorney   Living will  ?Does patient want to make changes to medical advance directive?     No - Patient declined  ?Copy of [place]?  No - copy requested     ? ? ?Current Medications (verified) ?Outpatient Encounter Medications as of 07/17/2021  ?Medication Sig  ? Calcium Carbonate-Vitamin D 600-200 MG-UNIT TABS Take by mouth.  ?  ELDERBERRY PO Take 1 tablet by mouth daily.  ? losartan (COZAAR) 50 MG tablet Take 1 tablet (50 mg total) by mouth daily.  ? Multiple Vitamins-Minerals (MULTIVITAMIN WITH MINERALS) tablet Take by mouth.  ? TURMERIC PO Take by mouth daily.  ? vitamin B-12 (CYANOCOBALAMIN) 1000 MCG tablet Take by mouth.  ? diclofenac Sodium (VOLTAREN) 1 % GEL Apply 4 g topically 4 (four) times daily. (Patient not taking: Reported on 07/17/2021)  ? ?No facility-administered encounter medications on file as of 07/17/2021.  ? ? ?Allergies (verified) ?Sulfa antibiotics  ? ?History: ?Past Medical History:  ?Diagnosis Date  ? History of DVT of lower extremity 2007,2009  ? left  ? Hypertension   ? Skin cancer   ? skin cancers removed  ? ?Past Surgical History:  ?Procedure Laterality Date  ? ABDOMINAL HYSTERECTOMY  2007  ? BREAST LUMPECTOMY WITH RADIOACTIVE SEED LOCALIZATION Right 02/18/2014  ? Procedure: RIGHT BREAST DUCT EXCISION AND RADIOACTIVE SEED LOCALIZED BREAST LUMPECTOMY;  Surgeon: TJackolyn Confer MD;  Location: [facility]  Service: General;  Laterality: Right;  ? BUNIONECTOMY    ? left  ? DILATION AND CURETTAGE OF UTERUS    ? DVT  2009  ? thrombectomy lt ll  ? right shoulder  2010  ? RCR-shoulder  ? THROMBECTOMY  2009  ? left common iliac vein-stent  ? TUBAL LIGATION    ? ?Family History  ?Problem Relation Age of Onset  ? Diabetes Mother   ?  Cancer Mother   ?     multiple myloma  ? Heart disease Father   ? Diabetes Brother   ? Diabetes Brother   ? Diabetes Brother   ? Diabetes Brother   ? Diabetes Brother   ? ?Social History  ? ?Socioeconomic History  ? Marital status: Divorced  ?  Spouse name: Not on file  ? Number of children: Not on file  ? Years of education: Not on file  ? Highest education level: Not on file  ?Occupational History  ? Not on file  ?Tobacco Use  ? Smoking status: Never  ? Smokeless tobacco: Never  ?Vaping Use  ? Vaping Use: Never used  ?Substance and Sexual Activity  ? Alcohol use: Yes  ?  Comment:  rarely  ? Drug use: No  ? Sexual activity: Yes  ?  Birth control/protection: Surgical  ?Other Topics Concern  ? Not on file  ?Social History Narrative  ? Not on file  ? ?Social Determinants of Health  ? ?Financial Resource Strain: Low Risk   ? Difficulty of Paying Living Expenses: Not hard at all  ?Food Insecurity: No Food Insecurity  ? Worried About Charity fundraiser in the Last Year: Never true  ? Ran Out of Food in the Last Year: Never true  ?Transportation Needs: No Transportation Needs  ? Lack of Transportation (Medical): No  ? Lack of Transportation (Non-Medical): No  ?Physical Activity: Insufficiently Active  ? Days of Exercise per Week: 4 days  ? Minutes of Exercise per Session: 20 min  ?Stress: No Stress Concern Present  ? Feeling of Stress : Only a little  ?Social Connections: Not on file  ? ? ?Tobacco Counseling ?Counseling given: Not Answered ? ? ?Clinical Intake: ? ?Pre-visit preparation completed: Yes ? ?Pain : No/denies pain ? ?  ? ?Diabetes: No ? ?How often do you need to have someone help you when you read instructions, pamphlets, or other written materials from your doctor or pharmacy?: 1 - Never ?What is the last grade level you completed in school?: some college ? ?Diabetic? no ? ?Interpreter Needed?: No ? ?Information entered by :: NAllen LPN ? ? ?Activities of Daily Living ? ?  07/17/2021  ?  1:39 PM  ?In your present state of health, do you have any difficulty performing the following activities:  ?Hearing? 0  ?Vision? 1  ?Comment has cataracts  ?Difficulty concentrating or making decisions? 0  ?Walking or climbing stairs? 0  ?Dressing or bathing? 0  ?Doing errands, shopping? 0  ?Preparing Food and eating ? N  ?Using the Toilet? N  ?In the past six months, have you accidently leaked urine? N  ?Do you have problems with loss of bowel control? N  ?Managing your Medications? N  ?Managing your Finances? N  ?Housekeeping or managing your Housekeeping? N  ? ? ?Patient Care Team: ?Martin[place], Betty G, MD  as PCP - General (Family Medicine) ? ?Indicate any recent Medical Services you may have received from other than [facility] providers in the past year (date may be approximate). ? ?   ?Assessment:  ? This is a routine wellness examination for [place]. ? ?Hearing/Vision screen ?Vision Screening - Comments:: Regular eye exams, [facility] ? ?Dietary issues and exercise activities discussed: ?Current Exercise Habits: Home exercise routine, Type of exercise: walking, Time (Minutes): 15, Frequency (Times/Week): 4, Weekly Exercise (Minutes/Week): 60 ? ? Goals Addressed   ? ?  ?  ?  ?  ? This Visit's Progress  ?  Patient Stated     ?  07/17/2021, want to get cataract surgery completed and wants to start walking 7 days a week ?  ? ?  ? ?Depression Screen ? ?  07/17/2021  ?  1:38 PM 01/16/2021  ?  1:19 PM 07/06/2020  ?  3:25 PM 09/05/2018  ?  8:26 AM 08/30/2017  ?  9:39 AM 08/16/2016  ?  8:39 AM 08/16/2016  ?  8:38 AM  ?PHQ 2/9 Scores  ?PHQ - 2 Score 0 0 1 0 0 0 0  ?  ?Fall Risk ? ?  07/17/2021  ?  1:38 PM 07/17/2021  ? 11:24 AM 07/06/2020  ?  3:29 PM 09/05/2018  ?  8:25 AM 08/30/2017  ?  9:39 AM  ?Fall Risk   ?Falls in the past year? 0 0 0 0 No  ?Number falls in past yr: 0  0 0   ?Injury with Fall? 0  0 0   ?Risk for fall due to : Medication side effect  Impaired vision;Impaired balance/gait    ?Follow up Falls evaluation completed;Education provided;Falls prevention discussed  Falls prevention discussed Education provided   ? ? ?FALL RISK PREVENTION PERTAINING TO THE HOME: ? ?Any stairs in or around the home? Yes  ?If so, are there any without handrails? No  ?Home free of loose throw rugs in walkways, pet beds, electrical cords, etc? Yes  ?Adequate lighting in your home to reduce risk of falls? Yes  ? ?ASSISTIVE DEVICES UTILIZED TO PREVENT FALLS: ? ?Life alert? No  ?Use of a cane, walker or w/c? No  ?Grab bars in the bathroom? No  ?Shower chair or bench in shower? No  ?Elevated toilet seat or a handicapped toilet? No  ? ?TIMED UP AND GO: ? ?Was  the test performed? No .  ?  ? ? ? ?Cognitive Function: ?  ?  ? ?  07/17/2021  ?  1:40 PM 07/06/2020  ?  3:31 PM  ?6CIT Screen  ?What Year? 0 points 0 points  ?What month? 0 points 0 points  ?What time? 0

## 2021-08-02 DIAGNOSIS — Z01818 Encounter for other preprocedural examination: Secondary | ICD-10-CM | POA: Diagnosis not present

## 2021-08-02 DIAGNOSIS — H2512 Age-related nuclear cataract, left eye: Secondary | ICD-10-CM | POA: Diagnosis not present

## 2021-08-02 DIAGNOSIS — H2511 Age-related nuclear cataract, right eye: Secondary | ICD-10-CM | POA: Diagnosis not present

## 2021-08-25 DIAGNOSIS — H2512 Age-related nuclear cataract, left eye: Secondary | ICD-10-CM | POA: Diagnosis not present

## 2021-08-31 ENCOUNTER — Ambulatory Visit
Admission: RE | Admit: 2021-08-31 | Discharge: 2021-08-31 | Disposition: A | Discharge status: Home / Self Care | Payer: PPO | Source: Ambulatory Visit | Attending: Family Medicine | Admitting: Family Medicine

## 2021-08-31 DIAGNOSIS — Z1231 Encounter for screening mammogram for malignant neoplasm of breast: Secondary | ICD-10-CM

## 2021-09-06 ENCOUNTER — Other Ambulatory Visit: Payer: Self-pay

## 2021-09-06 DIAGNOSIS — N644 Mastodynia: Secondary | ICD-10-CM

## 2021-09-07 ENCOUNTER — Other Ambulatory Visit: Payer: Self-pay | Admitting: Family Medicine

## 2021-09-07 DIAGNOSIS — N6310 Unspecified lump in the right breast, unspecified quadrant: Secondary | ICD-10-CM

## 2021-09-07 DIAGNOSIS — N644 Mastodynia: Secondary | ICD-10-CM

## 2021-09-08 ENCOUNTER — Telehealth: Payer: Self-pay | Admitting: Family Medicine

## 2021-09-08 DIAGNOSIS — H2511 Age-related nuclear cataract, right eye: Secondary | ICD-10-CM | POA: Diagnosis not present

## 2021-09-08 NOTE — Telephone Encounter (Signed)
Electronic orders signed. Thanks, BJ

## 2021-09-08 NOTE — Telephone Encounter (Signed)
Breast center called because they need Dr.Jordan to sign the e-signature on the mammogram and ultrasound order in epic. Patient appointment is Tuesday at 2:30. Did advise that provider was out of office today       Please advise

## 2021-09-12 ENCOUNTER — Ambulatory Visit
Admission: RE | Admit: 2021-09-12 | Discharge: 2021-09-12 | Disposition: A | Discharge status: Home / Self Care | Payer: PPO | Source: Ambulatory Visit | Attending: Family Medicine | Admitting: Family Medicine

## 2021-09-12 ENCOUNTER — Other Ambulatory Visit: Payer: Self-pay | Admitting: Family Medicine

## 2021-09-12 DIAGNOSIS — N6310 Unspecified lump in the right breast, unspecified quadrant: Secondary | ICD-10-CM

## 2021-09-12 DIAGNOSIS — N644 Mastodynia: Secondary | ICD-10-CM | POA: Diagnosis not present

## 2021-11-02 DIAGNOSIS — H2511 Age-related nuclear cataract, right eye: Secondary | ICD-10-CM | POA: Diagnosis not present

## 2021-11-23 DIAGNOSIS — J00 Acute nasopharyngitis [common cold]: Secondary | ICD-10-CM | POA: Diagnosis not present

## 2021-11-23 DIAGNOSIS — Z03818 Encounter for observation for suspected exposure to other biological agents ruled out: Secondary | ICD-10-CM | POA: Diagnosis not present

## 2021-12-04 NOTE — Progress Notes (Unsigned)
Chief Complaint  Patient presents with   Follow-up   HPI: [name] is a 71 y.o. female, who is here today to follow on recent urgent care visit. She was evaluated in acute care on 11/23/21 for sinus congestion. She had fever of 101.0-102.0 F. Negative for sick contacts or recent travel.  Appetite is back to normal (she did not eat for 3 days) and symptoms have resolved but she has not felt "quite right." States that she is no longer "craving sweets" and she is now "craving protein." Feeling "shaky inside",unsteady and fatigue.  -Night sweats for the past 6 months, no associated palpitations or abnormal weight loss. She has not identified exacerbating or alleviating symptoms.  -She has been "down" since 07/2020 and worse for 2 months. Lack of motivation. 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 She is attending church for the past year.     12/05/2021    3:56 PM 07/17/2021    1:38 PM 01/16/2021    1:19 PM 07/06/2020    3:25 PM 09/05/2018    8:26 AM  Depression screen PHQ 2/9  Decreased Interest 3 0 0 0 0  Down, Depressed, Hopeless 3 0 0 1 0  PHQ - 2 Score 6 0 0 1 0  Altered sleeping 3      Tired, decreased energy 0      Change in appetite 0      Feeling bad or failure about yourself  1      Trouble concentrating 0      Moving slowly or fidgety/restless 0      Suicidal thoughts 0      PHQ-9 Score 10      Difficult doing work/chores Not difficult at all       -Hypertension:  Medications: Losartan 50 mg daily. BP readings at home: Not checking.  During recent acute care visit BP was 104/70 (11/23/2021). Side effects:None Negative for unusual or severe headache, visual changes, exertional chest pain, focal weakness, or worsening edema.  -Dyspnea, feeling "winded" when going up stairs for the past month or so, it does not happen all the time, no associated symptoms. Negative for orthopnea  and PND. Once in a while left interscapular pain.  Lab Results  Component Value Date   CREATININE 0.81 10/07/2020   BUN 18 10/07/2020   NA 142 10/07/2020   K 4.3 10/07/2020   CL 106 10/07/2020   CO2 30 10/07/2020   Review of Systems  Constitutional:  Positive for fatigue. Negative for activity change and fever.  HENT:  Negative for mouth sores, nosebleeds and sore throat.   Respiratory:  Negative for cough and wheezing.   Gastrointestinal:  Negative for abdominal pain, nausea and vomiting.       Negative for changes in bowel habits.  Endocrine: Negative for cold intolerance and heat intolerance.  Genitourinary:  Negative for decreased urine volume, dysuria and hematuria.  Skin:  Negative for rash.  Neurological:  Negative for syncope and facial asymmetry.  Rest see pertinent positives and negatives per HPI.  Current Outpatient Medications on File Prior to Visit  Medication Sig Dispense Refill   Calcium Carbonate-Vitamin D 600-200 MG-UNIT TABS Take by mouth.     diclofenac Sodium (VOLTAREN) 1 % GEL Apply 4 g topically 4 (four) times daily. 150 g 2   ELDERBERRY PO Take 1 tablet by mouth daily.     losartan (  COZAAR) 50 MG tablet Take 1 tablet (50 mg total) by mouth daily. 90 tablet 2   Multiple Vitamins-Minerals (MULTIVITAMIN WITH MINERALS) tablet Take by mouth.     TURMERIC PO Take by mouth daily.     vitamin B-12 (CYANOCOBALAMIN) 1000 MCG tablet Take by mouth.     No current facility-administered medications on file prior to visit.   Past Medical History:  Diagnosis Date   History of DVT of lower extremity 2007,2009   left   Hypertension    Skin cancer    skin cancers removed   Allergies  Allergen Reactions   Sulfa Antibiotics Rash   Social History   Socioeconomic History   Marital status: Divorced    Spouse name: Not on file   Number of children: Not on file   Years of education: Not on file   Highest education level: Not on file  Occupational History   Not on  file  Tobacco Use   Smoking status: Never   Smokeless tobacco: Never  Vaping Use   Vaping Use: Never used  Substance and Sexual Activity   Alcohol use: Yes    Comment: rarely   Drug use: No   Sexual activity: Yes    Birth control/protection: Surgical  Other Topics Concern   Not on file  Social History Narrative   Not on file   Social Determinants of Health   Financial Resource Strain: Low Risk  (07/17/2021)   Overall Financial Resource Strain (CARDIA)    Difficulty of Paying Living Expenses: Not hard at all  Food Insecurity: No Food Insecurity (07/17/2021)   Hunger Vital Sign    Worried About Running Out of Food in the Last Year: Never true    [place] in the Last Year: Never true  Transportation Needs: No Transportation Needs (07/17/2021)   PRAPARE - Hydrologist (Medical): No    Lack of Transportation (Non-Medical): No  Physical Activity: Insufficiently Active (07/17/2021)   Exercise Vital Sign    Days of Exercise per Week: 4 days    Minutes of Exercise per Session: 20 min  Stress: No Stress Concern Present (07/17/2021)   [place]    Feeling of Stress : Only a little  Social Connections: Moderately Isolated (07/06/2020)   Social Connection and Isolation Panel [NHANES]    Frequency of Communication with Friends and Family: More than three times a week    Frequency of Social Gatherings with Friends and Family: Twice a week    Attends Religious Services: More than 4 times per year    Active Member of Clubs or Organizations: No    Attends Archivist Meetings: Never    Marital Status: Divorced   Vitals:   12/05/21 1547  BP: 128/80  Pulse: 75  Resp: 12  Temp: 98.9 F (37.2 C)  SpO2: 97%   Wt Readings from Last 3 Encounters:  12/05/21 178 lb (80.7 kg)  07/17/21 180 lb (81.6 kg)  01/16/21 175 lb 2 oz (79.4 kg)  Body mass index is 27.88 kg/m.  Physical  Exam Vitals and nursing note reviewed.  Constitutional:      General: She is not in acute distress.    Appearance: She is well-developed.  HENT:     Head: Normocephalic and atraumatic.     Mouth/Throat:     Mouth: Mucous membranes are moist.     Pharynx: Oropharynx is clear.  Eyes:  Conjunctiva/sclera: Conjunctivae normal.  Cardiovascular:     Rate and Rhythm: Normal rate and regular rhythm.     Pulses:          Dorsalis pedis pulses are 2+ on the right side and 2+ on the left side.     Heart sounds: No murmur heard.    Comments: Trace pitting LE edema right, 1+ LLE edema. Varicose veins LE, bilateral. Pulmonary:     Effort: Pulmonary effort is normal. No respiratory distress.     Breath sounds: Normal breath sounds.  Abdominal:     Palpations: Abdomen is soft. There is no mass.     Tenderness: There is no abdominal tenderness.  Musculoskeletal:     Right lower leg: Edema present.     Left lower leg: Edema present.  Lymphadenopathy:     Cervical: No cervical adenopathy.  Skin:    General: Skin is warm.     Findings: No erythema or rash.  Neurological:     General: No focal deficit present.     Mental Status: She is alert and oriented to person, place, and time.     Cranial Nerves: No cranial nerve deficit.     Gait: Gait normal.  Psychiatric:     Comments: Well groomed, good eye contact.   ASSESSMENT AND PLAN:  [name] was seen today for follow-up.  Diagnoses and all orders for this visit: Orders Placed This Encounter  Procedures   CBC   Comprehensive metabolic panel   TSH   Hepatitis C antibody   EKG 12-Lead   Lab Results  Component Value Date   WBC 5.1 12/06/2021   HGB 13.5 12/06/2021   HCT 40.1 12/06/2021   MCV 94.8 12/06/2021   PLT 237.0 12/06/2021   Lab Results  Component Value Date   CREATININE 0.78 12/06/2021   BUN 16 12/06/2021   NA 145 12/06/2021   K 4.3 12/06/2021   CL 108 12/06/2021   CO2 29 12/06/2021   Lab Results  Component  Value Date   ALT 20 12/06/2021   AST 18 12/06/2021   ALKPHOS 59 12/06/2021   BILITOT 0.9 12/06/2021   Lab Results  Component Value Date   TSH 1.71 12/06/2021   DOE (dyspnea on exertion) We discussed possible etiologies, including cardiac. For now she is not interested in referral to cardiologist. EKG today SR, normal axis, voltage criteria for LVH (aVL), and ? IVCD.  No significant difference when compared with EKG done in 02/2014. Clearly instructed about warning signs.  Night sweats No other associated symptoms. We discussed possible causes. History and examination today do not suggest a serious process. We will obtain CBC and TSH. Monitor for new symptoms.  Encounter for HCV screening test for low risk patient -     Hepatitis C antibody; Future  Essential hypertension, benign BP adequately controlled today. Recommend monitoring BP regularly. For now continue Losartan 50 mg daily and low salt diet.  Depression, major, single episode, mild ([place]) This problem could be contributing to some of her symptoms. After discussing some SSRI side effects, she agrees with trying Celexa 10 mg daily. Instructed about warning signs. Follow-up in 2 months, before if needed.  I spent a total of 42 minutes in both face to face and non face to face activities for this visit on the date of this encounter, excluding time for EKG. During this time history was obtained and documented, examination was performed, prior labs reviewed, and assessment/plan discussed. Lab is not available at  this time, she will come back for blood work later this week.  Return in about 2 months (around 02/04/2022).  Ferdinand Revoir G. [place], MD  [facility]. [place] office.

## 2021-12-05 ENCOUNTER — Ambulatory Visit (INDEPENDENT_AMBULATORY_CARE_PROVIDER_SITE_OTHER): Payer: PPO | Admitting: Family Medicine

## 2021-12-05 ENCOUNTER — Encounter: Payer: Self-pay | Admitting: Family Medicine

## 2021-12-05 VITALS — BP 128/80 | HR 75 | Temp 98.9°F | Resp 12 | Ht 67.0 in | Wt 178.0 lb

## 2021-12-05 DIAGNOSIS — R61 Generalized hyperhidrosis: Secondary | ICD-10-CM | POA: Diagnosis not present

## 2021-12-05 DIAGNOSIS — R0609 Other forms of dyspnea: Secondary | ICD-10-CM | POA: Diagnosis not present

## 2021-12-05 DIAGNOSIS — Z1159 Encounter for screening for other viral diseases: Secondary | ICD-10-CM | POA: Diagnosis not present

## 2021-12-05 DIAGNOSIS — F320 Major depressive disorder, single episode, mild: Secondary | ICD-10-CM

## 2021-12-05 DIAGNOSIS — I10 Essential (primary) hypertension: Secondary | ICD-10-CM

## 2021-12-05 DIAGNOSIS — F3341 Major depressive disorder, recurrent, in partial remission: Secondary | ICD-10-CM | POA: Insufficient documentation

## 2021-12-05 MED ORDER — CITALOPRAM HYDROBROMIDE 10 MG PO TABS: 10.0000 mg | ORAL_TABLET | Freq: Every day | ORAL | 1 refills | Status: DC

## 2021-12-05 NOTE — Patient Instructions (Addendum)
A few things to remember from today's visit:   Night sweats - Plan: CBC, TSH  DOE (dyspnea on exertion) - Plan: EKG 12-Lead  Essential hypertension, benign - Plan: Comprehensive metabolic panel  Depression, major, single episode, mild (HCC) - Plan: citalopram (CELEXA) 10 MG tablet  If you need refills please call your pharmacy. Do not use My Chart to request refills or for acute issues that need immediate attention.   Today we started Celexa 10 mg, this type of medications can increase suicidal risk. This is more prevalent among children,adolecents, and young adults with major depression or other psychiatric disorders. It can also make depression worse. Most common side effects are gastrointestinal, self limited after a few weeks: diarrhea, nausea, constipation  Or diarrhea among some.  In general it is well tolerated. We will follow closely.  Monitor BP regularly. Please be sure medication list is accurate. If a new problem present, please set up appointment sooner than planned today.

## 2021-12-05 NOTE — Assessment & Plan Note (Signed)
BP adequately controlled today. Recommend monitoring BP regularly. For now continue Losartan 50 mg daily and low salt diet.

## 2021-12-05 NOTE — Assessment & Plan Note (Signed)
This problem could be contributing to some of her symptoms. After discussing some SSRI side effects, she agrees with trying Celexa 10 mg daily. Instructed about warning signs. Follow-up in 2 months, before if needed.

## 2021-12-06 ENCOUNTER — Other Ambulatory Visit (INDEPENDENT_AMBULATORY_CARE_PROVIDER_SITE_OTHER): Payer: PPO

## 2021-12-06 DIAGNOSIS — Z1159 Encounter for screening for other viral diseases: Secondary | ICD-10-CM | POA: Diagnosis not present

## 2021-12-06 DIAGNOSIS — I10 Essential (primary) hypertension: Secondary | ICD-10-CM

## 2021-12-06 DIAGNOSIS — R61 Generalized hyperhidrosis: Secondary | ICD-10-CM | POA: Diagnosis not present

## 2021-12-06 LAB — COMPREHENSIVE METABOLIC PANEL
ALT: 20 U/L (ref 0–35)
AST: 18 U/L (ref 0–37)
Albumin: 3.8 g/dL (ref 3.5–5.2)
Alkaline Phosphatase: 59 U/L (ref 39–117)
BUN: 16 mg/dL (ref 6–23)
CO2: 29 mEq/L (ref 19–32)
Calcium: 8.9 mg/dL (ref 8.4–10.5)
Chloride: 108 mEq/L (ref 96–112)
Creatinine, Ser: 0.78 mg/dL (ref 0.40–1.20)
GFR: 76.18 mL/min (ref >60.00)
Glucose, Bld: 98 mg/dL (ref 70–99)
Potassium: 4.3 mEq/L (ref 3.5–5.1)
Sodium: 145 mEq/L (ref 135–145)
Total Bilirubin: 0.9 mg/dL (ref 0.2–1.2)
Total Protein: 6.3 g/dL (ref 6.0–8.3)

## 2021-12-06 LAB — CBC
HCT: 40.1 % (ref 36.0–46.0)
Hemoglobin: 13.5 g/dL (ref 12.0–15.0)
MCHC: 33.5 g/dL (ref 30.0–36.0)
MCV: 94.8 fl (ref 78.0–100.0)
Platelets: 237 10*3/uL (ref 150.0–400.0)
RBC: 4.23 Mil/uL (ref 3.87–5.11)
RDW: 12.8 % (ref 11.5–15.5)
WBC: 5.1 10*3/uL (ref 4.0–10.5)

## 2021-12-06 LAB — TSH: TSH: 1.71 u[IU]/mL (ref 0.35–5.50)

## 2021-12-07 LAB — HEPATITIS C ANTIBODY: Hepatitis C Ab: NONREACTIVE

## 2022-01-31 DIAGNOSIS — L570 Actinic keratosis: Secondary | ICD-10-CM | POA: Diagnosis not present

## 2022-02-06 NOTE — Progress Notes (Unsigned)
HPI: [name] is a 71 y.o. female, who is here today to follow on recent visit. She visited last on August 22nd-2023, when she was reporting lack of motivation and some mild depressive-like symptoms as well as dyspnea of exertion.  She reports resolution of breathing issues and an improvement in general attitude and mood after starting Celexa 10 mg.She experienced soft stools as a side effect, but this has since subsided.  She is sleeping an average of five to six hours per night and waking up three to four times to use the bathroom.  She drinks more of her fluids around dinner, which sometimes is late at night. This is a problem that has been going on for a while, otherwise stable. UA done in 09/2020 otherwise negative.  Negative for dysuria, hematuria, or foamy urine.  She is also complaining of " balance issue", which she attributes to left foot pain exacerbated by walking. She would like a referral to see a podiatrist.  She has a plantar corn and a callus on her left foot that she would like treated as well.  Negative for numbness, tingling, local edema, or erythema.  Hypertension on losartan 50 mg daily. Negative for severe/frequent headache, visual changes, chest pain, dyspnea, palpitation, claudication like symptoms, focal weakness, or edema. Lab Results  Component Value Date   CREATININE 0.78 12/06/2021   BUN 16 12/06/2021   NA 145 12/06/2021   K 4.3 12/06/2021   CL 108 12/06/2021   CO2 29 12/06/2021   Review of Systems  Constitutional:  Negative for activity change, appetite change and fever.  HENT:  Negative for mouth sores, nosebleeds and trouble swallowing.   Respiratory:  Negative for cough and wheezing.   Gastrointestinal:  Negative for abdominal pain, nausea and vomiting.  Genitourinary:  Negative for decreased urine volume and difficulty urinating.  Musculoskeletal:  Positive for arthralgias.  Neurological:  Negative for syncope and facial asymmetry.   Psychiatric/Behavioral:  Positive for sleep disturbance. Negative for confusion.   Rest see pertinent positives and negatives per HPI.  Current Outpatient Medications on File Prior to Visit  Medication Sig Dispense Refill   Calcium Carbonate-Vitamin D 600-200 MG-UNIT TABS Take by mouth.     diclofenac Sodium (VOLTAREN) 1 % GEL Apply 4 g topically 4 (four) times daily. 150 g 2   ELDERBERRY PO Take 1 tablet by mouth daily.     losartan (COZAAR) 50 MG tablet Take 1 tablet (50 mg total) by mouth daily. 90 tablet 2   Multiple Vitamins-Minerals (MULTIVITAMIN WITH MINERALS) tablet Take by mouth.     TURMERIC PO Take by mouth daily.     vitamin B-12 (CYANOCOBALAMIN) 1000 MCG tablet Take by mouth.     No current facility-administered medications on file prior to visit.   Past Medical History:  Diagnosis Date   History of DVT of lower extremity 2007,2009   left   Hypertension    Skin cancer    skin cancers removed   Allergies  Allergen Reactions   Sulfa Antibiotics Rash   Social History   Socioeconomic History   Marital status: Divorced    Spouse name: Not on file   Number of children: Not on file   Years of education: Not on file   Highest education level: Not on file  Occupational History   Not on file  Tobacco Use   Smoking status: Never   Smokeless tobacco: Never  Vaping Use   Vaping Use: Never used  Substance and  Sexual Activity   Alcohol use: Yes    Comment: rarely   Drug use: No   Sexual activity: Yes    Birth control/protection: Surgical  Other Topics Concern   Not on file  Social History Narrative   Not on file   Social Determinants of Health   Financial Resource Strain: Low Risk  (07/17/2021)   Overall Financial Resource Strain (CARDIA)    Difficulty of Paying Living Expenses: Not hard at all  Food Insecurity: No Food Insecurity (07/17/2021)   Hunger Vital Sign    Worried About Running Out of Food in the Last Year: Never true    Ran Out of Food in the Last  Year: Never true  Transportation Needs: No Transportation Needs (07/17/2021)   PRAPARE - Hydrologist (Medical): No    Lack of Transportation (Non-Medical): No  Physical Activity: Insufficiently Active (07/17/2021)   Exercise Vital Sign    Days of Exercise per Week: 4 days    Minutes of Exercise per Session: 20 min  Stress: No Stress Concern Present (07/17/2021)   [place]    Feeling of Stress : Only a little  Social Connections: Moderately Isolated (07/06/2020)   Social Connection and Isolation Panel [NHANES]    Frequency of Communication with Friends and Family: More than three times a week    Frequency of Social Gatherings with Friends and Family: Twice a week    Attends Religious Services: More than 4 times per year    Active Member of Clubs or Organizations: No    Attends Archivist Meetings: Never    Marital Status: Divorced   Vitals:   02/07/22 0834  BP: 124/80  Pulse: 73  Resp: 16  Temp: 98.3 F (36.8 C)  SpO2: 98%   Body mass index is 26.94 kg/m.  Physical Exam Vitals and nursing note reviewed.  Constitutional:      General: She is not in acute distress.    Appearance: She is well-developed.  HENT:     Head: Normocephalic and atraumatic.  Eyes:     Conjunctiva/sclera: Conjunctivae normal.  Cardiovascular:     Rate and Rhythm: Normal rate and regular rhythm.     Heart sounds: No murmur heard. Pulmonary:     Effort: Pulmonary effort is normal. No respiratory distress.     Breath sounds: Normal breath sounds.  Abdominal:     Palpations: Abdomen is soft. There is no hepatomegaly or mass.     Tenderness: There is no abdominal tenderness.  Musculoskeletal:     Right lower leg: No edema.     Left lower leg: No edema.       Feet:  Skin:    General: Skin is warm.     Findings: No erythema or rash.  Neurological:     General: No focal deficit present.      Mental Status: She is alert and oriented to person, place, and time.     Cranial Nerves: No cranial nerve deficit.     Gait: Gait normal.  Psychiatric:     Comments: Well groomed, good eye contact.   ASSESSMENT AND PLAN: [name] was seen today for follow-up.  Diagnoses and all orders for this visit: Orders Placed This Encounter  Procedures   Ambulatory referral to Podiatry   Left foot pain Consider wearing a comfortable and wider shoe. Pain can certainly increase the risk for falls. Appointment with podiatry will be  arranged as requested.  Nocturia No other associated symptoms. We discussed treatment options, including pharmacologic once.  She expresses a preference for reducing fluid intake before bedtime rather than starting medications for urinary frequency. Monitor for new symptoms.  Recurrent major depressive disorder, in partial remission,mild ([place]) Problem has improved. We reviewed side effects of Celexa, continue 10 mg daily. We will reevaluate in 6 months.  Essential hypertension, benign Adequately controlled. Continue losartan 50 mg daily and low-salt diet.  Return in about 6 months (around 08/09/2022).  Edder Bellanca G. [place], MD  [facility]. [place] office.

## 2022-02-07 ENCOUNTER — Encounter: Payer: Self-pay | Admitting: Family Medicine

## 2022-02-07 ENCOUNTER — Ambulatory Visit (INDEPENDENT_AMBULATORY_CARE_PROVIDER_SITE_OTHER): Payer: PPO | Admitting: Family Medicine

## 2022-02-07 VITALS — BP 124/80 | HR 73 | Temp 98.3°F | Resp 16 | Ht 67.0 in | Wt 172.0 lb

## 2022-02-07 DIAGNOSIS — M79672 Pain in left foot: Secondary | ICD-10-CM

## 2022-02-07 DIAGNOSIS — R351 Nocturia: Secondary | ICD-10-CM

## 2022-02-07 DIAGNOSIS — I10 Essential (primary) hypertension: Secondary | ICD-10-CM | POA: Diagnosis not present

## 2022-02-07 DIAGNOSIS — F3341 Major depressive disorder, recurrent, in partial remission: Secondary | ICD-10-CM | POA: Diagnosis not present

## 2022-02-07 MED ORDER — CITALOPRAM HYDROBROMIDE 10 MG PO TABS: 10.0000 mg | ORAL_TABLET | Freq: Every day | ORAL | 2 refills | Status: DC

## 2022-02-07 NOTE — Patient Instructions (Addendum)
A few things to remember from today's visit:  Left foot pain - Plan: Ambulatory referral to Podiatry  Nocturia  Depression, major, single episode, mild (HCC)  Essential hypertension, benign  No changes in celexa. Try to avoid fluids 3-4 hours before bedtime. Appt with podiatrist will be arranged.  If you need refills for medications you take chronically, please call your pharmacy. Do not use My Chart to request refills or for acute issues that need immediate attention. If you send a my chart message, it may take a few days to be addressed, specially if I am not in the office.  Please be sure medication list is accurate. If a new problem present, please set up appointment sooner than planned today.

## 2022-02-22 ENCOUNTER — Ambulatory Visit (INDEPENDENT_AMBULATORY_CARE_PROVIDER_SITE_OTHER): Payer: PPO

## 2022-02-22 ENCOUNTER — Encounter: Payer: Self-pay | Admitting: Podiatry

## 2022-02-22 ENCOUNTER — Ambulatory Visit (INDEPENDENT_AMBULATORY_CARE_PROVIDER_SITE_OTHER): Payer: PPO | Admitting: Podiatry

## 2022-02-22 DIAGNOSIS — D2372 Other benign neoplasm of skin of left lower limb, including hip: Secondary | ICD-10-CM | POA: Diagnosis not present

## 2022-02-22 DIAGNOSIS — M778 Other enthesopathies, not elsewhere classified: Secondary | ICD-10-CM | POA: Diagnosis not present

## 2022-02-25 NOTE — Progress Notes (Signed)
Subjective:  Patient ID: [name], female    DOB: Jan 25, 1951,  MRN: [id number] HPI Chief Complaint  Patient presents with   Foot Pain    Plantar forefoot left - calluses x 2 x years, had cut out one time and it last a long time, last doc she went to just scraped it and that didn't help too much, tried trimming at home, tender when they thicken   New Patient (Initial Visit)    71 y.o. female presents with the above complaint.   ROS: Denies fever chills nausea vomit muscle aches pains calf pain back pain chest pain shortness of breath.  Past Medical History:  Diagnosis Date   History of DVT of lower extremity 2007,2009   left   Hypertension    Skin cancer    skin cancers removed   Past Surgical History:  Procedure Laterality Date   ABDOMINAL HYSTERECTOMY  2007   BREAST LUMPECTOMY WITH RADIOACTIVE SEED LOCALIZATION Right 02/18/2014   Procedure: RIGHT BREAST DUCT EXCISION AND RADIOACTIVE SEED LOCALIZED BREAST LUMPECTOMY;  Surgeon: Jackolyn Confer, MD;  Location: [facility];  Service: General;  Laterality: Right;   BUNIONECTOMY     left   DILATION AND CURETTAGE OF UTERUS     DVT  2009   thrombectomy lt ll   right shoulder  2010   RCR-shoulder   THROMBECTOMY  2009   left common iliac vein-stent   TUBAL LIGATION      Current Outpatient Medications:    Calcium Carbonate-Vitamin D 600-200 MG-UNIT TABS, Take by mouth., Disp: , Rfl:    citalopram (CELEXA) 10 MG tablet, Take 1 tablet (10 mg total) by mouth daily., Disp: 90 tablet, Rfl: 2   diclofenac Sodium (VOLTAREN) 1 % GEL, Apply 4 g topically 4 (four) times daily., Disp: 150 g, Rfl: 2   ELDERBERRY PO, Take 1 tablet by mouth daily., Disp: , Rfl:    losartan (COZAAR) 50 MG tablet, Take 1 tablet (50 mg total) by mouth daily., Disp: 90 tablet, Rfl: 2   Multiple Vitamins-Minerals (MULTIVITAMIN WITH MINERALS) tablet, Take by mouth., Disp: , Rfl:    TURMERIC PO, Take by mouth daily., Disp: , Rfl:    vitamin  B-12 (CYANOCOBALAMIN) 1000 MCG tablet, Take by mouth., Disp: , Rfl:   Allergies  Allergen Reactions   Sulfa Antibiotics Rash   Review of Systems Objective:  There were no vitals filed for this visit.  General: Well developed, nourished, in no acute distress, alert and oriented x3   Dermatological: Skin is warm, dry and supple bilateral. Nails x 10 are well maintained; remaining integument appears unremarkable at this time. There are no open sores, no preulcerative lesions, no rash or signs of infection present.  Benign hyperkeratotic lesions plantar aspect of the foot consistent with poor keratomas.  Do not demonstrate verrucoid characteristics.  Do not demonstrate neoplastic characteristics.  Vascular: Dorsalis Pedis artery and Posterior Tibial artery pedal pulses are 2/4 bilateral with immedate capillary fill time. Pedal hair growth present. No varicosities and no lower extremity edema present bilateral.   Neruologic: Grossly intact via light touch bilateral. Vibratory intact via tuning fork bilateral. Protective threshold with Semmes Wienstein monofilament intact to all pedal sites bilateral. Patellar and Achilles deep tendon reflexes 2+ bilateral. No Babinski or clonus noted bilateral.   Musculoskeletal: No gross boney pedal deformities bilateral. No pain, crepitus, or limitation noted with foot and ankle range of motion bilateral. Muscular strength 5/5 in all groups tested bilateral.  Gait: Unassisted,  Nonantalgic.    Radiographs:  None taken  Assessment & Plan:   Assessment: Benign skin lesions left foot.  Plan: She would like to have these permanently taken care of but I explained to her in great detail today that this would never be permanently taking care of that they would most likely come back even if they were surgically removed.  She understands and is amenable to it we did debride them today mechanically.  We will also place Salinocaine under occlusion to be washed off  thoroughly in 3 days and not to get wet until that time.     Lanny Lipkin T. [place], [place]

## 2022-03-22 ENCOUNTER — Other Ambulatory Visit: Payer: PPO

## 2022-04-03 ENCOUNTER — Other Ambulatory Visit: Payer: Self-pay | Admitting: Family Medicine

## 2022-04-03 DIAGNOSIS — I10 Essential (primary) hypertension: Secondary | ICD-10-CM

## 2022-04-05 ENCOUNTER — Encounter: Payer: Self-pay | Admitting: Podiatry

## 2022-04-05 ENCOUNTER — Ambulatory Visit: Payer: PPO | Admitting: Podiatry

## 2022-04-05 DIAGNOSIS — D2372 Other benign neoplasm of skin of left lower limb, including hip: Secondary | ICD-10-CM | POA: Diagnosis not present

## 2022-04-05 NOTE — Progress Notes (Signed)
She presents today for follow-up of her painful benign skin lesions plantar aspect left foot.  States that they have felt so much better she is very happy that we have tried to do something to help relieve her symptomatology.  Objective: Vital signs stable alert oriented x 3 there is no erythema edema salines drainage or odor she does have reactive benign skin lesions subfirst and subfifth metatarsal heads bilateral left is the worst.  Assessment: Painful benign skin lesions left foot.  Plan: Debrided those lesions today and will follow-up with her in 3 months

## 2022-05-21 DIAGNOSIS — L820 Inflamed seborrheic keratosis: Secondary | ICD-10-CM | POA: Diagnosis not present

## 2022-05-21 DIAGNOSIS — L570 Actinic keratosis: Secondary | ICD-10-CM | POA: Diagnosis not present

## 2022-07-04 NOTE — Progress Notes (Unsigned)
ACUTE VISIT Chief Complaint  Patient presents with   Ear Fullness    Right ear x a few weeks, has tried ear drops.   HPI: [name] is a 72 y.o. female, who is here today complaining of right ear fullness in the right ear, which has persisted for a few weeks. She has attempted treatment with over-the-counter ear drops purchased from [facility], though she is uncertain of the specific product used. Since using the ear drops, she reports a new symptom of her ear "popping", which was not present before. Ear Fullness  There is pain in the right ear. This is a new problem. The current episode started 1 to 4 weeks ago. The problem occurs constantly. The problem has been unchanged. There has been no fever. The pain is at a severity of 0/10. The patient is experiencing no pain. Associated symptoms include hearing loss and rhinorrhea. Pertinent negatives include no abdominal pain, coughing, diarrhea, ear discharge, neck pain, rash, sore throat or vomiting. She has tried ear drops for the symptoms. The treatment provided no relief.  She denies any recent upper respiratory infections, travel to high altitudes, or airplane flights.   She had COVID in January/2024.  She denies experiencing any tinnitus but describes a sensation akin to white noise in the affected ear, accompanied by a decrease in hearing.  Reports "sinus problems", nasal congestion, which typically exacerbate around this time of the year. She denies symptoms of fever, chills, or sore throat, but notes that her left nostril has been running today.  Review of Systems  Constitutional:  Negative for activity change and appetite change.  HENT:  Positive for hearing loss and rhinorrhea. Negative for ear discharge and sore throat.   Respiratory:  Negative for cough and wheezing.   Gastrointestinal:  Negative for abdominal pain, diarrhea and vomiting.  Musculoskeletal:  Negative for neck pain.  Skin:  Negative for rash.   Allergic/Immunologic: Positive for environmental allergies.  Neurological:  Negative for syncope and weakness.  See other pertinent positives and negatives in HPI.  Current Outpatient Medications on File Prior to Visit  Medication Sig Dispense Refill   Calcium Carbonate-Vitamin D 600-200 MG-UNIT TABS Take by mouth.     citalopram (CELEXA) 10 MG tablet Take 1 tablet (10 mg total) by mouth daily. 90 tablet 2   diclofenac Sodium (VOLTAREN) 1 % GEL Apply 4 g topically 4 (four) times daily. 150 g 2   ELDERBERRY PO Take 1 tablet by mouth daily.     losartan (COZAAR) 50 MG tablet TAKE 1 TABLET BY MOUTH EVERY DAY 90 tablet 2   Multiple Vitamins-Minerals (MULTIVITAMIN WITH MINERALS) tablet Take by mouth.     TURMERIC PO Take by mouth daily.     vitamin B-12 (CYANOCOBALAMIN) 1000 MCG tablet Take by mouth.     No current facility-administered medications on file prior to visit.   Past Medical History:  Diagnosis Date   History of DVT of lower extremity 2007,2009   left   Hypertension    Skin cancer    skin cancers removed   Allergies  Allergen Reactions   Sulfa Antibiotics Rash    Social History   Socioeconomic History   Marital status: Divorced    Spouse name: Not on file   Number of children: Not on file   Years of education: Not on file   Highest education level: Not on file  Occupational History   Not on file  Tobacco Use   Smoking status: Never  Smokeless tobacco: Never  Vaping Use   Vaping Use: Never used  Substance and Sexual Activity   Alcohol use: Yes    Comment: rarely   Drug use: No   Sexual activity: Yes    Birth control/protection: Surgical  Other Topics Concern   Not on file  Social History Narrative   Not on file   Social Determinants of Health   Financial Resource Strain: Low Risk  (07/17/2021)   Overall Financial Resource Strain (CARDIA)    Difficulty of Paying Living Expenses: Not hard at all  Food Insecurity: No Food Insecurity (07/17/2021)    Hunger Vital Sign    Worried About Running Out of Food in the Last Year: Never true    Ran Out of Food in the Last Year: Never true  Transportation Needs: No Transportation Needs (07/17/2021)   PRAPARE - Hydrologist (Medical): No    Lack of Transportation (Non-Medical): No  Physical Activity: Insufficiently Active (07/17/2021)   Exercise Vital Sign    Days of Exercise per Week: 4 days    Minutes of Exercise per Session: 20 min  Stress: No Stress Concern Present (07/17/2021)   [place]    Feeling of Stress : Only a little  Social Connections: Moderately Isolated (07/06/2020)   Social Connection and Isolation Panel [NHANES]    Frequency of Communication with Friends and Family: More than three times a week    Frequency of Social Gatherings with Friends and Family: Twice a week    Attends Religious Services: More than 4 times per year    Active Member of Clubs or Organizations: No    Attends Archivist Meetings: Never    Marital Status: Divorced   Vitals:   07/06/22 1354  BP: 120/76  Pulse: 76  Resp: 16  Temp: 98.5 F (36.9 C)  SpO2: 97%   Body mass index is 27.41 kg/m.  Physical Exam Vitals and nursing note reviewed.  Constitutional:      General: She is not in acute distress.    Appearance: She is well-developed. She is not ill-appearing.  HENT:     Head: Normocephalic and atraumatic.     Right Ear: External ear normal.     Left Ear: Tympanic membrane and external ear normal.     Ears:     Comments: Cerumen excess left ear canal, able to see TM. Right ear canal with impacted cerumen.    Nose: Rhinorrhea present. No congestion.     Right Turbinates: Enlarged.  Eyes:     Conjunctiva/sclera: Conjunctivae normal.  Cardiovascular:     Rate and Rhythm: Normal rate and regular rhythm.     Heart sounds: No murmur heard. Pulmonary:     Effort: Pulmonary effort is normal. No  respiratory distress.     Breath sounds: Normal breath sounds. No stridor.  Lymphadenopathy:     Cervical: No cervical adenopathy.  Skin:    General: Skin is warm.     Findings: No erythema or rash.  Neurological:     Mental Status: She is alert and oriented to person, place, and time.  Psychiatric:        Mood and Affect: Mood and affect normal.   ASSESSMENT AND PLAN:   Hearing loss of right ear due to cerumen impaction Ear Cerumen Removal  Date/Time: 07/06/2022 2:27 PM  Performed by: [place], Markcus Lazenby G, MD Authorized by: [place], Dayna Geurts G, MD   Anesthesia:  Local Anesthetic: none Location details: right ear Patient tolerance: patient tolerated the procedure well with no immediate complications Comments: A good amount of cerumen removed with curette after trying ear irrigation x 1. Reports improvement of hearing. Procedure type: curette  Sedation: Patient sedated: no     Hearing Screening   500Hz  1000Hz  2000Hz  4000Hz   Right ear Pass Pass Pass Pass  Left ear Pass Pass Pass Pass   Allergic rhinitis due to pollen, unspecified seasonality *** -     Fluticasone Propionate; Place 1-2 sprays into both nostrils daily as needed for allergies or rhinitis.  Dispense: 16 g; Refill: 1   Return if symptoms worsen or fail to improve, for keep next appointment.  Holmes Hays G. Martinique, MD  [facility]. [place] office.

## 2022-07-05 ENCOUNTER — Ambulatory Visit: Payer: PPO | Admitting: Podiatry

## 2022-07-06 ENCOUNTER — Encounter: Payer: Self-pay | Admitting: Family Medicine

## 2022-07-06 ENCOUNTER — Ambulatory Visit (INDEPENDENT_AMBULATORY_CARE_PROVIDER_SITE_OTHER): Payer: PPO | Admitting: Family Medicine

## 2022-07-06 VITALS — BP 120/76 | HR 76 | Temp 98.5°F | Resp 16 | Ht 67.0 in | Wt 175.0 lb

## 2022-07-06 DIAGNOSIS — H6121 Impacted cerumen, right ear: Secondary | ICD-10-CM | POA: Diagnosis not present

## 2022-07-06 DIAGNOSIS — J301 Allergic rhinitis due to pollen: Secondary | ICD-10-CM

## 2022-07-06 MED ORDER — FLUTICASONE PROPIONATE 50 MCG/ACT NA SUSP: 1.0000 | Freq: Every day | NASAL | 1 refills | Status: DC | PRN

## 2022-07-06 NOTE — Patient Instructions (Signed)
A few things to remember from today's visit:  Hearing loss of right ear due to cerumen impaction  Allergic rhinitis due to pollen, unspecified seasonality - Plan: fluticasone (FLONASE) 50 MCG/ACT nasal spray  Flonase nasal spray daily for 10-14 days then as needed. Nasal saline irrigations as needed.  If you need refills for medications you take chronically, please call your pharmacy. Do not use My Chart to request refills or for acute issues that need immediate attention. If you send a my chart message, it may take a few days to be addressed, specially if I am not in the office.  Please be sure medication list is accurate. If a new problem present, please set up appointment sooner than planned today.

## 2022-07-11 ENCOUNTER — Telehealth: Payer: Self-pay | Admitting: Family Medicine

## 2022-07-11 NOTE — Telephone Encounter (Signed)
Contacted [name] to schedule their annual wellness visit. Appointment made for 07/24/22.  [facility] direct phone # [phone]   Spoke to patient to r/s her 4/8 awv appt to 4/9 Pt aware

## 2022-07-23 ENCOUNTER — Ambulatory Visit: Payer: Self-pay

## 2022-07-24 ENCOUNTER — Telehealth (INDEPENDENT_AMBULATORY_CARE_PROVIDER_SITE_OTHER): Payer: PPO | Admitting: Family Medicine

## 2022-07-24 ENCOUNTER — Encounter: Payer: Self-pay | Admitting: Family Medicine

## 2022-07-24 VITALS — Wt 170.0 lb

## 2022-07-24 DIAGNOSIS — Z0000 Encounter for general adult medical examination without abnormal findings: Secondary | ICD-10-CM

## 2022-07-24 NOTE — Patient Instructions (Signed)
I really enjoyed getting to talk with you today! I am available on Tuesdays and Thursdays for virtual visits if you have any questions or concerns, or if I can be of any further assistance.   CHECKLIST FROM ANNUAL WELLNESS VISIT:  -Follow up (please call to schedule if not scheduled after visit):   -yearly for annual wellness visit with primary care office  Here is a list of your preventive care/health maintenance measures and the plan for each if any are due:  PLAN For any measures below that may be due:  -can get any vaccines due at the pharmacy or can check with the office for the Tdap and pneumonia vaccines if you decide to do them.  Health Maintenance  Topic Date Due   DTaP/Tdap/Td (1 - Tdap) Never done   COVID-19 Vaccine (3 - 2023-24 season) 12/15/2021   Zoster Vaccines- Shingrix (1 of 2) 10/23/2022 (Originally 01/24/1970)   Pneumonia Vaccine 53+ Years old (1 of 1 - PCV) 07/24/2023 (Originally 01/25/2015)   Medicare Annual Wellness (AWV)  07/24/2023   MAMMOGRAM  09/13/2023   COLONOSCOPY (Pts 45-27yrs Insurance coverage will need to be confirmed)  10/30/2028   DEXA SCAN  Completed   Hepatitis C Screening  Completed   HPV VACCINES  Aged Out   INFLUENZA VACCINE  Discontinued    -See a dentist at least yearly  -Get your eyes checked and then per your eye specialist's recommendations  -Other issues addressed today:   -I have included below further information regarding a healthy whole foods based diet, physical activity guidelines for adults, stress management and opportunities for social connections. I hope you find this information useful.   -----------------------------------------------------------------------------------------------------------------------------------------------------------------------------------------------------------------------------------------------------------  NUTRITION: -eat real food: lots of colorful vegetables (half the plate) and  fruits -5-7 servings of vegetables and fruits per day (fresh or steamed is best), exp. 2 servings of vegetables with lunch and dinner and 2 servings of fruit per day. Berries and greens such as kale and collards are great choices.  -consume on a regular basis: whole grains (make sure first ingredient on label contains the word "whole"), fresh fruits, fish, nuts, seeds, healthy oils (such as olive oil, avocado oil, grape seed oil) -may eat small amounts of dairy and lean meat on occasion, but avoid processed meats such as ham, bacon, lunch meat, etc. -drink water -try to avoid fast food and pre-packaged foods, processed meat -most experts advise limiting sodium to < 2300mg  per day, should limit further is any chronic conditions such as high blood pressure, heart disease, diabetes, etc. The American Heart Association advised that < 1500mg  is is ideal -try to avoid foods that contain any ingredients with names you do not recognize  -try to avoid sugar/sweets (except for the natural sugar that occurs in fresh fruit) -try to avoid sweet drinks -try to avoid white rice, white bread, pasta (unless whole grain), white or yellow potatoes  EXERCISE GUIDELINES FOR ADULTS: -if you wish to increase your physical activity, do so gradually and with the approval of your doctor -STOP and seek medical care immediately if you have any chest pain, chest discomfort or trouble breathing when starting or increasing exercise  -move and stretch your body, legs, feet and arms when sitting for long periods -Physical activity guidelines for optimal health in adults: -least 150 minutes per week of aerobic exercise (can talk, but not sing) once approved by your doctor, 20-30 minutes of sustained activity or two 10 minute episodes of sustained activity every day.  -resistance  training at least 2 days per week if approved by your doctor -balance exercises 3+ days per week:   Stand somewhere where you have something sturdy to  hold onto if you lose balance.    1) lift up on toes, start with 5x per day and work up to 20x   2) stand and lift on leg straight out to the side so that foot is a few inches of the floor, start with 5x each side and work up to 20x each side   3) stand on one foot, start with 5 seconds each side and work up to 20 seconds on each side  If you need ideas or help with getting more active:  -Silver sneakers https://tools.silversneakers.com  -Walk with a Doc: http://www.duncan-williams.com/  -try to include resistance (weight lifting/strength building) and balance exercises twice per week: or the following link for ideas: http://castillo-powell.com/  BuyDucts.dk  STRESS MANAGEMENT: -can try meditating, or just sitting quietly with deep breathing while intentionally relaxing all parts of your body for 5 minutes daily -if you need further help with stress, anxiety or depression please follow up with your primary doctor or contact the wonderful folks at [facility]: [phone]  SOCIAL CONNECTIONS: -options in [place] if you wish to engage in more social and exercise related activities:  -Silver sneakers https://tools.silversneakers.com  -Walk with a Doc: http://www.duncan-williams.com/  -Check out the [facility] Active Adults 50+ section on the [place] [employer] (hiking clubs, book clubs, cards and games, chess, exercise classes, aquatic classes and much more) - see the website for details: https://www.[place].gov/departments/parks-recreation/active-adults50  -YouTube has lots of exercise videos for different ages and abilities as well  -[facility] (a variety of indoor and outdoor inperson activities for adults). ([phone]. [address]  -Virtual Online Classes (a variety of topics): see seniorplanet.org or call [phone]  -consider volunteering  at a school, hospice center, church, senior center or elsewhere         ADVANCED HEALTHCARE DIRECTIVES:  Everyone should have advanced health care directives in place. This is so that you get the care you want, should you ever be in a situation where you are unable to make your own medical decisions.   From the [place] Advanced Directive Website: "Advance Health Care Directives are legal documents in which you give written instructions about your health care if, in the future, you cannot speak for yourself.   A health care power of attorney allows you to name a person you trust to make your health care decisions if you cannot make them yourself. A declaration of a desire for a natural death (or living will) is document, which states that you desire not to have your life prolonged by extraordinary measures if you have a terminal or incurable illness or if you are in a vegetative state. An advance instruction for mental health treatment makes a declaration of instructions, information and preferences regarding your mental health treatment. It also states that you are aware that the advance instruction authorizes a mental health treatment provider to act according to your wishes. It may also outline your consent or refusal of mental health treatment. A declaration of an anatomical gift allows anyone over the age of 41 to make a gift by will, organ donor card or other document."   Please see the following website or an elder law attorney for forms, FAQs and for completion of advanced directives: [place] TEFL teacher Health Care Directives Advance Health Care Directives (http://guzman.com/)  Or copy and paste the following to your web browser: PokerReunion.com.cy

## 2022-07-24 NOTE — Progress Notes (Signed)
PATIENT CHECK-IN and HEALTH RISK ASSESSMENT QUESTIONNAIRE:  -completed by phone/video for upcoming Medicare Preventive Visit  Pre-Visit Check-in: 1)Vitals (height, wt, BP, etc) - record in vitals section for visit on day of visit 2)Review and Update Medications, Allergies PMH, Surgeries, Social history in Epic 3)Hospitalizations in the last year with date/reason? no 4)Review and Update Care Team (patient's specialists) in Epic 5) Complete PHQ9 in Epic  6) Complete Fall Screening in Epic 7)Review all Health Maintenance Due and order under PCP if not done.  8)Medicare Wellness Questionnaire: Answer theses question about your habits: Do you drink alcohol? yes If yes, how many drinks do you have a day?rarely  Have you ever smoked?no Quit date if applicable? none How many packs a day do/did you smoke? none Do you use smokeless tobacco? no Do you use an illicit drugs?no Do you exercises? Yes IF so, what type and how many days/minutes per week?2-3 days a week for 15 minutes - walking.  Are you sexually active? No Number of partners? 0 Feels like eats regular meals. Feels can do better.  Typical breakfast:  nothing regular -eggs Typical lunch: salad pizza Typical dinner: snack  Typical snacks: peanut butter or cheese crackers,chips and salsa  Beverages: flavored water, water  Answer theses question about you: Can you perform most household chores?yes Do you find it hard to follow a conversation in a noisy room? no Do you often ask people to speak up or repeat themselves? no Do you feel that you have a problem with memory? no Do you balance your checkbook and or bank acounts? yes Do you feel safe at home? yes Last dentist visit? 6 months Do you need assistance with any of the following: Please note if so no  Driving?  Feeding yourself?  Getting from bed to chair?  Getting to the toilet?  Bathing or showering?  Dressing yourself?  Managing money?  Climbing a flight of  stairs  Preparing meals?  Do you have Advanced Directives in place (Living Will, Healthcare Power or Attorney)? no   Last eye Exam and location? Just had surgery.  Can't remember the doctor's name.   Do you currently use prescribed or non-prescribed narcotic or opioid pain medications? no  Do you have a history or close family history of breast, ovarian, tubal or peritoneal cancer or a family member with BRCA (breast cancer susceptibility 1 and 2) gene mutations? no  Nurse/Assistant Credentials/time stamp:  Fleet Contras vereen cma ----------------------------------------------------------------------------------------------------------------------------------------------------------------------------------------------------------------------   MEDICARE ANNUAL PREVENTIVE VISIT WITH PROVIDER: (Welcome to Medicare, initial annual wellness or annual wellness exam)  Virtual Visit via Video Note  I connected with Delma Freeze Holsworth on 07/24/22 by a video enabled telemedicine application and verified that I am speaking with the correct person using two identifiers.  Location patient: home Location provider:work or home office Persons participating in the virtual visit: patient, provider  Concerns and/or follow up today: none   See HM section in Epic for other details of completed HM.    ROS: negative for report of fevers, unintentional weight loss, vision changes, vision loss, hearing loss or change, chest pain, sob, hemoptysis, melena, hematochezia, hematuria, falls, bleeding or bruising, thoughts of suicide or self harm, memory loss  Patient-completed extensive health risk assessment - reviewed and discussed with the patient: See Health Risk Assessment completed with patient prior to the visit either above or in recent phone note. This was reviewed in detailed with the patient today and appropriate recommendations, orders and referrals were placed as needed per  Summary below and patient  instructions.   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   Hypertension    Skin cancer    skin cancers removed    Past Surgical History:  Procedure Laterality Date   ABDOMINAL HYSTERECTOMY  2007   BREAST LUMPECTOMY WITH RADIOACTIVE SEED LOCALIZATION Right 02/18/2014   Procedure: RIGHT BREAST DUCT EXCISION AND RADIOACTIVE SEED LOCALIZED BREAST LUMPECTOMY;  Surgeon: Avel Peace, MD;  Location:  SURGERY CENTER;  Service: General;  Laterality: Right;   BUNIONECTOMY     left   DILATION AND CURETTAGE OF UTERUS     DVT  2009   thrombectomy lt ll   right shoulder  2010   RCR-shoulder   THROMBECTOMY  2009   left common iliac vein-stent   TUBAL LIGATION      Social History   Socioeconomic History   Marital status: Divorced    Spouse name: Not on file   Number of children: Not on file   Years of education: Not on file   Highest education level: Not on file  Occupational History   Not on file  Tobacco Use   Smoking status: Never   Smokeless tobacco: Never  Vaping Use   Vaping Use: Never used  Substance and Sexual Activity   Alcohol use: Yes    Comment: rarely   Drug use: No   Sexual activity: Yes    Birth control/protection: Surgical  Other Topics Concern   Not on file  Social History Narrative   Not on file   Social Determinants of Health   Financial Resource Strain: Low Risk  (07/17/2021)   Overall Financial Resource Strain (CARDIA)    Difficulty of Paying Living Expenses: Not hard at all  Food Insecurity: No Food Insecurity (07/17/2021)   Hunger Vital Sign    Worried About Running Out of Food in the Last Year: Never true    Ran  Out of Food in the Last Year: Never true  Transportation Needs: No Transportation Needs (07/17/2021)   PRAPARE - Administrator, Civil Service (Medical): No    Lack of Transportation (Non-Medical): No  Physical Activity: Insufficiently Active (07/17/2021)   Exercise Vital Sign    Days of Exercise per Week: 4 days    Minutes of Exercise per Session: 20 min  Stress: No Stress Concern Present (07/17/2021)   [employer] of Occupational Health - Occupational Stress Questionnaire    Feeling of Stress : Only a little  Social Connections: Moderately Isolated (07/06/2020)   Social Connection and Isolation Panel [NHANES]    Frequency of Communication with Friends and Family: More than three times a week    Frequency of Social Gatherings with Friends and Family: Twice a week    Attends Religious Services: More than 4 times per year    Active Member of [employer] or Organizations: No    Attends Banker Meetings: Never    Marital Status: Divorced  Catering manager Violence: Not At Risk (07/06/2020)   Humiliation, Afraid, Rape, and Kick questionnaire    Fear of Current or Ex-Partner: No    Emotionally Abused: No    Physically Abused: No  Sexually Abused: No    Family History  Problem Relation Age of Onset   Diabetes Mother    Cancer Mother        multiple myloma   Heart disease Father    Diabetes Brother    Diabetes Brother    Diabetes Brother    Diabetes Brother    Diabetes Brother     Current Outpatient Medications on File Prior to Visit  Medication Sig Dispense Refill   Calcium Carbonate-Vitamin D 600-200 MG-UNIT TABS Take by mouth.     citalopram (CELEXA) 10 MG tablet Take 1 tablet (10 mg total) by mouth daily. 90 tablet 2   diclofenac Sodium (VOLTAREN) 1 % GEL Apply 4 g topically 4 (four) times daily. 150 g 2   ELDERBERRY PO Take 1 tablet by mouth daily.     fluticasone (FLONASE) 50 MCG/ACT nasal spray Place 1-2 sprays into both nostrils daily as needed for  allergies or rhinitis. 16 g 1   losartan (COZAAR) 50 MG tablet TAKE 1 TABLET BY MOUTH EVERY DAY 90 tablet 2   Multiple Vitamins-Minerals (MULTIVITAMIN WITH MINERALS) tablet Take by mouth.     TURMERIC PO Take by mouth daily.     vitamin B-12 (CYANOCOBALAMIN) 1000 MCG tablet Take by mouth.     No current facility-administered medications on file prior to visit.    Allergies  Allergen Reactions   Sulfa Antibiotics Rash       Physical Exam There were no vitals filed for this visit. Estimated body mass index is 26.63 kg/m as calculated from the following:   Height as of 07/06/22: 5\' 7"  (1.702 m).   Weight as of this encounter: 170 lb (77.1 kg).  EKG (optional): deferred due to virtual visit  GENERAL: alert, oriented, no acute distress detected, full vision exam deferred due to pandemic and/or virtual encounter  HEENT: atraumatic, conjunttiva clear, no obvious abnormalities on inspection of external nose and ears  NECK: normal movements of the head and neck  LUNGS: on inspection no signs of respiratory distress, breathing rate appears normal, no obvious gross SOB, gasping or wheezing  CV: no obvious cyanosis  MS: moves all visible extremities without noticeable abnormality  PSYCH/NEURO: pleasant and cooperative, no obvious depression or anxiety, speech and thought processing grossly intact, Cognitive function grossly intact  Flowsheet Row Video Visit from 07/24/2022 in [facility]  PHQ-9 Total Score 1           07/24/2022    3:09 PM 07/06/2022    2:01 PM 02/07/2022    8:39 AM 12/05/2021    3:56 PM 07/17/2021    1:38 PM  Depression screen PHQ 2/9  Decreased Interest 0 0 0 3 0  Down, Depressed, Hopeless 0 0 0 3 0  PHQ - 2 Score 0 0 0 6 0  Altered sleeping 1 0 0 3   Tired, decreased energy 0 0 0 0   Change in appetite 0 0 0 0   Feeling bad or failure about yourself  0 1 1 1    Trouble concentrating 0 0 0 0   Moving slowly or fidgety/restless  0 0 0 0   Suicidal thoughts 0 0 0 0   PHQ-9 Score 1 1 1 10    Difficult doing work/chores Not difficult at all Somewhat difficult Somewhat difficult Not difficult at all        07/17/2021    1:38 PM 12/05/2021    3:57 PM 02/07/2022  8:39 AM 07/06/2022    2:01 PM 07/24/2022    3:09 PM  Fall Risk  Falls in the past year? 0 0 0 0 0  Was there an injury with Fall? 0 0 0 0 0  Fall Risk Category Calculator 0 0 0 0 0  Fall Risk Category (Retired) Low Low Low    (RETIRED) Patient Fall Risk Level Low fall risk Low fall risk Low fall risk    Patient at Risk for Falls Due to Medication side effect No Fall Risks No Fall Risks Other (Comment)   Fall risk Follow up Falls evaluation completed;Education provided;Falls prevention discussed Falls evaluation completed Falls evaluation completed Falls evaluation completed Falls evaluation completed     SUMMARY AND PLAN:  Encounter for Medicare annual wellness exam   Discussed applicable health maintenance/preventive health measures and advised and referred or ordered per patient preferences:  Health Maintenance  Topic Date Due   DTaP/Tdap/Td (1 - Tdap) Never done, discussed, she is considering, knows can get at office or at the pharmacy   COVID-19 Vaccine (3 - 2023-24 season) 12/15/2021, discussed, advised can get at pharmacy   Zoster Vaccines- Shingrix (1 of 2) 10/23/2022 (Originally 01/24/1970), advised can get at pharmacy   Pneumonia Vaccine 40+ Years old (1 of 1 - PCV) 07/24/2023 (Originally 01/25/2015), advised can get at pharmacy   Medicare Annual Wellness (AWV)  07/24/2023   MAMMOGRAM  09/13/2023   COLONOSCOPY (Pts 45-68yrs Insurance coverage will need to be confirmed)  10/30/2028   DEXA SCAN  Completed   Hepatitis C Screening  Completed   HPV VACCINES  Aged Out   INFLUENZA VACCINE  Discontinued    Education and counseling on the following was provided based on the above review of health and a plan/checklist for the patient, along with  additional information discussed, was provided for the patient in the patient instructions :  -Advised on importance of completing advanced directives, discussed options for completing and provided information in patient instructions as well  Reviewed and provided safe balance exercises that can be done at home to improve balance and discussed exercise guidelines for adults with include balance exercises at least 3 days per week.  -Advised and counseled on a healthy lifestyle - including the importance of a healthy diet, regular physical activity, social connections and stress management. -Reviewed patient's current diet. Advised and counseled on a whole foods based healthy diet. A summary of a healthy diet was provided in the Patient Instructions.  -reviewed patient's current physical activity level and discussed exercise guidelines for adults. Discussed community resources and ideas for safe exercise at home to assist in meeting exercise guideline recommendations in a safe and healthy way.  -Advise yearly dental visits at minimum and regular eye exams   Follow up: see patient instructions     Patient Instructions  I really enjoyed getting to talk with you today! I am available on Tuesdays and Thursdays for virtual visits if you have any questions or concerns, or if I can be of any further assistance.   CHECKLIST FROM ANNUAL WELLNESS VISIT:  -Follow up (please call to schedule if not scheduled after visit):   -yearly for annual wellness visit with primary care office  Here is a list of your preventive care/health maintenance measures and the plan for each if any are due:  PLAN For any measures below that may be due:  -can get any vaccines due at the pharmacy or can check with the office for the Tdap and  pneumonia vaccines if you decide to do them.  Health Maintenance  Topic Date Due   DTaP/Tdap/Td (1 - Tdap) Never done   COVID-19 Vaccine (3 - 2023-24 season) 12/15/2021   Zoster  Vaccines- Shingrix (1 of 2) 10/23/2022 (Originally 01/24/1970)   Pneumonia Vaccine 42+ Years old (1 of 1 - PCV) 07/24/2023 (Originally 01/25/2015)   Medicare Annual Wellness (AWV)  07/24/2023   MAMMOGRAM  09/13/2023   COLONOSCOPY (Pts 45-63yrs Insurance coverage will need to be confirmed)  10/30/2028   DEXA SCAN  Completed   Hepatitis C Screening  Completed   HPV VACCINES  Aged Out   INFLUENZA VACCINE  Discontinued    -See a dentist at least yearly  -Get your eyes checked and then per your eye specialist's recommendations  -Other issues addressed today:   -I have included below further information regarding a healthy whole foods based diet, physical activity guidelines for adults, stress management and opportunities for social connections. I hope you find this information useful.   -----------------------------------------------------------------------------------------------------------------------------------------------------------------------------------------------------------------------------------------------------------  NUTRITION: -eat real food: lots of colorful vegetables (half the plate) and fruits -5-7 servings of vegetables and fruits per day (fresh or steamed is best), exp. 2 servings of vegetables with lunch and dinner and 2 servings of fruit per day. Berries and greens such as kale and collards are great choices.  -consume on a regular basis: whole grains (make sure first ingredient on label contains the word "whole"), fresh fruits, fish, nuts, seeds, healthy oils (such as olive oil, avocado oil, grape seed oil) -may eat small amounts of dairy and lean meat on occasion, but avoid processed meats such as ham, bacon, lunch meat, etc. -drink water -try to avoid fast food and pre-packaged foods, processed meat -most experts advise limiting sodium to < 2300mg  per day, should limit further is any chronic conditions such as high blood pressure, heart disease, diabetes, etc.  The American Heart Association advised that < 1500mg  is is ideal -try to avoid foods that contain any ingredients with names you do not recognize  -try to avoid sugar/sweets (except for the natural sugar that occurs in fresh fruit) -try to avoid sweet drinks -try to avoid white rice, white bread, pasta (unless whole grain), white or yellow potatoes  EXERCISE GUIDELINES FOR ADULTS: -if you wish to increase your physical activity, do so gradually and with the approval of your doctor -STOP and seek medical care immediately if you have any chest pain, chest discomfort or trouble breathing when starting or increasing exercise  -move and stretch your body, legs, feet and arms when sitting for long periods -Physical activity guidelines for optimal health in adults: -least 150 minutes per week of aerobic exercise (can talk, but not sing) once approved by your doctor, 20-30 minutes of sustained activity or two 10 minute episodes of sustained activity every day.  -resistance training at least 2 days per week if approved by your doctor -balance exercises 3+ days per week:   Stand somewhere where you have something sturdy to hold onto if you lose balance.    1) lift up on toes, start with 5x per day and work up to 20x   2) stand and lift on leg straight out to the side so that foot is a few inches of the floor, start with 5x each side and work up to 20x each side   3) stand on one foot, start with 5 seconds each side and work up to 20 seconds on each side  If you need  ideas or help with getting more active:  -Silver sneakers https://tools.silversneakers.com  -Walk with a Doc: http://www.duncan-williams.com/Https://walkwithadoc.org  -try to include resistance (weight lifting/strength building) and balance exercises twice per week: or the following link for ideas: http://castillo-powell.com/https://www.healthline.com/health/exercise-fitness/balance-exercises-for-seniors  BuyDucts.dkhttps://www.seniorfitnesswithmeredith.com/about/  STRESS MANAGEMENT: -can try  meditating, or just sitting quietly with deep breathing while intentionally relaxing all parts of your body for 5 minutes daily -if you need further help with stress, anxiety or depression please follow up with your primary doctor or contact the wonderful folks at [facility]: [phone]  SOCIAL CONNECTIONS: -options in [place] if you wish to engage in more social and exercise related activities:  -Silver sneakers https://tools.silversneakers.com  -Walk with a Doc: http://www.duncan-williams.com/Https://walkwithadoc.org  -Check out the [facility] Active Adults 50+ section on the [place] website (hiking clubs, book clubs, cards and games, chess, exercise classes, aquatic classes and much more) - see the website for details: https://www.-[place].gov/departments/parks-recreation/active-adults50  -YouTube has lots of exercise videos for different ages and abilities as well  -[facility] (a variety of indoor and outdoor inperson activities for adults). [phone]. [address]  -Virtual Online Classes (a variety of topics): see seniorplanet.org or call ([phone])[phone]  -consider volunteering at a school, hospice center, church, senior center or elsewhere         ADVANCED HEALTHCARE DIRECTIVES:  Everyone should have advanced health care directives in place. This is so that you get the care you want, should you ever be in a situation where you are unable to make your own medical decisions.   From the [place] Advanced Directive Website: "Advance Health Care Directives are legal documents in which you give written instructions about your health care if, in the future, you cannot speak for yourself.   A health care power of attorney allows you to name a person you trust to make your health care decisions if you cannot make them yourself. A declaration of a desire for a natural death (or living will) is document, which states that you desire not to have your life  prolonged by extraordinary measures if you have a terminal or incurable illness or if you are in a vegetative state. An advance instruction for mental health treatment makes a declaration of instructions, information and preferences regarding your mental health treatment. It also states that you are aware that the advance instruction authorizes a mental health treatment provider to act according to your wishes. It may also outline your consent or refusal of mental health treatment. A declaration of an anatomical gift allows anyone over the age of 73 to make a gift by will, organ donor card or other document."   Please see the following website or an elder law attorney for forms, FAQs and for completion of advanced directives: [place] Secretary of State Advance Health Care Directives Advance Health Care Directives (http://guzman.com/sosnc.gov)  Or copy and paste the following to your web browser: PoshChat.fiHttps://wwwsosnc.gov/ahcdr    Terressa KoyanagiHannah R Steffanie Mingle, DO

## 2022-08-07 NOTE — Progress Notes (Unsigned)
HPI: [name] is a 72 y.o. female, who is here today for chronic disease management.  Last seen on 07/06/22 Hypertension: Currently she is on losartan 50 mg daily. She has tolerated medication well. BP readings at home are "fine", usually under 140/90. Negative for unusual or severe headache, visual changes, exertional chest pain, dyspnea,  focal weakness, or edema.  Lab Results  Component Value Date   CREATININE 0.78 12/06/2021   BUN 16 12/06/2021   NA 145 12/06/2021   K 4.3 12/06/2021   CL 108 12/06/2021   CO2 29 12/06/2021   Today she  complaints of soreness in her gums, one area, left lower for about a month and the back of her tongue for 2 days after bitten her tongue   No tobacco use. She describes a white area on the top of the affect gum around the tooth, but no bleeding. She states that she visits the dentist every six months. She has not tried OTC medications.  She also reports pain in her right hip, which she has experienced bilateral hip pain intermittently for a while.R>L and seems to be worse on rainy days.  No history of recent trauma.  Review of Systems  Constitutional:  Negative for chills and fever.  HENT:  Negative for sore throat and trouble swallowing.   Respiratory:  Negative for cough and wheezing.   Gastrointestinal:  Negative for abdominal pain, nausea and vomiting.  Genitourinary:  Negative for decreased urine volume, dysuria and hematuria.  Skin:  Negative for rash.  Neurological:  Negative for syncope and facial asymmetry.  See other pertinent positives and negatives in HPI.  Current Outpatient Medications on File Prior to Visit  Medication Sig Dispense Refill   Calcium Carbonate-Vitamin D 600-200 MG-UNIT TABS Take by mouth.     citalopram (CELEXA) 10 MG tablet Take 1 tablet (10 mg total) by mouth daily. 90 tablet 2   ELDERBERRY PO Take 1 tablet by mouth daily.     fluticasone (FLONASE) 50 MCG/ACT nasal spray Place 1-2 sprays into  both nostrils daily as needed for allergies or rhinitis. 16 g 1   losartan (COZAAR) 50 MG tablet TAKE 1 TABLET BY MOUTH EVERY DAY 90 tablet 2   Multiple Vitamins-Minerals (MULTIVITAMIN WITH MINERALS) tablet Take by mouth.     TURMERIC PO Take by mouth daily.     vitamin B-12 (CYANOCOBALAMIN) 1000 MCG tablet Take by mouth.     No current facility-administered medications on file prior to visit.   Past Medical History:  Diagnosis Date   History of DVT of lower extremity 2007,2009   left   Hypertension    Skin cancer    skin cancers removed   Allergies  Allergen Reactions   Sulfa Antibiotics Rash   Social History   Socioeconomic History   Marital status: Divorced    Spouse name: Not on file   Number of children: Not on file   Years of education: Not on file   Highest education level: Not on file  Occupational History   Not on file  Tobacco Use   Smoking status: Never   Smokeless tobacco: Never  Vaping Use   Vaping Use: Never used  Substance and Sexual Activity   Alcohol use: Yes    Comment: rarely   Drug use: No   Sexual activity: Yes    Birth control/protection: Surgical  Other Topics Concern   Not on file  Social History Narrative   Not on file  Social Determinants of Health   Financial Resource Strain: Low Risk  (07/17/2021)   Overall Financial Resource Strain (CARDIA)    Difficulty of Paying Living Expenses: Not hard at all  Food Insecurity: No Food Insecurity (07/17/2021)   Hunger Vital Sign    Worried About Running Out of Food in the Last Year: Never true    Ran Out of Food in the Last Year: Never true  Transportation Needs: No Transportation Needs (07/17/2021)   PRAPARE - Administrator, Civil Service (Medical): No    Lack of Transportation (Non-Medical): No  Physical Activity: Insufficiently Active (07/17/2021)   Exercise Vital Sign    Days of Exercise per Week: 4 days    Minutes of Exercise per Session: 20 min  Stress: No Stress Concern Present  (07/17/2021)   [employer] of Occupational Health - Occupational Stress Questionnaire    Feeling of Stress : Only a little  Social Connections: Moderately Isolated (07/06/2020)   Social Connection and Isolation Panel [NHANES]    Frequency of Communication with Friends and Family: More than three times a week    Frequency of Social Gatherings with Friends and Family: Twice a week    Attends Religious Services: More than 4 times per year    Active Member of Clubs or Organizations: No    Attends Banker Meetings: Never    Marital Status: Divorced   Vitals:   08/08/22 0658  BP: 122/80  Pulse: 76  Resp: 16  Temp: 98.5 F (36.9 C)  SpO2: 97%   Body mass index is 27.41 kg/m.  Physical Exam Vitals and nursing note reviewed.  Constitutional:      General: She is not in acute distress.    Appearance: She is well-developed.  HENT:     Head: Normocephalic and atraumatic.     Mouth/Throat:     Mouth: Mucous membranes are moist.     Pharynx: Oropharynx is clear.      Comments: No tongue lesions appreciated. Eyes:     Conjunctiva/sclera: Conjunctivae normal.  Cardiovascular:     Rate and Rhythm: Normal rate and regular rhythm.     Pulses:          Posterior tibial pulses are 2+ on the right side and 2+ on the left side.     Heart sounds: No murmur heard.    Comments: Varicose veins LE, bilateral. Pulmonary:     Effort: Pulmonary effort is normal. No respiratory distress.     Breath sounds: Normal breath sounds.  Abdominal:     Palpations: Abdomen is soft. There is no mass.     Tenderness: There is no abdominal tenderness.  Musculoskeletal:     Right hip: Tenderness (with flexion) present. No bony tenderness or crepitus. Decreased range of motion (Flexion).     Left hip: No tenderness. Decreased range of motion (No significant.).  Lymphadenopathy:     Cervical: No cervical adenopathy.  Skin:    General: Skin is warm.     Findings: No erythema or rash.   Neurological:     General: No focal deficit present.     Mental Status: She is alert and oriented to person, place, and time.     Cranial Nerves: No cranial nerve deficit.     Gait: Gait normal.  Psychiatric:        Mood and Affect: Mood and affect normal.   ASSESSMENT AND PLAN:  [name] was seen today for medical management of chronic issues.  Diagnoses and all orders for this visit:  Gum disease Possibly small abscess, we discussed other possible causes. Antibiotic treatment recommended, we discussed some side effects. Recommend OTC probiotic, trying to decrease risk of GI side effects. Follow-up with her dentist if problem is persistent. Monitor for new symptoms.  -     amoxicillin-clavulanate (AUGMENTIN) 875-125 MG tablet; Take 1 tablet by mouth 2 (two) times daily for 7 days.  Essential hypertension, benign BP adequately controlled. Continue monitoring BP regularly. Continue Losartan 50 mg daily and low salt diet. F/U in 6 months.  Depression, major, recurrent, in partial remission Overall she feels like Celexa 10 mg is helping, so no changes.  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 Tylenol 500 mg 2-3 times per day as needed and stretching exercises recommended. If problem gets worse, Ortho evaluation can be considered.  Return in about 6 months (around 02/07/2023) for CPE, Labs.  Maeven Mcdougall G. [place], MD  [facility]. [place] office.

## 2022-08-08 ENCOUNTER — Ambulatory Visit (INDEPENDENT_AMBULATORY_CARE_PROVIDER_SITE_OTHER): Payer: PPO | Admitting: Family Medicine

## 2022-08-08 ENCOUNTER — Encounter: Payer: Self-pay | Admitting: Family Medicine

## 2022-08-08 VITALS — BP 122/80 | HR 76 | Temp 98.5°F | Resp 16 | Ht 67.0 in | Wt 175.0 lb

## 2022-08-08 DIAGNOSIS — F320 Major depressive disorder, single episode, mild: Secondary | ICD-10-CM

## 2022-08-08 DIAGNOSIS — M25551 Pain in right hip: Secondary | ICD-10-CM

## 2022-08-08 DIAGNOSIS — F3341 Major depressive disorder, recurrent, in partial remission: Secondary | ICD-10-CM | POA: Diagnosis not present

## 2022-08-08 DIAGNOSIS — K069 Disorder of gingiva and edentulous alveolar ridge, unspecified: Secondary | ICD-10-CM | POA: Diagnosis not present

## 2022-08-08 DIAGNOSIS — I10 Essential (primary) hypertension: Secondary | ICD-10-CM

## 2022-08-08 DIAGNOSIS — M25552 Pain in left hip: Secondary | ICD-10-CM

## 2022-08-08 MED ORDER — AMOXICILLIN-POT CLAVULANATE 875-125 MG PO TABS: 1.0000 | ORAL_TABLET | Freq: Two times a day (BID) | ORAL | 0 refills | Status: AC

## 2022-08-08 NOTE — Assessment & Plan Note (Signed)
We discussed possible etiologies, most likely hip OA. Tylenol 500 mg 2-3 times per day as needed and stretching exercises recommended. If problem gets worse, Ortho evaluation can be considered.

## 2022-08-08 NOTE — Assessment & Plan Note (Signed)
Overall she feels like Celexa 10 mg is helping, so no changes.

## 2022-08-08 NOTE — Patient Instructions (Addendum)
A few things to remember from today's visit:  Essential hypertension, benign  Recurrent major depressive disorder, in partial remission  Gum disease - Plan: amoxicillin-clavulanate (AUGMENTIN) 875-125 MG tablet  Bilateral hip pain  Antibiotic started today for possible gum abscess. A daily probiotic may help to prevent diarrhea, Align 1 cap daily for 2-3 weeks. Arrange appt with your dentist if problem is persistent.  Hip pain is most likely arthritis. Tylenol 500 gm 2-3 times per day as needed and stretching exercises may help.  If you need refills for medications you take chronically, please call your pharmacy. Do not use My Chart to request refills or for acute issues that need immediate attention. If you send a my chart message, it may take a few days to be addressed, specially if I am not in the office.  Please be sure medication list is accurate. If a new problem present, please set up appointment sooner than planned today.

## 2022-08-08 NOTE — Assessment & Plan Note (Signed)
BP adequately controlled. Continue monitoring BP regularly. Continue Losartan 50 mg daily and low salt diet. F/U in 6 months.

## 2022-08-20 DIAGNOSIS — L570 Actinic keratosis: Secondary | ICD-10-CM | POA: Diagnosis not present

## 2022-08-20 DIAGNOSIS — L820 Inflamed seborrheic keratosis: Secondary | ICD-10-CM | POA: Diagnosis not present

## 2022-11-08 ENCOUNTER — Telehealth: Payer: Self-pay

## 2022-11-08 NOTE — Telephone Encounter (Signed)
LVM for patient to call back [phone], or to call PCP office to schedule follow up apt. AS, CMA  

## 2022-11-13 ENCOUNTER — Other Ambulatory Visit: Payer: Self-pay | Admitting: Family Medicine

## 2022-11-13 DIAGNOSIS — F3341 Major depressive disorder, recurrent, in partial remission: Secondary | ICD-10-CM

## 2022-11-29 ENCOUNTER — Telehealth: Payer: Self-pay

## 2022-11-29 NOTE — Telephone Encounter (Signed)
LVM for patient to call back [phone], or to call PCP office to schedule follow up apt. AS, CMA  

## 2023-01-02 ENCOUNTER — Other Ambulatory Visit: Payer: Self-pay | Admitting: Family Medicine

## 2023-01-02 DIAGNOSIS — I10 Essential (primary) hypertension: Secondary | ICD-10-CM

## 2023-02-15 ENCOUNTER — Ambulatory Visit: Payer: PPO | Admitting: Family Medicine

## 2023-05-14 DIAGNOSIS — L700 Acne vulgaris: Secondary | ICD-10-CM | POA: Diagnosis not present

## 2023-05-14 DIAGNOSIS — L570 Actinic keratosis: Secondary | ICD-10-CM | POA: Diagnosis not present

## 2023-07-02 ENCOUNTER — Ambulatory Visit (INDEPENDENT_AMBULATORY_CARE_PROVIDER_SITE_OTHER): Payer: PPO | Admitting: Family Medicine

## 2023-07-02 DIAGNOSIS — Z1231 Encounter for screening mammogram for malignant neoplasm of breast: Secondary | ICD-10-CM

## 2023-07-02 DIAGNOSIS — E2839 Other primary ovarian failure: Secondary | ICD-10-CM

## 2023-07-02 DIAGNOSIS — Z0000 Encounter for general adult medical examination without abnormal findings: Secondary | ICD-10-CM

## 2023-07-02 NOTE — Progress Notes (Signed)
 PATIENT CHECK-IN and HEALTH RISK ASSESSMENT QUESTIONNAIRE:  -completed by phone/video    Pre-Visit Check-in: 1)Vitals (height, wt, BP, etc) - record in vitals section for visit on day of visit Request home vitals (wt, BP, etc.) and enter into vitals, THEN update Vital Signs SmartPhrase below at the top of the HPI. See below.  2)Review and Update Medications, Allergies PMH, Surgeries, Social history in Epic 3)Hospitalizations in the last year with date/reason? n  4)Review and Update Care Team (patient's specialists) in Epic 5) Complete PHQ9 in Epic  6) Complete Fall Screening in Epic 7)Review all Health Maintenance Due and order under PCP if not done.  Medicare Wellness Patient Questionnaire:  Answer theses question about your habits: How often do you have a drink containing alcohol? 1 drink here and there Have you ever smoked?no On average, how many days per week do you engage in moderate to strenuous exercise (like a brisk walk)? Has lots of stairs up to apartment and does several times per day, 20 minutes of walking 3 days per week Typical diet: feels diet is pretty good, she tries to eat healthy - but also doesn't get enough veggies.  Social connections: lots of activities with grandchildren, stays busy with church to and women's bible study  Answer theses question about your everyday activities: Can you perform most household chores?y Are you deaf or have significant trouble hearing?n Do you feel that you have a problem with memory?n Do you feel safe at home?y Last dentist visit? yes 8. Do you have any difficulty performing your everyday activities?n Are you having any difficulty walking, taking medications on your own, and or difficulty managing daily home needs?n Do you have difficulty walking or climbing stairs?n Do you have difficulty dressing or bathing?n Do you have difficulty doing errands alone such as visiting a doctor's office or shopping?n Do you currently have any  difficulty preparing food and eating?n Do you currently have any difficulty using the toilet?n Do you have any difficulty managing your finances?n Do you have any difficulties with housekeeping of managing your housekeeping?n   Do you have Advanced Directives in place (Living Will, Healthcare Power or Attorney)? yes   Last eye Exam and location? Sees eye doctor on regular   Do you currently use prescribed or non-prescribed narcotic or opioid pain medications?no  Do you have a history or close family history of breast, ovarian, tubal or peritoneal cancer or a family member with BRCA (breast cancer susceptibility 1 and 2) gene mutations?     ----------------------------------------------------------------------------------------------------------------------------------------------------------------------------------------------------------------------  Because this visit was a virtual/telehealth visit, some criteria may be missing or patient reported. Any vitals not documented were not able to be obtained and vitals that have been documented are patient reported.    MEDICARE ANNUAL PREVENTIVE VISIT WITH PROVIDER: (Welcome to Medicare, initial annual wellness or annual wellness exam)  Virtual Visit via Video Note  I connected with [name] on 07/02/23 by a video enabled telemedicine application and verified that I am speaking with the correct person using two identifiers.  Location patient: home Location provider:work or home office Persons participating in the virtual visit: patient, provider  Concerns and/or follow up today: stable   See HM section in Epic for other details of completed HM.    ROS: negative for report of fevers, unintentional weight loss, vision changes, vision loss, hearing loss or change, chest pain, sob, hemoptysis, melena, hematochezia, hematuria, falls, bleeding or bruising, thoughts of suicide or self harm, memory loss  Patient-completed  extensive health risk assessment - reviewed and discussed with the patient: See Health Risk Assessment completed with patient prior to the visit either above or in recent phone note. This was reviewed in detailed with the patient today and appropriate recommendations, orders and referrals were placed as needed per Summary below and patient instructions.   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   Hypertension    Skin cancer    skin cancers removed    Past Surgical History:  Procedure Laterality Date   ABDOMINAL HYSTERECTOMY  2007   BREAST LUMPECTOMY WITH RADIOACTIVE SEED LOCALIZATION Right 02/18/2014   Procedure: RIGHT BREAST DUCT EXCISION AND RADIOACTIVE SEED LOCALIZED BREAST LUMPECTOMY;  Surgeon: Avel Peace, MD;  Location: [facility];  Service: General;  Laterality: Right;   BUNIONECTOMY     left   DILATION AND CURETTAGE OF UTERUS     DVT  2009   thrombectomy lt ll   right shoulder  2010   RCR-shoulder   THROMBECTOMY  2009   left common iliac vein-stent   TUBAL LIGATION      Social History   Socioeconomic History   Marital status: Divorced    Spouse name: Not on file   Number of children: Not on file   Years of education: Not on file   Highest education level: Not on file  Occupational History   Not on file  Tobacco Use   Smoking status: Never   Smokeless tobacco: Never  Vaping Use   Vaping status: Never Used  Substance and Sexual Activity   Alcohol use: Yes    Comment: rarely   Drug use: No   Sexual activity: Yes    Birth control/protection: Surgical  Other Topics Concern   Not on file  Social History Narrative    Not on file   Social Drivers of Health   Financial Resource Strain: Low Risk  (07/17/2021)   Overall Financial Resource Strain (CARDIA)    Difficulty of Paying Living Expenses: Not hard at all  Food Insecurity: No Food Insecurity (07/17/2021)   Hunger Vital Sign    Worried About Running Out of Food in the Last Year: Never true    Ran Out of Food in the Last Year: Never true  Transportation Needs: No Transportation Needs (07/17/2021)   PRAPARE - Administrator, Civil Service (Medical): No    Lack of Transportation (Non-Medical): No  Physical Activity: Insufficiently Active (07/17/2021)   Exercise Vital Sign    Days of Exercise per Week: 4 days    Minutes of Exercise per Session: 20 min  Stress: No Stress Concern Present (07/17/2021)   [employer] of Occupational Health - Occupational Stress Questionnaire    Feeling of Stress : Only a little  Social Connections: Moderately Isolated (07/06/2020)   Social Connection and Isolation Panel [NHANES]    Frequency of Communication with Friends and Family: More than three times a week    Frequency of Social Gatherings with Friends and Family: Twice a week    Attends Religious Services: More than 4 times per year    Active Member of [employer] or Organizations: No    Attends [employer]  or Organization Meetings: Never    Marital Status: Divorced  Catering manager Violence: Not At Risk (07/06/2020)   Humiliation, Afraid, Rape, and Kick questionnaire    Fear of Current or Ex-Partner: No    Emotionally Abused: No    Physically Abused: No    Sexually Abused: No    Family History  Problem Relation Age of Onset   Diabetes Mother    Cancer Mother        multiple myloma   Heart disease Father    Diabetes Brother    Diabetes Brother    Diabetes Brother    Diabetes Brother    Diabetes Brother     Current Outpatient Medications on File Prior to Visit  Medication Sig Dispense Refill   Calcium Carbonate-Vitamin D 600-200 MG-UNIT TABS Take by  mouth.     citalopram (CELEXA) 10 MG tablet Take 1 tablet by mouth once daily 90 tablet 2   ELDERBERRY PO Take 1 tablet by mouth daily.     fluticasone (FLONASE) 50 MCG/ACT nasal spray Place 1-2 sprays into both nostrils daily as needed for allergies or rhinitis. 16 g 1   losartan (COZAAR) 50 MG tablet TAKE 1 TABLET BY MOUTH EVERY DAY 90 tablet 2   Multiple Vitamins-Minerals (MULTIVITAMIN WITH MINERALS) tablet Take by mouth.     TURMERIC PO Take by mouth daily.     vitamin B-12 (CYANOCOBALAMIN) 1000 MCG tablet Take by mouth.     No current facility-administered medications on file prior to visit.    Allergies  Allergen Reactions   Sulfa Antibiotics Rash       Physical Exam Vitals requested from patient and listed below if patient had equipment and was able to obtain at home for this virtual visit: There were no vitals filed for this visit. Estimated body mass index is 27.41 kg/m as calculated from the following:   Height as of 08/08/22: 5\' 7"  (1.702 m).   Weight as of 08/08/22: 175 lb (79.4 kg).  EKG (optional): deferred due to virtual visit  GENERAL: alert, oriented, no acute distress detected, full vision exam deferred due to pandemic and/or virtual encounter   HEENT: atraumatic, conjunttiva clear, no obvious abnormalities on inspection of external nose and ears  NECK: normal movements of the head and neck  LUNGS: on inspection no signs of respiratory distress, breathing rate appears normal, no obvious gross SOB, gasping or wheezing  CV: no obvious cyanosis  MS: moves all visible extremities without noticeable abnormality  PSYCH/NEURO: pleasant and cooperative, no obvious depression or anxiety, speech and thought processing grossly intact, Cognitive function grossly intact  Flowsheet Row Office Visit from 08/08/2022 in [facility]  PHQ-9 Total Score 0           07/02/2023    5:43 PM 08/08/2022    7:08 AM 07/24/2022    3:09 PM 07/06/2022     2:01 PM 02/07/2022    8:39 AM  Depression screen PHQ 2/9  Decreased Interest 0 0 0 0 0  Down, Depressed, Hopeless 0 0 0 0 0  PHQ - 2 Score 0 0 0 0 0  Altered sleeping  0 1 0 0  Tired, decreased energy  0 0 0 0  Change in appetite  0 0 0 0  Feeling bad or failure about yourself   0 0 1 1  Trouble concentrating  0 0 0 0  Moving slowly or fidgety/restless  0 0 0 0  Suicidal thoughts  0 0 0 0  PHQ-9 Score  0 1 1 1   Difficult doing work/chores  Not difficult at all Not difficult at all Somewhat difficult Somewhat difficult       02/07/2022    8:39 AM 07/06/2022    2:01 PM 07/24/2022    3:09 PM 07/02/2023    5:35 PM 07/02/2023    6:52 PM  Fall Risk  Falls in the past year? 0 0 0 0 0  Was there an injury with Fall? 0 0 0 0 0  Fall Risk Category Calculator 0 0 0 0 0  Fall Risk Category (Retired) Low      (RETIRED) Patient Fall Risk Level Low fall risk      Patient at Risk for Falls Due to No Fall Risks Other (Comment)     Fall risk Follow up Falls evaluation completed Falls evaluation completed Falls evaluation completed Falls evaluation completed;Education provided      SUMMARY AND PLAN:  Encounter for Medicare annual wellness exam  Encounter for screening mammogram for malignant neoplasm of breast - Plan: MM 3D SCREENING MAMMOGRAM BILATERAL BREAST  Estrogen deficiency - Plan: DG Bone Density  Discussed applicable health maintenance/preventive health measures and advised and referred or ordered per patient preferences: -ordered mammogram and bone density per her preference, advised for her to call and schedule the mammogram and to let PCP know if any issues with scheduling and/or if not contacted about the bone density in the next few weeks -discussed vaccines due - she declined Health Maintenance  Topic Date Due   COVID-19 Vaccine (3 - 2024-25 season) 07/18/2023 (Originally 12/16/2022)   Pneumonia Vaccine 48+ Years old (1 of 1 - PCV) 07/24/2023 (Originally 01/25/2015)    Zoster Vaccines- Shingrix (1 of 2) 10/02/2023 (Originally 01/24/1970)   MAMMOGRAM  09/13/2023   Medicare Annual Wellness (AWV)  07/01/2024   Colonoscopy  10/30/2028   DEXA SCAN  Completed   Hepatitis C Screening  Completed   HPV VACCINES  Aged Out   DTaP/Tdap/Td  Discontinued   INFLUENZA VACCINE  Discontinued      Education and counseling on the following was provided based on the above review of health and a plan/checklist for the patient, along with additional information discussed, was provided for the patient in the patient instructions :  -Provided safe balance exercises that can be done at home to improve balance and discussed exercise guidelines for adults with include balance exercises at least 3 days per week - see patient instructions. -Advised and counseled on a healthy lifestyle - including the importance of a healthy diet, regular physical activity, social connections  -Reviewed patient's current diet. Advised and counseled on a whole foods based healthy diet. A summary of a healthy diet was provided in the Patient Instructions.  -reviewed patient's current physical activity level and discussed exercise guidelines for adults. Discussed community resources and ideas for safe exercise at home to assist in meeting exercise guideline recommendations in a safe and healthy way.  -Advise yearly dental visits at minimum and regular eye exams   Follow up: see patient instructions     Patient Instructions  I really enjoyed getting to talk with you today! I am available on Tuesdays and Thursdays for virtual visits if you have any questions or concerns, or if I can be of any further assistance.   CHECKLIST FROM ANNUAL WELLNESS VISIT:  -Follow up (please call to schedule if not scheduled after visit):   -yearly for annual wellness visit with primary  care office  Here is a list of your preventive care/health maintenance measures and the plan for each if any are due:  PLAN For any  measures below that may be due:  -placed orders for mammogram and the dexa. Please call the breast center to schedule your mammogram asap. Please call our office if any trouble scheduling and/or if you are not contacted regarding the dexa in the next 2 weeks. Thanks.   Health Maintenance  Topic Date Due   COVID-19 Vaccine (3 - 2024-25 season) 07/18/2023 (Originally 12/16/2022)   Pneumonia Vaccine 70+ Years old (1 of 1 - PCV) 07/24/2023 (Originally 01/25/2015)   Zoster Vaccines- Shingrix (1 of 2) 10/02/2023 (Originally 01/24/1970)   MAMMOGRAM  09/13/2023   Medicare Annual Wellness (AWV)  07/01/2024   Colonoscopy  10/30/2028   DEXA SCAN  Completed   Hepatitis C Screening  Completed   HPV VACCINES  Aged Out   DTaP/Tdap/Td  Discontinued   INFLUENZA VACCINE  Discontinued    -See a dentist at least yearly  -Get your eyes checked and then per your eye specialist's recommendations  -Other issues addressed today:   -I have included below further information regarding a healthy whole foods based diet, physical activity guidelines for adults, stress management and opportunities for social connections. I hope you find this information useful.   -----------------------------------------------------------------------------------------------------------------------------------------------------------------------------------------------------------------------------------------------------------    NUTRITION: -eat real food: lots of colorful vegetables (half the plate) and fruits -5-7 servings of vegetables and fruits per day (fresh or steamed is best), exp. 2 servings of vegetables with lunch and dinner and 2 servings of fruit per day. Berries and greens such as kale and collards are great choices.  -consume on a regular basis:  fresh fruits, fresh veggies, fish, nuts, seeds, healthy oils (such as olive oil, avocado oil), whole grains (make sure for bread/pasta/crackers/etc., that the first  ingredient on label contains the word "whole"), legumes. -can eat small amounts of dairy and lean meat (no larger than the palm of your hand), but avoid processed meats such as ham, bacon, lunch meat, etc. -drink water -try to avoid fast food and pre-packaged foods, processed meat, ultra processed foods/beverages (donuts, candy, etc.) -most experts advise limiting sodium to < 2300mg  per day, should limit further is any chronic conditions such as high blood pressure, heart disease, diabetes, etc. The American Heart Association advised that < 1500mg  is is ideal -try to avoid foods/beverages that contain any ingredients with names you do not recognize  -try to avoid foods/beverages  with added sugar or sweeteners/sweets  -try to avoid sweet drinks (including diet drinks): soda, juice, Gatorade, sweet tea, power drinks, diet drinks -try to avoid white rice, white bread, pasta (unless whole grain)  EXERCISE GUIDELINES FOR ADULTS: -if you wish to increase your physical activity, do so gradually and with the approval of your doctor -STOP and seek medical care immediately if you have any chest pain, chest discomfort or trouble breathing when starting or increasing exercise  -move and stretch your body, legs, feet and arms when sitting for long periods -Physical activity guidelines for optimal health in adults: -get at least 150 minutes per week of moderate exercise (can talk, but not sing); this is about 20-30 minutes of sustained activity 5-7 days per week or two 10-15 minute episodes of sustained activity 5-7 days per week -do some muscle building/resistance training/strength training at least 2 days per week  -balance exercises 3+ days per week:   Stand somewhere where you have something sturdy to  hold onto if you lose balance    1) lift up on toes, then back down, start with 5x per day and work up to 20x   2) stand and lift one leg straight out to the side so that foot is a few inches of the floor,  start with 5x each side and work up to 20x each side   3) stand on one foot, start with 5 seconds each side and work up to 20 seconds on each side  If you need ideas or help with getting more active:  -Silver sneakers https://tools.silversneakers.com  -Walk with a Doc: http://www.duncan-williams.com/  -try to include resistance (weight lifting/strength building) and balance exercises twice per week: or the following link for ideas: http://castillo-powell.com/  BuyDucts.dk  STRESS MANAGEMENT: -can try meditating, or just sitting quietly with deep breathing while intentionally relaxing all parts of your body for 5 minutes daily -if you need further help with stress, anxiety or depression please follow up with your primary doctor or contact the wonderful folks at [facility]: [phone]  SOCIAL CONNECTIONS: -options in [place] if you wish to engage in more social and exercise related activities:  -Silver sneakers https://tools.silversneakers.com  -Walk with a Doc: http://www.duncan-williams.com/  -Check out the [facility] Active Adults 50+ section on the [place] of [employer] (hiking clubs, book clubs, cards and games, chess, exercise classes, aquatic classes and much more) - see the website for details: https://www.-[place].gov/departments/parks-recreation/active-adults50  -YouTube has lots of exercise videos for different ages and abilities as well  -[facility] (a variety of indoor and outdoor inperson activities for adults). 6186690585. [address]  -Virtual Online Classes (a variety of topics): see seniorplanet.org or call ([phone])704-9353  -consider volunteering at a school, hospice center, church, senior center or elsewhere            Terressa Koyanagi, DO

## 2023-07-02 NOTE — Patient Instructions (Signed)
 I really enjoyed getting to talk with you today! I am available on Tuesdays and Thursdays for virtual visits if you have any questions or concerns, or if I can be of any further assistance.   CHECKLIST FROM ANNUAL WELLNESS VISIT:  -Follow up (please call to schedule if not scheduled after visit):   -yearly for annual wellness visit with primary care office  Here is a list of your preventive care/health maintenance measures and the plan for each if any are due:  PLAN For any measures below that may be due:  -placed orders for mammogram and the dexa. Please call the breast center to schedule your mammogram asap. Please call our office if any trouble scheduling and/or if you are not contacted regarding the dexa in the next 2 weeks. Thanks.   Health Maintenance  Topic Date Due   COVID-19 Vaccine (3 - 2024-25 season) 07/18/2023 (Originally 12/16/2022)   Pneumonia Vaccine 64+ Years old (1 of 1 - PCV) 07/24/2023 (Originally 01/25/2015)   Zoster Vaccines- Shingrix (1 of 2) 10/02/2023 (Originally 01/24/1970)   MAMMOGRAM  09/13/2023   Medicare Annual Wellness (AWV)  07/01/2024   Colonoscopy  10/30/2028   DEXA SCAN  Completed   Hepatitis C Screening  Completed   HPV VACCINES  Aged Out   DTaP/Tdap/Td  Discontinued   INFLUENZA VACCINE  Discontinued    -See a dentist at least yearly  -Get your eyes checked and then per your eye specialist's recommendations  -Other issues addressed today:   -I have included below further information regarding a healthy whole foods based diet, physical activity guidelines for adults, stress management and opportunities for social connections. I hope you find this information useful.   -----------------------------------------------------------------------------------------------------------------------------------------------------------------------------------------------------------------------------------------------------------    NUTRITION: -eat real  food: lots of colorful vegetables (half the plate) and fruits -5-7 servings of vegetables and fruits per day (fresh or steamed is best), exp. 2 servings of vegetables with lunch and dinner and 2 servings of fruit per day. Berries and greens such as kale and collards are great choices.  -consume on a regular basis:  fresh fruits, fresh veggies, fish, nuts, seeds, healthy oils (such as olive oil, avocado oil), whole grains (make sure for bread/pasta/crackers/etc., that the first ingredient on label contains the word "whole"), legumes. -can eat small amounts of dairy and lean meat (no larger than the palm of your hand), but avoid processed meats such as ham, bacon, lunch meat, etc. -drink water -try to avoid fast food and pre-packaged foods, processed meat, ultra processed foods/beverages (donuts, candy, etc.) -most experts advise limiting sodium to < 2300mg  per day, should limit further is any chronic conditions such as high blood pressure, heart disease, diabetes, etc. The American Heart Association advised that < 1500mg  is is ideal -try to avoid foods/beverages that contain any ingredients with names you do not recognize  -try to avoid foods/beverages  with added sugar or sweeteners/sweets  -try to avoid sweet drinks (including diet drinks): soda, juice, Gatorade, sweet tea, power drinks, diet drinks -try to avoid white rice, white bread, pasta (unless whole grain)  EXERCISE GUIDELINES FOR ADULTS: -if you wish to increase your physical activity, do so gradually and with the approval of your doctor -STOP and seek medical care immediately if you have any chest pain, chest discomfort or trouble breathing when starting or increasing exercise  -move and stretch your body, legs, feet and arms when sitting for long periods -Physical activity guidelines for optimal health in adults: -get at least 150 minutes per  week of moderate exercise (can talk, but not sing); this is about 20-30 minutes of sustained  activity 5-7 days per week or two 10-15 minute episodes of sustained activity 5-7 days per week -do some muscle building/resistance training/strength training at least 2 days per week  -balance exercises 3+ days per week:   Stand somewhere where you have something sturdy to hold onto if you lose balance    1) lift up on toes, then back down, start with 5x per day and work up to 20x   2) stand and lift one leg straight out to the side so that foot is a few inches of the floor, start with 5x each side and work up to 20x each side   3) stand on one foot, start with 5 seconds each side and work up to 20 seconds on each side  If you need ideas or help with getting more active:  -Silver sneakers https://tools.silversneakers.com  -Walk with a Doc: http://www.duncan-williams.com/  -try to include resistance (weight lifting/strength building) and balance exercises twice per week: or the following link for ideas: http://castillo-powell.com/  BuyDucts.dk  STRESS MANAGEMENT: -can try meditating, or just sitting quietly with deep breathing while intentionally relaxing all parts of your body for 5 minutes daily -if you need further help with stress, anxiety or depression please follow up with your primary doctor or contact the wonderful folks at [facility]: [phone]  SOCIAL CONNECTIONS: -options in [place] if you wish to engage in more social and exercise related activities:  -Silver sneakers https://tools.silversneakers.com  -Walk with a Doc: http://www.duncan-williams.com/  -Check out the [facility] Active Adults 50+ section on the [place] of [employer] (hiking clubs, book clubs, cards and games, chess, exercise classes, aquatic classes and much more) - see the website for details: https://www.[place].gov/departments/parks-recreation/active-adults50  -YouTube has lots of exercise  videos for different ages and abilities as well  -[facility] (a variety of indoor and outdoor inperson activities for adults). [phone]. [address]  -Virtual Online Classes (a variety of topics): see seniorplanet.org or call [phone]  -consider volunteering at a school, hospice center, church, senior center or elsewhere

## 2023-07-17 ENCOUNTER — Other Ambulatory Visit: Payer: Self-pay | Admitting: Family Medicine

## 2023-07-17 DIAGNOSIS — Z0000 Encounter for general adult medical examination without abnormal findings: Secondary | ICD-10-CM

## 2023-07-17 DIAGNOSIS — N63 Unspecified lump in unspecified breast: Secondary | ICD-10-CM

## 2023-07-17 DIAGNOSIS — E2839 Other primary ovarian failure: Secondary | ICD-10-CM

## 2023-07-17 DIAGNOSIS — Z1231 Encounter for screening mammogram for malignant neoplasm of breast: Secondary | ICD-10-CM

## 2023-07-25 ENCOUNTER — Ambulatory Visit

## 2023-08-09 ENCOUNTER — Other Ambulatory Visit: Payer: Self-pay | Admitting: Family Medicine

## 2023-08-09 DIAGNOSIS — F3341 Major depressive disorder, recurrent, in partial remission: Secondary | ICD-10-CM

## 2023-08-21 ENCOUNTER — Other Ambulatory Visit: Payer: Self-pay | Admitting: Family Medicine

## 2023-08-21 ENCOUNTER — Ambulatory Visit
Admission: RE | Admit: 2023-08-21 | Discharge: 2023-08-21 | Disposition: A | Discharge status: Home / Self Care | Source: Ambulatory Visit | Attending: Family Medicine | Admitting: Family Medicine

## 2023-08-21 DIAGNOSIS — N6315 Unspecified lump in the right breast, overlapping quadrants: Secondary | ICD-10-CM | POA: Diagnosis not present

## 2023-08-21 DIAGNOSIS — N6331 Unspecified lump in axillary tail of the right breast: Secondary | ICD-10-CM | POA: Diagnosis not present

## 2023-08-21 DIAGNOSIS — N6324 Unspecified lump in the left breast, lower inner quadrant: Secondary | ICD-10-CM | POA: Diagnosis not present

## 2023-08-21 DIAGNOSIS — R2231 Localized swelling, mass and lump, right upper limb: Secondary | ICD-10-CM

## 2023-08-21 DIAGNOSIS — N63 Unspecified lump in unspecified breast: Secondary | ICD-10-CM

## 2023-08-21 DIAGNOSIS — N6001 Solitary cyst of right breast: Secondary | ICD-10-CM | POA: Diagnosis not present

## 2023-08-21 DIAGNOSIS — N6002 Solitary cyst of left breast: Secondary | ICD-10-CM | POA: Diagnosis not present

## 2023-08-21 DIAGNOSIS — N6310 Unspecified lump in the right breast, unspecified quadrant: Secondary | ICD-10-CM

## 2023-08-22 ENCOUNTER — Ambulatory Visit
Admission: RE | Admit: 2023-08-22 | Discharge: 2023-08-22 | Disposition: A | Discharge status: Home / Self Care | Source: Ambulatory Visit | Attending: Family Medicine | Admitting: Family Medicine

## 2023-08-22 ENCOUNTER — Other Ambulatory Visit: Payer: Self-pay | Admitting: Family Medicine

## 2023-08-22 DIAGNOSIS — R2231 Localized swelling, mass and lump, right upper limb: Secondary | ICD-10-CM

## 2023-08-22 DIAGNOSIS — N6002 Solitary cyst of left breast: Secondary | ICD-10-CM

## 2023-08-22 DIAGNOSIS — C50811 Malignant neoplasm of overlapping sites of right female breast: Secondary | ICD-10-CM | POA: Diagnosis not present

## 2023-08-22 DIAGNOSIS — N6310 Unspecified lump in the right breast, unspecified quadrant: Secondary | ICD-10-CM

## 2023-08-22 DIAGNOSIS — C773 Secondary and unspecified malignant neoplasm of axilla and upper limb lymph nodes: Secondary | ICD-10-CM | POA: Diagnosis not present

## 2023-08-22 DIAGNOSIS — C50911 Malignant neoplasm of unspecified site of right female breast: Secondary | ICD-10-CM | POA: Diagnosis not present

## 2023-08-22 HISTORY — PX: BREAST BIOPSY: SHX20

## 2023-08-23 ENCOUNTER — Ambulatory Visit
Admission: RE | Admit: 2023-08-23 | Discharge: 2023-08-23 | Disposition: A | Discharge status: Home / Self Care | Source: Ambulatory Visit | Attending: Family Medicine | Admitting: Family Medicine

## 2023-08-23 DIAGNOSIS — N6325 Unspecified lump in the left breast, overlapping quadrants: Secondary | ICD-10-CM | POA: Diagnosis not present

## 2023-08-23 DIAGNOSIS — N6002 Solitary cyst of left breast: Secondary | ICD-10-CM

## 2023-08-23 DIAGNOSIS — N6012 Diffuse cystic mastopathy of left breast: Secondary | ICD-10-CM | POA: Diagnosis not present

## 2023-08-23 DIAGNOSIS — N6324 Unspecified lump in the left breast, lower inner quadrant: Secondary | ICD-10-CM | POA: Diagnosis not present

## 2023-08-23 HISTORY — PX: BREAST BIOPSY: SHX20

## 2023-08-23 LAB — SURGICAL PATHOLOGY

## 2023-08-26 LAB — SURGICAL PATHOLOGY

## 2023-08-27 ENCOUNTER — Encounter: Payer: Self-pay | Admitting: *Deleted

## 2023-08-27 NOTE — Progress Notes (Signed)
 Reached out to [employer] to introduce myself as the office RN Navigator and explain our new patient process. Reviewed the reason for their referral and scheduled their new patient appointment along with labs. Provided address and directions to the office including call back phone number. Reviewed with patient any concerns they may have or any possible barriers to attending their appointment.   Informed patient about my role as a navigator and that I will meet with them prior to their New Patient appointment and more fully discuss what services I can provide. At this time patient has no further questions or needs.    Patient was seen by this office in the past, before 2012, for history of clots. She was offered several earlier appointments but due to her work and personal schedule she wishes to schedule next Friday.   Oncology Nurse Navigator Documentation     08/27/2023   10:00 AM  Oncology Nurse Navigator Flowsheets  Abnormal Finding Date 08/21/2023  Confirmed Diagnosis Date 08/22/2023  Navigator Follow Up Date: 09/06/2023  Navigator Follow Up Reason: New Patient Appointment  Navigator Location [facility]  Referral Date to RadOnc/MedOnc 08/23/2023  Navigator Encounter Type Introductory Phone Call  Patient Visit Type MedOnc  Treatment Phase Pre-Tx/Tx Discussion  Barriers/Navigation Needs Coordination of Care;Education  Education Other  Interventions Coordination of Care;Education  Acuity Level 2-Minimal Needs (1-2 Barriers Identified)  Coordination of Care Appts  Education Method Verbal  Support Groups/Services Friends and Family  Time Spent with Patient 30

## 2023-09-06 ENCOUNTER — Inpatient Hospital Stay: Admitting: Hematology & Oncology

## 2023-09-06 ENCOUNTER — Encounter: Payer: Self-pay | Admitting: *Deleted

## 2023-09-06 ENCOUNTER — Inpatient Hospital Stay: Attending: Hematology & Oncology

## 2023-09-06 ENCOUNTER — Encounter: Payer: Self-pay | Admitting: Hematology & Oncology

## 2023-09-06 VITALS — BP 146/76 | HR 70 | Temp 98.9°F | Resp 2 | Ht 67.0 in | Wt 185.0 lb

## 2023-09-06 DIAGNOSIS — C50011 Malignant neoplasm of nipple and areola, right female breast: Secondary | ICD-10-CM | POA: Diagnosis not present

## 2023-09-06 DIAGNOSIS — Z1721 Progesterone receptor positive status: Secondary | ICD-10-CM | POA: Insufficient documentation

## 2023-09-06 DIAGNOSIS — Z807 Family history of other malignant neoplasms of lymphoid, hematopoietic and related tissues: Secondary | ICD-10-CM

## 2023-09-06 DIAGNOSIS — Z79899 Other long term (current) drug therapy: Secondary | ICD-10-CM | POA: Diagnosis not present

## 2023-09-06 DIAGNOSIS — Z170 Estrogen receptor positive status [ER+]: Secondary | ICD-10-CM | POA: Insufficient documentation

## 2023-09-06 DIAGNOSIS — Z1732 Human epidermal growth factor receptor 2 negative status: Secondary | ICD-10-CM | POA: Insufficient documentation

## 2023-09-06 DIAGNOSIS — C773 Secondary and unspecified malignant neoplasm of axilla and upper limb lymph nodes: Secondary | ICD-10-CM | POA: Insufficient documentation

## 2023-09-06 LAB — CMP (CANCER CENTER ONLY)
ALT: 10 U/L (ref 0–44)
AST: 16 U/L (ref 15–41)
Albumin: 4.3 g/dL (ref 3.5–5.0)
Alkaline Phosphatase: 55 U/L (ref 38–126)
Anion gap: 7 (ref 5–15)
BUN: 13 mg/dL (ref 8–23)
CO2: 29 mmol/L (ref 22–32)
Calcium: 9.1 mg/dL (ref 8.9–10.3)
Chloride: 105 mmol/L (ref 98–111)
Creatinine: 0.83 mg/dL (ref 0.44–1.00)
GFR, Estimated: >60 mL/min (ref >60)
Glucose, Bld: 105 mg/dL — ABNORMAL HIGH (ref 70–99)
Potassium: 3.8 mmol/L (ref 3.5–5.1)
Sodium: 141 mmol/L (ref 135–145)
Total Bilirubin: 1 mg/dL (ref 0.0–1.2)
Total Protein: 6.6 g/dL (ref 6.5–8.1)

## 2023-09-06 LAB — CBC WITH DIFFERENTIAL (CANCER CENTER ONLY)
Abs Immature Granulocytes: 0 10*3/uL (ref 0.00–0.07)
Basophils Absolute: 0 10*3/uL (ref 0.0–0.1)
Basophils Relative: 1 %
Eosinophils Absolute: 0.1 10*3/uL (ref 0.0–0.5)
Eosinophils Relative: 2 %
HCT: 39.7 % (ref 36.0–46.0)
Hemoglobin: 13.5 g/dL (ref 12.0–15.0)
Immature Granulocytes: 0 %
Lymphocytes Relative: 35 %
Lymphs Abs: 1.6 10*3/uL (ref 0.7–4.0)
MCH: 31.9 pg (ref 26.0–34.0)
MCHC: 34 g/dL (ref 30.0–36.0)
MCV: 93.9 fL (ref 80.0–100.0)
Monocytes Absolute: 0.3 10*3/uL (ref 0.1–1.0)
Monocytes Relative: 7 %
Neutro Abs: 2.5 10*3/uL (ref 1.7–7.7)
Neutrophils Relative %: 55 %
Platelet Count: 180 10*3/uL (ref 150–400)
RBC: 4.23 MIL/uL (ref 3.87–5.11)
RDW: 12.3 % (ref 11.5–15.5)
WBC Count: 4.5 10*3/uL (ref 4.0–10.5)
nRBC: 0 % (ref 0.0–0.2)

## 2023-09-06 LAB — LACTATE DEHYDROGENASE: LDH: 174 U/L (ref 98–192)

## 2023-09-06 NOTE — Progress Notes (Signed)
 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 Wheeler Hammonds patient "Your Patient Navigator" handout which explains my role, areas in which I am able to help, and all the contact information for myself and the office. Also gave patient MD and Navigator business card. Reviewed with patient the general overview of expected course after initial diagnosis and time frame for all steps to be completed.  New patient packet given to patient which includes: orientation to office and staff; campus directory; education on My Chart and Advance Directives; and patient centered education on breast cancer.   Patient comes in with her son, [name]. 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 Patient works on [employer], in a non-physical role and can work from home if needed. Her schedule is flexible.   Referrals to social work and nutrition placed per protocol.   Patient completed visit with Dr. Maria [name]. Once office note dictated and orders placed, will begin scheduling. Patient knows we will likely speak on Tuesday.   Patient understands all follow up procedures and expectations. They have my number to reach out for any further clarification or additional needs.   Oncology Nurse Navigator Documentation     09/06/2023   11:00 AM  Oncology Nurse Navigator Flowsheets  Navigator Follow Up Date: 09/10/2023  Navigator Follow Up Reason: Appointment Review  Navigator Location [facility]  Navigator Encounter Type Initial MedOnc  Patient Visit Type MedOnc  Treatment Phase Pre-Tx/Tx Discussion  Barriers/Navigation Needs Coordination of Care;Education  Education Newly Diagnosed Cancer Education;Pain/ Symptom Management;Preparing for Upcoming Surgery/ Treatment  Interventions Education;Psycho-Social Support;Referrals  Acuity Level 2-Minimal Needs (1-2 Barriers  Identified)  Referrals Nutrition/dietician;Social Work  Nature conservation officer Groups/Services Friends and Family  Time Spent with Patient 30

## 2023-09-06 NOTE — Progress Notes (Unsigned)
 BP remains elevated, 146/76, instructed to monitor at home and notify PCP if BP remains over 140/90, verbalized understanding.

## 2023-09-07 LAB — CANCER ANTIGEN 27.29: CA 27.29: 23.3 U/mL (ref 0.0–38.6)

## 2023-09-08 ENCOUNTER — Other Ambulatory Visit: Payer: Self-pay | Admitting: Family Medicine

## 2023-09-08 DIAGNOSIS — F3341 Major depressive disorder, recurrent, in partial remission: Secondary | ICD-10-CM

## 2023-09-09 NOTE — Progress Notes (Signed)
 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  HPI: [name] is a very charming 73 year old postmenopausal white female.  I have seen her in the past either because of anemia.  She has been in good health.  She noticed a lump in the right breast.  This was just on routine self-exam.  She has been getting routine mammograms although has been about 2 years since she had her last one.  She underwent a diagnostic mammogram on 08/21/2023.  This showed a 3.3 cm palpable spiculated mass at 3 o'clock position in the right breast.  Also noted was a 1.2 cm area in the right axilla.  She also had a right axillary lymph node that was 6 mm.  She then underwent a biopsy.  This was on 08/22/2023.  The pathology report ([id number]) showed an invasive ductal carcinoma.  It was fairly high-grade.  It was 12 mm in length.  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  In the right axilla, there is also noted to be a lymph node that contained malignancy.  She also had a second lump in the right breast biopsy which was also an invasive ductal carcinoma.  The prognostic markers show that the tumor had a proliferation marker of 15%.  The tumor was ER positive, PR positive, HER2 negative (2+)  She was then, referred to the [facility] for evaluation..  She feels well.  She has had no problems with weight loss or weight gain..  She has not been on any type of antiestrogens.  Her first child I think was born before age 63.  She went through the change of life and her early 53s.  She has had a hysterectomy.  There has been no nausea or vomiting.  She has had no headache.  She has had no bony pain.  Has been no leg swelling.  Overall, I would have to say that her performance status is ECOG 1.    Past Medical History:  Diagnosis Date   History of DVT of  lower extremity 2007,2009   left   Hypertension    Skin cancer    skin cancers removed  :   Past Surgical History:  Procedure Laterality Date   ABDOMINAL HYSTERECTOMY  2007   BREAST BIOPSY Right 08/22/2023   US  RT BREAST BX W LOC DEV 1ST LESION IMG BX SPEC US  GUIDE 08/22/2023 [facility]MO[facility]   BREAST BIOPSY Left 08/23/2023   US  LT BREAST BX W LOC DEV 1ST LESION IMG BX SPEC US  GUIDE 08/23/2023 [facility]MO[facility]   BREAST LUMPECTOMY WITH RADIOACTIVE SEED LOCALIZATION Right 02/18/2014   Procedure: RIGHT BREAST DUCT EXCISION AND RADIOACTIVE SEED LOCALIZED BREAST LUMPECTOMY;  Surgeon: Adalberto Hollow, MD;  Location: [facility];  Service: General;  Laterality: Right;   BUNIONECTOMY     left   DILATION AND CURETTAGE OF UTERUS     DVT  2009   thrombectomy lt ll   right shoulder  2010   RCR-shoulder   THROMBECTOMY  2009   left common iliac vein-stent   TUBAL LIGATION    :   Current Outpatient Medications:    Calcium Carbonate-Vitamin D 600-200 MG-UNIT TABS, Take by mouth daily., Disp: , Rfl:    citalopram  (CELEXA ) 10 MG tablet, Take 1 tablet (10 mg total) by mouth daily.  Due for follow up, Disp: 30 tablet, Rfl: 0   Collagen-Vitamin C-Biotin (COLLAGEN PO), Take by mouth daily., Disp: , Rfl:    ELDERBERRY PO, Take 1 tablet by mouth daily., Disp: , Rfl:    losartan  (COZAAR ) 50 MG tablet, TAKE 1 TABLET BY MOUTH EVERY DAY, Disp: 90 tablet, Rfl: 2   Multiple Vitamins-Minerals (MULTIVITAMIN WITH MINERALS) tablet, Take by mouth., Disp: , Rfl:    TURMERIC PO, Take by mouth daily., Disp: , Rfl:    vitamin B-12 (CYANOCOBALAMIN) 1000 MCG tablet, Take by mouth daily., Disp: , Rfl: :  :   Allergies  Allergen Reactions   Sulfa Antibiotics Rash  :   Family History  Problem Relation Age of Onset   Diabetes Mother    Cancer Mother        multiple myloma   Heart disease Father    Diabetes Brother    Diabetes Brother    Diabetes Brother    Diabetes Brother    Diabetes Brother    :   Social History   Socioeconomic History   Marital status: Divorced    Spouse name: Not on file   Number of children: Not on file   Years of education: Not on file   Highest education level: Not on file  Occupational History   Not on file  Tobacco Use   Smoking status: Never   Smokeless tobacco: Never  Vaping Use   Vaping status: Never Used  Substance and Sexual Activity   Alcohol use: Yes    Comment: rarely   Drug use: No   Sexual activity: Not Currently    Birth control/protection: Surgical  Other Topics Concern   Not on file  Social History Narrative   Not on file   Social Drivers of Health   Financial Resource Strain: Low Risk  (07/17/2021)   Overall Financial Resource Strain (CARDIA)    Difficulty of Paying Living Expenses: Not hard at all  Food Insecurity: No Food Insecurity (07/17/2021)   Hunger Vital Sign    Worried About Running Out of Food in the Last Year: Never true    Ran Out of Food in the Last Year: Never true  Transportation Needs: No Transportation Needs (07/17/2021)   PRAPARE - Administrator, Civil Service (Medical): No    Lack of Transportation (Non-Medical): No  Physical Activity: Insufficiently Active (07/17/2021)   Exercise Vital Sign    Days of Exercise per Week: 4 days    Minutes of Exercise per Session: 20 min  Stress: No Stress Concern Present (07/17/2021)   [employer] of Occupational Health - Occupational Stress Questionnaire    Feeling of Stress : Only a little  Social Connections: Unknown (09/06/2023)   Social Connection and Isolation Panel [NHANES]    Frequency of Communication with Friends and Family: More than three times a week    Frequency of Social Gatherings with Friends and Family: Three times a week    Attends Religious Services: More than 4 times per year    Active Member of Clubs or Organizations: No    Attends Banker Meetings: Never    Marital Status: Not on file  Intimate Partner Violence: Not  At Risk (09/06/2023)   Humiliation, Afraid, Rape, and Kick questionnaire    Fear of Current or Ex-Partner: No    Emotionally Abused: No    Physically Abused: No    Sexually Abused: No  :  Review of Systems  Constitutional: Negative.  HENT: Negative.    Eyes: Negative.   Respiratory: Negative.    Cardiovascular: Negative.   Gastrointestinal: Negative.   Genitourinary: Negative.   Musculoskeletal: Negative.   Skin: Negative.   Neurological: Negative.   Endo/Heme/Allergies: Negative.   Psychiatric/Behavioral: Negative.       Exam:  Vital signs show temperature of 98.9.  Pulse 70.  Blood pressure 146/76.  Weight is 185 pounds.  @IPVITALS @ Physical Exam Vitals reviewed.  Constitutional:      Comments: On her breast exam, left breast shows no masses, edema or erythema.  There is no left axillary adenopathy.  Her right breast does show the biopsy scar.  She does have some firmness in the right breast at about the 3 o'clock position.  This is nontender.  I really cannot palpate any masses in the right axilla.  HENT:     Head: Normocephalic and atraumatic.  Eyes:     Pupils: Pupils are equal, round, and reactive to light.  Cardiovascular:     Rate and Rhythm: Normal rate and regular rhythm.     Heart sounds: Normal heart sounds.  Pulmonary:     Effort: Pulmonary effort is normal.     Breath sounds: Normal breath sounds.  Abdominal:     General: Bowel sounds are normal.     Palpations: Abdomen is soft.  Musculoskeletal:        General: No tenderness or deformity. Normal range of motion.     Cervical back: Normal range of motion.  Lymphadenopathy:     Cervical: No cervical adenopathy.  Skin:    General: Skin is warm and dry.     Findings: No erythema or rash.  Neurological:     Mental Status: She is alert and oriented to person, place, and time.  Psychiatric:        Behavior: Behavior normal.        Thought Content: Thought content normal.        Judgment: Judgment  normal.    Recent Labs    09/06/23 1052  WBC 4.5  HGB 13.5  HCT 39.7  PLT 180    Recent Labs    09/06/23 1052  NA 141  K 3.8  CL 105  CO2 29  GLUCOSE 105*  BUN 13  CREATININE 0.83  CALCIUM 9.1    Blood smear review: None  Pathology: See above    Assessment and Plan: [name] is a very charming 73 year old postmenopausal white female.  She has an invasive ductal carcinoma of the right breast.  This is at least stage II clinically.  I really think we are going to have to evaluate her to make sure there is no evidence of metastatic disease.  I would like to get a PET scan to make sure there is no evidence of metastatic disease.  Of note, her CA 27.29 is normal at 23.  I also think that a MRI of the right breast would certainly help us  out.  This would certainly help the surgeons out.  I do think she would be a good candidate for neoadjuvant therapy.  I think that she would be a good candidate for neoadjuvant chemotherapy.  I think that we should be able to get some good tumor shrinkage.  We will have to make sure that there is no evidence of metastatic disease.  As far as neoadjuvant therapy, I was like Taxotere/Cytoxan.  I think this would be a reasonable protocol for her.  She has a decent performance  status.  Again, I had to believe that this is going to be a stage II malignancy.  He could certainly be stage III depending on what we find with our staging studies.  She is very nice.  She comes in with her son.  I answered all of their questions.  We will plan for our radiographic studies hopefully to be done in a week or so.  Once I get these back, then we will have her return so we can make final recommendations for therapy.

## 2023-09-10 ENCOUNTER — Telehealth: Payer: Self-pay | Admitting: Licensed Clinical Social Worker

## 2023-09-10 ENCOUNTER — Other Ambulatory Visit: Payer: Self-pay

## 2023-09-10 ENCOUNTER — Encounter: Payer: Self-pay | Admitting: *Deleted

## 2023-09-10 DIAGNOSIS — Z170 Estrogen receptor positive status [ER+]: Secondary | ICD-10-CM

## 2023-09-10 NOTE — Progress Notes (Signed)
 Patient will need the following to complete staging and workup.   Referral to [facility]. Dr Maria Shiner would like the patient seen by Dr Delane Fear. Order placed. Records routed.   PET scheduled for 09/11/2023. Patient is aware of PET appointment including date, time, and location. The following prep is reviewed with patient and confirmed with teachback: - arrive 30 minutes before appointment time - NPO except water for 6h before scan. No candy, no gum - hold any diabetic medication the morning of the scan - have a low carb dinner the night prior  MRI : [facility] will reach out to schedule. They confirmed receipt of order.   Per Dr Maria Shiner, request for oncotype recurrence score testing sent on specimen [id number] DOS 08/22/2023. Request for testing to be completed on specimen 1 and 3.   Reached out to genetics department since patient likely has two primaries. She does qualify, however after discussion with the patient, this will be deferred at this time.   Oncology Nurse Navigator Documentation     09/10/2023    8:30 AM  Oncology Nurse Navigator Flowsheets  Navigator Follow Up Date: 09/11/2023  Navigator Follow Up Reason: Scan Review  Navigator Location [facility]  Navigator Encounter Type Appt/Treatment Plan Review;Telephone  Patient Visit Type MedOnc  Treatment Phase Pre-Tx/Tx Discussion  Barriers/Navigation Needs Coordination of Care;Education  Education Newly Diagnosed Cancer Education;Preparing for Upcoming Surgery/ Treatment  Interventions Coordination of Care;Education;Referrals  Acuity Level 2-Minimal Needs (1-2 Barriers Identified)  Referrals Other  Coordination of Care Radiology  Education Method Verbal;Written  Support Groups/Services Friends and Family  Time Spent with Patient 45  Genetic Counseling Type None

## 2023-09-10 NOTE — Progress Notes (Signed)
 Call from [facility] stating they need a new order for pt for MRI breast bilateral instead of right. Okay per MD. Order placed.

## 2023-09-10 NOTE — Telephone Encounter (Signed)
 [facility] Clinical Social Work  Clinical Social Work was referred by Statistician for assessment of psychosocial needs for new patient.  Clinical Social Worker contacted patient by phone to offer support and assess for needs.    Patient unable to speak in depth for psychosocial assessment as she was in the middle of work. Pt agreed to have L Duffy call when she returns next week.     Hailyn Zarr E Rosana Farnell, LCSW  Clinical Social Worker [facility]

## 2023-09-11 ENCOUNTER — Ambulatory Visit: Payer: Self-pay | Admitting: Hematology & Oncology

## 2023-09-11 ENCOUNTER — Encounter (HOSPITAL_COMMUNITY)
Admission: RE | Admit: 2023-09-11 | Discharge: 2023-09-11 | Disposition: A | Discharge status: Home / Self Care | Source: Ambulatory Visit | Attending: Hematology & Oncology | Admitting: Hematology & Oncology

## 2023-09-11 DIAGNOSIS — C50911 Malignant neoplasm of unspecified site of right female breast: Secondary | ICD-10-CM | POA: Diagnosis not present

## 2023-09-11 DIAGNOSIS — Z170 Estrogen receptor positive status [ER+]: Secondary | ICD-10-CM | POA: Diagnosis not present

## 2023-09-11 DIAGNOSIS — C50011 Malignant neoplasm of nipple and areola, right female breast: Secondary | ICD-10-CM | POA: Insufficient documentation

## 2023-09-11 LAB — GLUCOSE, CAPILLARY: Glucose-Capillary: 101 mg/dL — ABNORMAL HIGH (ref 70–99)

## 2023-09-11 MED ORDER — FLUDEOXYGLUCOSE F - 18 (FDG) INJECTION
9.2500 | Freq: Once | INTRAVENOUS | Status: AC
  Administered 2023-09-11: 9.24 via INTRAVENOUS

## 2023-09-12 ENCOUNTER — Inpatient Hospital Stay: Admitting: Dietician

## 2023-09-12 ENCOUNTER — Encounter: Payer: Self-pay | Admitting: *Deleted

## 2023-09-12 NOTE — Progress Notes (Signed)
 Call - the PET scan only shows that there is cancer in the right breast and right axilla.  We will have to see what the MRI shows.   Called patient and notified her of results. She has an appointment with Dr Delane Fear on Monday. She has not yet scheduled her MRI as there was some concern with a stent she had in place. She believes this issue is resolved and just needs to schedule. Message sent to GI scheduling to follow up with patient.   Oncology Nurse Navigator Documentation     09/12/2023    8:30 AM  Oncology Nurse Navigator Flowsheets  Navigator Follow Up Date: 09/16/2023  Navigator Follow Up Reason: Review Note  Navigator Location [facility]  Navigator Encounter Type Telephone  Telephone Diagnostic Results;Outgoing Call  Patient Visit Type MedOnc  Treatment Phase Pre-Tx/Tx Discussion  Barriers/Navigation Needs Coordination of Care;Education  Education Other  Interventions Education;Psycho-Social Support  Acuity Level 2-Minimal Needs (1-2 Barriers Identified)  Education Method Verbal  Support Groups/Services Friends and Family  Time Spent with Patient 15

## 2023-09-13 ENCOUNTER — Ambulatory Visit: Payer: Self-pay | Admitting: Hematology & Oncology

## 2023-09-13 ENCOUNTER — Ambulatory Visit
Admission: RE | Admit: 2023-09-13 | Discharge: 2023-09-13 | Disposition: A | Discharge status: Home / Self Care | Source: Ambulatory Visit | Attending: Hematology & Oncology | Admitting: Hematology & Oncology

## 2023-09-13 ENCOUNTER — Ambulatory Visit: Payer: Self-pay | Admitting: Family Medicine

## 2023-09-13 DIAGNOSIS — C50811 Malignant neoplasm of overlapping sites of right female breast: Secondary | ICD-10-CM | POA: Diagnosis not present

## 2023-09-13 DIAGNOSIS — C773 Secondary and unspecified malignant neoplasm of axilla and upper limb lymph nodes: Secondary | ICD-10-CM | POA: Diagnosis not present

## 2023-09-13 DIAGNOSIS — C50011 Malignant neoplasm of nipple and areola, right female breast: Secondary | ICD-10-CM

## 2023-09-13 MED ORDER — GADOPICLENOL 0.5 MMOL/ML IV SOLN
8.0000 mL | Freq: Once | INTRAVENOUS | Status: AC | PRN
  Administered 2023-09-13: 8 mL via INTRAVENOUS

## 2023-09-16 ENCOUNTER — Encounter: Payer: Self-pay | Admitting: *Deleted

## 2023-09-16 ENCOUNTER — Inpatient Hospital Stay: Attending: Hematology & Oncology

## 2023-09-16 DIAGNOSIS — C50811 Malignant neoplasm of overlapping sites of right female breast: Secondary | ICD-10-CM | POA: Insufficient documentation

## 2023-09-16 DIAGNOSIS — Z1732 Human epidermal growth factor receptor 2 negative status: Secondary | ICD-10-CM | POA: Insufficient documentation

## 2023-09-16 DIAGNOSIS — C773 Secondary and unspecified malignant neoplasm of axilla and upper limb lymph nodes: Secondary | ICD-10-CM | POA: Insufficient documentation

## 2023-09-16 DIAGNOSIS — Z170 Estrogen receptor positive status [ER+]: Secondary | ICD-10-CM | POA: Insufficient documentation

## 2023-09-16 NOTE — Progress Notes (Signed)
 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   Called patient and notified her of MRI results. She saw Dr Delane Fear this morning and he stated the following:  I think pushing limit with size and location to do a lumpectomy at this point. I am not sure that she 100% is going to need chemotherapy either given the characteristics of her tumor. I discussed with her potentially doing an Oncotype on her core biopsy. I do not think she is going to have a great response to chemotherapy in terms of shrinking this either. We also discussed antiestrogen therapy as primary therapy for about 6 months. I will need to discuss with oncology and we will get back to her.   Will wait for Delane Fear and Ennever to discuss patient's care before moving forward.   Oncology Nurse Navigator Documentation     09/16/2023   10:30 AM  Oncology Nurse Navigator Flowsheets  Navigator Follow Up Date: 09/20/2023  Navigator Follow Up Reason: Appointment Review  Navigator Location [facility]  Navigator Encounter Type Scan Review;Appt/Treatment Plan Review;Telephone  Telephone Diagnostic Results;Outgoing Call  Patient Visit Type MedOnc  Treatment Phase Pre-Tx/Tx Discussion  Barriers/Navigation Needs Coordination of Care;Education  Education Other  Interventions Education;Psycho-Social Support  Acuity Level 2-Minimal Needs (1-2 Barriers Identified)  Coordination of Care Other  Education Method Verbal  Support Groups/Services Friends and Family  Time Spent with Patient 15

## 2023-09-16 NOTE — Progress Notes (Signed)
 [facility] CSW Progress Note  Clinical Child psychotherapist contacted patient by phone to follow up after [name], LCSW, had contacted her for new patient referral.  [name] was at work and unable to talk.  It is planned for CSW to call patient Wednesday, 6/4, in the a.m. to conduct initial psychosocial assessment.    Kennth Peal, LCSW Clinical Social Worker [facility]

## 2023-09-18 ENCOUNTER — Ambulatory Visit: Attending: General Surgery

## 2023-09-18 ENCOUNTER — Inpatient Hospital Stay: Admitting: Licensed Clinical Social Worker

## 2023-09-18 ENCOUNTER — Other Ambulatory Visit: Payer: Self-pay

## 2023-09-18 DIAGNOSIS — Z170 Estrogen receptor positive status [ER+]: Secondary | ICD-10-CM | POA: Insufficient documentation

## 2023-09-18 DIAGNOSIS — C50911 Malignant neoplasm of unspecified site of right female breast: Secondary | ICD-10-CM | POA: Insufficient documentation

## 2023-09-18 DIAGNOSIS — R293 Abnormal posture: Secondary | ICD-10-CM | POA: Insufficient documentation

## 2023-09-18 NOTE — Therapy (Signed)
 OUTPATIENT PHYSICAL THERAPY BREAST CANCER BASELINE EVALUATION   Patient Name: [name] MRN: [id number] DOB:July 12, 1950, 73 y.o., female Today's Date: 09/18/2023  END OF SESSION:  PT End of Session - 09/18/23 1558     Visit Number 1    Number of Visits 2    Date for PT Re-Evaluation 12/11/23    Authorization Type [employer]    PT Start Time 1600    PT Stop Time 1655    PT Time Calculation (min) 55 min    Activity Tolerance Patient tolerated treatment well    Behavior During Therapy [facility] for tasks assessed/performed             Past Medical History:  Diagnosis Date   History of DVT of lower extremity 2007,2009   left   Hypertension    Skin cancer    skin cancers removed   Past Surgical History:  Procedure Laterality Date   ABDOMINAL HYSTERECTOMY  2007   BREAST BIOPSY Right 08/22/2023   US  RT BREAST BX W LOC DEV 1ST LESION IMG BX SPEC US  GUIDE 08/22/2023 [facility]   BREAST BIOPSY Left 08/23/2023   US  LT BREAST BX W LOC DEV 1ST LESION IMG BX SPEC US  GUIDE 08/23/2023 [facility]   BREAST LUMPECTOMY WITH RADIOACTIVE SEED LOCALIZATION Right 02/18/2014   Procedure: RIGHT BREAST DUCT EXCISION AND RADIOACTIVE SEED LOCALIZED BREAST LUMPECTOMY;  Surgeon: Adalberto Hollow, MD;  Location: [facility];  Service: General;  Laterality: Right;   BUNIONECTOMY     left   DILATION AND CURETTAGE OF UTERUS     DVT  2009   thrombectomy lt ll   right shoulder  2010   RCR-shoulder   THROMBECTOMY  2009   left common iliac vein-stent   TUBAL LIGATION     Patient Active Problem List   Diagnosis Date Noted   Bilateral hip pain 08/08/2022   Depression, major, recurrent, in partial remission (HCC) 12/05/2021   Essential hypertension, benign 10/27/2015   GERD (gastroesophageal reflux disease) 10/27/2015   Varicose veins of both lower extremities 10/27/2015   Breast discharge 12/29/2013    PCP:   REFERRING PROVIDER: Enid Harry,  MD  REFERRING DIAG: Right Breast Cancer  THERAPY DIAG:  Malignant neoplasm of right breast in female, estrogen receptor positive, unspecified site of breast (HCC)  Abnormal posture  Rationale for Evaluation and Treatment: Rehabilitation  ONSET DATE: 08/26/2023  SUBJECTIVE:  SUBJECTIVE STATEMENT: Patient reports she is here today to be seen by her medical team for her newly diagnosed right breast cancer.   PERTINENT HISTORY:  Patient was diagnosed on 08/26/2023 with right grade 2 Invasive DuctalCarcinoma. It measures  2.3 x 2.3 x 3cm cm . It is ER+, Pr+, and Her 2 - with a Ki67 of 15%.  It is undetermined at this time what her surgery/treatment will be. Pt prefers a lumpectomy if possible.  PATIENT GOALS:   reduce lymphedema risk and learn post op HEP.   PAIN:  Are you having pain? Yes: NPRS scale: 0/10-2/10 at worst Pain location: under arm, breast Pain description: sharp at times, Aggravating factors: just happens Relieving factors: moving arm  PRECAUTIONS: Active CA   RED FLAGS: None   HAND DOMINANCE: right  WEIGHT BEARING RESTRICTIONS: No  FALLS:  Has patient fallen in last 6 months? 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, puzzles  PRIOR LEVEL OF FUNCTION: Independent   OBJECTIVE: Note: Objective measures were completed at Evaluation unless otherwise noted.  COGNITION: Overall cognitive status: Within functional limits for tasks assessed    POSTURE:  Forward head and rounded shoulders posture  UPPER EXTREMITY AROM/PROM:  A/PROM RIGHT   eval   Shoulder extension 64  Shoulder flexion 150  Shoulder abduction 180  Shoulder internal  rotation 62  Shoulder external rotation 106    (Blank rows = not tested)  A/PROM LEFT   eval  Shoulder extension 60  Shoulder flexion 147  Shoulder abduction 170  Shoulder internal rotation 60  Shoulder external rotation 105    (Blank rows = not tested)  CERVICAL AROM: All within normal limits:    Percent limited  Flexion   Extension   Right lateral flexion   Left lateral flexion   Right rotation   Left rotation     UPPER EXTREMITY STRENGTH: WNL  LYMPHEDEMA ASSESSMENTS (in cm):   LANDMARK RIGHT   eval  10 cm proximal to olecranon process 30.4  Olecranon process 26.2  10 cm proximal to ulnar styloid process 22.4  Just proximal to ulnar styloid process 16.8  Across hand at thumb web space 19.2  At base of 2nd digit 6.2  (Blank rows = not tested)  LANDMARK LEFT   eval  10 cm proximal to olecranon process 33.1  Olecranon process 25.4  10 cm proximal to ulnar styloid process 21.4  Just proximal to ulnar styloid process 16.0  Across hand at thumb web space 18.45  At base of 2nd digit 5.9  (Blank rows = not tested)  L-DEX LYMPHEDEMA SCREENING:  The patient was assessed using the L-Dex machine today to produce a lymphedema index baseline score. The patient will be reassessed on a regular basis (typically every 3 months) to obtain new L-Dex scores. If the score is > 6.5 points away from his/her baseline score indicating onset of subclinical lymphedema, it will be recommended to wear a compression garment for 4 weeks, 12 hours per day and then be reassessed. If the score continues to be > 6.5 points from baseline at reassessment, we will initiate lymphedema treatment. Assessing in this manner has a 95% rate of preventing clinically significant lymphedema.   L-DEX FLOWSHEETS - 09/18/23 1700       L-DEX LYMPHEDEMA SCREENING   Measurement Type Unilateral    L-DEX MEASUREMENT EXTREMITY Upper Extremity  POSITION  Standing    DOMINANT SIDE Right    At Risk Side Right     BASELINE SCORE (UNILATERAL) 5.4    Comment 182.6 lbs             QUICK DASH SURVEY: 9.09  PATIENT EDUCATION:  Education details: Time spent educating patient on aspects of self-care to maximize post op recovery. Patient was educated on where and how to get a post op compression bra to use to reduce post op edema. Patient was also educated on the use of SOZO screenings and surveillance principles for early identification of lymphedema onset. She was instructed to use the post op pillow in the axilla for pressure and pain relief. Patient educated on lymphedema risk reduction and post op shoulder/posture HEP. Person educated: Patient Education method: Explanation, Demonstration, Handout Education comprehension: Patient verbalized understanding and returned demonstration  HOME EXERCISE PROGRAM: Patient was instructed today in a home exercise program today for post op shoulder range of motion. These included active assist shoulder flexion in sitting, scapular retraction, wall walking with shoulder abduction, and hands behind head external rotation.  She was encouraged to do these twice a day, holding 3 seconds and repeating 5 times when permitted by her physician.   ASSESSMENT:  CLINICAL IMPRESSION: Pts. multidisciplinary medical team met prior to her assessments to determine a recommended treatment plan. As of today she is not sure what her treatment plan is. Her surgeon wants to speak with oncology first.to  She will benefit from a post op PT reassessment to determine needs and from L-Dex screens every 3 months for 2 years to detect subclinical lymphedema.  Pt will benefit from skilled therapeutic intervention to improve on the following deficits: Decreased knowledge of precautions, impaired UE functional use, pain, decreased ROM, postural dysfunction.   PT treatment/interventions: ADL/self-care home management, pt/family education, therapeutic exercise  REHAB POTENTIAL:  Excellent  CLINICAL DECISION MAKING: Stable/uncomplicated  EVALUATION COMPLEXITY: Low   GOALS: Goals reviewed with patient? YES  LONG TERM GOALS: (STG=LTG)    Name Target Date Goal status  1 Pt will be able to verbalize understanding of pertinent lymphedema risk reduction practices relevant to her dx specifically related to skin care.  Baseline:  No knowledge 09/18/2023 Achieved at eval  2 Pt will be able to return demo and/or verbalize understanding of the post op HEP related to regaining shoulder ROM. Baseline:  No knowledge 09/18/2023 Achieved at eval  3 Pt will be able to verbalize understanding of the importance of viewing the post op After Breast CA Class video for further lymphedema risk reduction education and therapeutic exercise.  Baseline:  No knowledge 09/18/2023 Achieved at eval  4 Pt will demo she has regained full shoulder ROM and function post operatively compared to baselines.  Baseline: See objective measurements taken today. 12/11/2023 NEW    PLAN:  PT FREQUENCY/DURATION: EVAL and 1 follow up appointment.   PLAN FOR NEXT SESSION: will reassess 3-4 weeks post op to determine needs.Pt unsure of treatment plan presently   Patient will follow up at outpatient cancer rehab 3-4 weeks following surgery.  If the patient requires physical therapy at that time, a specific plan will be dictated and sent to the referring physician for approval. The patient was educated today on appropriate basic range of motion exercises to begin post operatively and the importance of viewing the After Breast Cancer class video following surgery.  Patient was educated today on lymphedema risk reduction practices as it pertains to recommendations that  will benefit the patient immediately following surgery.  She verbalized good understanding.    Physical Therapy Information for After Breast Cancer Surgery/Treatment:  Lymphedema is a swelling condition that you may be at risk for in your arm if you  have lymph nodes removed from the armpit area.  After a sentinel node biopsy, the risk is approximately 5-9% and is higher after an axillary node dissection.  There is treatment available for this condition and it is not life-threatening.  Contact your physician or physical therapist with concerns. You may begin the 4 shoulder/posture exercises (see additional sheet) when permitted by your physician (typically a week after surgery).  If you have drains, you may need to wait until those are removed before beginning range of motion exercises.  A general recommendation is to not lift your arms above shoulder height until drains are removed.  These exercises should be done to your tolerance and gently.  This is not a "no pain/no gain" type of recovery so listen to your body and stretch into the range of motion that you can tolerate, stopping if you have pain.  If you are having immediate reconstruction, ask your plastic surgeon about doing exercises as he or she may want you to wait. We encourage you to view the After Breast Cancer class video following surgery.  You will learn information related to lymphedema risk, prevention and treatment and additional exercises to regain mobility following surgery.   While undergoing any medical procedure or treatment, try to avoid blood pressure being taken or needle sticks from occurring on the arm on the side of cancer.   This recommendation begins after surgery and continues for the rest of your life.  This may help reduce your risk of getting lymphedema (swelling in your arm). An excellent resource for those seeking information on lymphedema is the National Lymphedema Network's web site. It can be accessed at www.lymphnet.org If you notice swelling in your hand, arm or breast at any time following surgery (even if it is many years from now), please contact your doctor or physical therapist to discuss this.  Lymphedema can be treated at any time but it is easier for you if  it is treated early on.  If you feel like your shoulder motion is not returning to normal in a reasonable amount of time, please contact your surgeon or physical therapist.  [facility] [phone]. [address], [place] [address]  [facility] CLASS After Breast Cancer Class  After Breast Cancer Class is a specially designed exercise class video to assist you in a safe recover after having breast cancer surgery.  In this video you will learn how to get back to full function whether your drains were just removed or if you had surgery a month ago. The video can be viewed on this page: https://www.boyd-meyer.org/ or on YouTube here: https://[email or web address].  Class Goals  Understand specific stretches to improve the flexibility of you chest and shoulder. Learn ways to safely strengthen your upper body and improve your posture. Understand the warning signs of infection and why you may be at risk for an arm infection. Learn about Lymphedema and prevention.  ** You do not need to view this video until after surgery.  Drains should be removed to participate in the recommended exercises on the video.  Patient was instructed today in a home exercise program today for post op shoulder range of motion. These included active assist shoulder flexion in sitting, scapular  retraction, wall walking with shoulder abduction, and hands behind head external rotation.  She was encouraged to do these twice a day, holding 3 seconds and repeating 5 times when permitted by her physician.    Latisha Poland, PT 09/18/2023, 5:08 PM

## 2023-09-18 NOTE — Progress Notes (Signed)
 [facility] Clinical Social Work  Initial Assessment   Juri Dinning is a 73 y.o. year old female contacted by phone. Clinical Social Work was referred by nurse navigator for assessment of psychosocial needs.   SDOH (Social Determinants of Health) assessments performed: Yes SDOH Interventions    Flowsheet Row Office Visit from 09/06/2023 in [facility] [place] Office Visit from 12/05/2021 in [facility] [place]  SDOH Interventions    Housing Interventions Intervention Not Indicated --  Utilities Interventions Intervention Not Indicated --  Alcohol Usage Interventions Intervention Not Indicated (Score <7) --  Depression Interventions/Treatment  -- Medication  Social Connections Interventions Intervention Not Indicated --  Health Literacy Interventions Intervention Not Indicated --       SDOH Screenings   Food Insecurity: No Food Insecurity (07/17/2021)  Housing: Unknown (09/16/2023)   Received from [facility]  Transportation Needs: No Transportation Needs (07/17/2021)  Utilities: Not At Risk (09/06/2023)  Alcohol Screen: Low Risk  (09/06/2023)  Depression (PHQ2-9): Low Risk  (07/02/2023)  Financial Resource Strain: Low Risk  (07/17/2021)  Physical Activity: Insufficiently Active (07/17/2021)  Social Connections: Unknown (09/06/2023)  Stress: No Stress Concern Present (07/17/2021)  Tobacco Use: Low Risk  (09/16/2023)   Received from [facility]  Health Literacy: Adequate Health Literacy (09/06/2023)     Distress Screen completed: No     No data to display            Family/Social Information:  Housing Arrangement: patient lives alone Family members/support persons in your life? Family, Friends, and [employer].  Patient has two adult sons that live locally and two adult sons that live in [place].  They are all very supportive. 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  Income source: Employment Financial concerns: No Type of concern: None Food access concerns: no Religious or spiritual practice: Yes-Patient attends church and socializes with church members. Advanced directives: No Services Currently in place:  Medicare  Coping/ Adjustment to diagnosis: Patient understands treatment plan and what happens next? yes Concerns about diagnosis and/or treatment: I'm not especially worried about anything Patient reported stressors: Patient denied stressors. Hopes and/or priorities: Hoping not to have chemo therapy. Patient enjoys being outside and time with family/ friends Current coping skills/ strengths: Average or above average intelligence , Capable of independent living , Communication skills , Financial means , General fund of knowledge , Motivation for treatment/growth , Religious Affiliation , and Supportive family/friends     SUMMARY: Current SDOH Barriers:  None per patient.  Clinical Social Work Clinical Goal(s):  No clinical social work goals at this time  Interventions: Discussed common feeling and emotions when being diagnosed with cancer, and the importance of support during treatment Informed patient of the support team roles and support services at [facility] Provided CSW contact information and encouraged patient to call with any questions or concerns Provided patient with information about CSW role.  Statistician and massage certificates.   Follow Up Plan: CSW will follow-up with patient by phone  Patient verbalizes understanding of plan: Yes    Kennth Peal, LCSW Clinical Social Worker [facility]

## 2023-09-19 ENCOUNTER — Encounter: Payer: Self-pay | Admitting: *Deleted

## 2023-09-19 ENCOUNTER — Inpatient Hospital Stay: Admitting: Dietician

## 2023-09-19 NOTE — Progress Notes (Signed)
 Patient called this morning asking for update on desired treatment plan. Spoke to Dr Maria Shiner and he and Dr Delane Fear would like patient to have endocrine therapy prior to surgery.   Patient notified of plan and desire for f/u appointment next week to discuss and prescribe. She is in agreement. Scheduling message sent.   Oncology Nurse Navigator Documentation     09/19/2023    8:45 AM  Oncology Nurse Navigator Flowsheets  Navigator Follow Up Date: 09/25/2023  Navigator Follow Up Reason: Follow-up Appointment  Navigator Location [facility]  Navigator Encounter Type Telephone  Telephone Incoming Call  Patient Visit Type MedOnc  Treatment Phase Pre-Tx/Tx Discussion  Barriers/Navigation Needs Coordination of Care;Education  Education Other  Interventions Coordination of Care;Education  Acuity Level 2-Minimal Needs (1-2 Barriers Identified)  Coordination of Care Appts  Education Method Verbal  Time Spent with Patient 15

## 2023-09-19 NOTE — Progress Notes (Signed)
 Nutrition Assessment: Reached out to patient at home telephone number.    Reason for Assessment: New Patient Assessment   ASSESSMENT: Patient is a 73 year old female with R Breast Mammary Cancer. ER/PR+ HER2- positive lymph nodes. She is followed by Dr. Maria Shiner and plan is for endocrine therapy prior to surgery.  Patient denies NIS at this time other than GERD only with fried foods and has had mouth sores in past which may be r/t bread and butter pickles. No food allergies. Tries to eat fruits and vegetables.  Trying to cut down on sodas but now hungrier in morning. Eats restaurants take out a lot. Lives alone doesn't cook much. Some favorite foods: Pizza with salad Tuna salad sandwich with chips Ice cream every once in a awhile, likes nuts, likes cheese Fluids: Cut down to 12oz can soda now, Flavored water to entice to drink more, Decaf tea with regular sugar, rare to have alcohol   Nutrition Focused Physical Exam: unable to perform NFPE   Medications: reviewed supplement list all current   Labs: reviewed 09/06/23   Anthropometrics: no unintentional weight loss  Height: 67" Weight: 185# BMI: 28.88   Estimated Energy Needs  Kcals: 2100-2500 Protein: 102-127 g Fluid: 2.5 L   NUTRITION DIAGNOSIS: Food and Nutrition Related Knowledge Deficit related to cancer and associated treatments as evidenced by no prior need for nutrition related information.   INTERVENTION:   Relayed that nutrition services are wrap around service provided at no charge and encouraged continued communication if experiencing any nutritional impact symptoms (NIS). Educated on importance of adequate nourishment with calorie and protein energy intake with nutrient dense foods when possible to maintain weight/strength and QOL.   Discussed strategies for calcium pacing throughout day. Contact information provided in text.  MONITORING, EVALUATION, GOAL: weight trends, nutrition impact symptoms, PO intake,  labs   Next Visit: PRN at patient or provider request.  Carleen Chary, RDN, LDN Registered Dietitian, [facility] Part Time Remote (Usual office hours: Tuesday-Thursday) Mobile: [phone]

## 2023-09-24 ENCOUNTER — Other Ambulatory Visit: Payer: Self-pay | Admitting: *Deleted

## 2023-09-24 DIAGNOSIS — Z170 Estrogen receptor positive status [ER+]: Secondary | ICD-10-CM | POA: Diagnosis not present

## 2023-09-24 DIAGNOSIS — C50919 Malignant neoplasm of unspecified site of unspecified female breast: Secondary | ICD-10-CM | POA: Diagnosis not present

## 2023-09-25 ENCOUNTER — Inpatient Hospital Stay

## 2023-09-25 ENCOUNTER — Inpatient Hospital Stay: Admitting: Hematology & Oncology

## 2023-09-25 ENCOUNTER — Encounter: Payer: Self-pay | Admitting: *Deleted

## 2023-09-25 ENCOUNTER — Encounter: Payer: Self-pay | Admitting: Hematology & Oncology

## 2023-09-25 ENCOUNTER — Ambulatory Visit

## 2023-09-25 VITALS — BP 147/72 | HR 74 | Temp 98.7°F | Resp 18 | Ht 67.0 in | Wt 183.1 lb

## 2023-09-25 DIAGNOSIS — C773 Secondary and unspecified malignant neoplasm of axilla and upper limb lymph nodes: Secondary | ICD-10-CM

## 2023-09-25 DIAGNOSIS — C50911 Malignant neoplasm of unspecified site of right female breast: Secondary | ICD-10-CM | POA: Insufficient documentation

## 2023-09-25 DIAGNOSIS — Z1732 Human epidermal growth factor receptor 2 negative status: Secondary | ICD-10-CM | POA: Diagnosis not present

## 2023-09-25 DIAGNOSIS — Z170 Estrogen receptor positive status [ER+]: Secondary | ICD-10-CM | POA: Diagnosis not present

## 2023-09-25 DIAGNOSIS — C50811 Malignant neoplasm of overlapping sites of right female breast: Secondary | ICD-10-CM | POA: Diagnosis not present

## 2023-09-25 DIAGNOSIS — C50011 Malignant neoplasm of nipple and areola, right female breast: Secondary | ICD-10-CM

## 2023-09-25 LAB — CMP (CANCER CENTER ONLY)
ALT: 11 U/L (ref 0–44)
AST: 15 U/L (ref 15–41)
Albumin: 4.2 g/dL (ref 3.5–5.0)
Alkaline Phosphatase: 52 U/L (ref 38–126)
Anion gap: 8 (ref 5–15)
BUN: 17 mg/dL (ref 8–23)
CO2: 29 mmol/L (ref 22–32)
Calcium: 9.4 mg/dL (ref 8.9–10.3)
Chloride: 106 mmol/L (ref 98–111)
Creatinine: 1.08 mg/dL — ABNORMAL HIGH (ref 0.44–1.00)
GFR, Estimated: 54 mL/min — ABNORMAL LOW (ref >60)
Glucose, Bld: 94 mg/dL (ref 70–99)
Potassium: 4.3 mmol/L (ref 3.5–5.1)
Sodium: 143 mmol/L (ref 135–145)
Total Bilirubin: 0.6 mg/dL (ref 0.0–1.2)
Total Protein: 6.8 g/dL (ref 6.5–8.1)

## 2023-09-25 LAB — CBC WITH DIFFERENTIAL (CANCER CENTER ONLY)
Abs Immature Granulocytes: 0.01 10*3/uL (ref 0.00–0.07)
Basophils Absolute: 0.1 10*3/uL (ref 0.0–0.1)
Basophils Relative: 1 %
Eosinophils Absolute: 0.1 10*3/uL (ref 0.0–0.5)
Eosinophils Relative: 2 %
HCT: 38.7 % (ref 36.0–46.0)
Hemoglobin: 13.1 g/dL (ref 12.0–15.0)
Immature Granulocytes: 0 %
Lymphocytes Relative: 40 %
Lymphs Abs: 2.2 10*3/uL (ref 0.7–4.0)
MCH: 32.2 pg (ref 26.0–34.0)
MCHC: 33.9 g/dL (ref 30.0–36.0)
MCV: 95.1 fL (ref 80.0–100.0)
Monocytes Absolute: 0.4 10*3/uL (ref 0.1–1.0)
Monocytes Relative: 8 %
Neutro Abs: 2.7 10*3/uL (ref 1.7–7.7)
Neutrophils Relative %: 49 %
Platelet Count: 189 10*3/uL (ref 150–400)
RBC: 4.07 MIL/uL (ref 3.87–5.11)
RDW: 12.1 % (ref 11.5–15.5)
WBC Count: 5.4 10*3/uL (ref 4.0–10.5)
nRBC: 0 % (ref 0.0–0.2)

## 2023-09-25 MED ORDER — LETROZOLE 2.5 MG PO TABS: 2.5000 mg | ORAL_TABLET | Freq: Every day | ORAL | 12 refills | Status: DC

## 2023-09-25 MED ORDER — ABEMACICLIB 150 MG PO TABS
150.0000 mg | ORAL_TABLET | Freq: Two times a day (BID) | ORAL | 12 refills | Status: AC
Start: 2023-09-25 — End: ?

## 2023-09-25 NOTE — Progress Notes (Signed)
 Received Oncotype Recurrence Score results. Score = 30. Report given to Dr Maria Shiner. Patient will be here this afternoon for treatment discussion.   Oncology Nurse Navigator Documentation     09/25/2023    8:00 AM  Oncology Nurse Navigator Flowsheets  Navigator Follow Up Date: 09/25/2023  Navigator Follow Up Reason: Follow-up Appointment  Navigator Location [facility]  Navigator Encounter Type Pathology Review  Patient Visit Type MedOnc  Treatment Phase Pre-Tx/Tx Discussion  Barriers/Navigation Needs Coordination of Care;Education  Interventions Coordination of Care  Acuity Level 2-Minimal Needs (1-2 Barriers Identified)  Coordination of Care Pathology  Time Spent with Patient 15

## 2023-09-25 NOTE — Progress Notes (Signed)
 Hematology and Oncology Follow Up Visit  [name] [id number] 1950-10-17 73 y.o. 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 is back for follow-up.  This is her second office visit.  We did do a workup on her.  She had a breast MRI.  This was done on 09/13/2023.  This did show the mass in the right breast measuring 2.5 x 2.4 x 3 cm.  However, the lymph node in the right axilla was not seen as it was high up.  She also had a PET scan.  This was done on 09/11/2023.  This did show the 2.3 cm hypermetabolic right breast mass.  This also showed 2 lymph nodes in the right axilla.  The surprise that we got was that we sent off the Oncotype score.  Shockingly, this was 30.  I know that this does put a little bit of a complication into our plans..  She saw Dr. Delane Fear of Surgical Oncology.  He felt that she would do well having neoadjuvant antiestrogen therapy to try to shrink the tumor down.  I had a very long talk with her.  I told her that the standard approach with an Oncotype score 30 is that patients will receive chemotherapy.  She is wondering if she would have a mastectomy now.  She had a mastectomy now, we will she need any radiation therapy.  I told her that she would not need any radiation therapy in my opinion.  However, I would worry that there is still be significant residual cancer.  She is worried about having to take radiation therapy.  I told her if she had a lumpectomy, she clearly would need to have radiation therapy.  I told her the Oncotype score and the risk of recurrence is.  I think adding chemotherapy to antiestrogen therapy, the risk of recurrence can get knocked down from 17% down to about 10%.  I told her that this is a significant decrease.  I also told her that we  now have an additional medication that we add to antiestrogen therapy.  I think in her case, using a CDK4/CDK6 inhibitor would be helpful.  I think it be reasonable to give her 3 months of therapy with combined antiestrogen treatment and then see how everything looks.  We can clearly see if she responds by the palpable mass at about the 2 o'clock position adjacent to the right areola.  I know this is a lot for her to think about.  Overall, I would have said that her performance status is still incredibly good at ECOG 0.  Medications:  Current Outpatient Medications:    abemaciclib (VERZENIO) 150 MG tablet, Take 1 tablet (150 mg total) by mouth 2 (two) times daily., Disp: 60 tablet, Rfl: 12   Calcium Carbonate-Vitamin D 600-200 MG-UNIT TABS, Take by mouth daily., Disp: , Rfl:    citalopram  (CELEXA ) 10 MG tablet, TAKE 1 TABLET BY MOUTH ONCE DAILY . APPOINTMENT REQUIRED FOR FUTURE REFILLS, Disp: 30 tablet, Rfl: 0   Collagen-Vitamin C-Biotin (COLLAGEN PO), Take by mouth daily., Disp: , Rfl:    ELDERBERRY PO, Take 1 tablet by mouth daily., Disp: , Rfl:    letrozole (FEMARA) 2.5 MG tablet, Take 1 tablet (2.5 mg total) by mouth daily., Disp: 30 tablet, Rfl: 12   losartan  (COZAAR ) 50 MG  tablet, TAKE 1 TABLET BY MOUTH EVERY DAY, Disp: 90 tablet, Rfl: 2   Multiple Vitamins-Minerals (MULTIVITAMIN WITH MINERALS) tablet, Take by mouth., Disp: , Rfl:    TURMERIC PO, Take by mouth daily., Disp: , Rfl:    vitamin B-12 (CYANOCOBALAMIN) 1000 MCG tablet, Take by mouth daily., Disp: , Rfl:   Allergies:  Allergies  Allergen Reactions   Sulfa Antibiotics Rash    Past Medical History, Surgical history, Social history, and Family History were reviewed and updated.  Review of Systems: Review of Systems  Constitutional: Negative.   HENT:  Negative.  Negative for hearing loss.   Eyes: Negative.   Respiratory: Negative.    Cardiovascular: Negative.   Gastrointestinal: Negative.   Endocrine: Negative.    Genitourinary: Negative.    Musculoskeletal: Negative.   Skin: Negative.   Neurological: Negative.   Hematological: Negative.   Psychiatric/Behavioral: Negative.      Physical Exam:  height is 5' 7 (1.702 m) and weight is 183 lb 1.9 oz (83.1 kg). Her oral temperature is 98.7 F (37.1 C). Her blood pressure is 147/72 (abnormal) and her pulse is 74. Her respiration is 18 and oxygen saturation is 98%.   Wt Readings from Last 3 Encounters:  09/25/23 183 lb 1.9 oz (83.1 kg)  09/06/23 185 lb (83.9 kg)  08/08/22 175 lb (79.4 kg)    Physical Exam Vitals reviewed.  Constitutional:      Comments: Breast exam shows left breast with no masses, edema or erythema.  There is no left axillary adenopathy.  Right breast does show the lump just adjacent to the areola at the 2 o'clock position.  This probably measures about 2-3 cm.  It is firm, it is slightly mobile.  There is no nipple inversion or nipple discharge.  I cannot palpate any right axillary adenopathy.  HENT:     Head: Normocephalic and atraumatic.  Eyes:     Pupils: Pupils are equal, round, and reactive to light.  Cardiovascular:     Rate and Rhythm: Normal rate and regular rhythm.     Heart sounds: Normal heart sounds.  Pulmonary:     Effort: Pulmonary effort is normal.     Breath sounds: Normal breath sounds.  Abdominal:     General: Bowel sounds are normal.     Palpations: Abdomen is soft.  Musculoskeletal:        General: No tenderness or deformity. Normal range of motion.     Cervical back: Normal range of motion.  Lymphadenopathy:     Cervical: No cervical adenopathy.  Skin:    General: Skin is warm and dry.     Findings: No erythema or rash.  Neurological:     Mental Status: She is alert and oriented to person, place, and time.  Psychiatric:        Behavior: Behavior normal.        Thought Content: Thought content normal.        Judgment: Judgment normal.      Lab Results  Component Value Date   WBC 5.4  09/25/2023   HGB 13.1 09/25/2023   HCT 38.7 09/25/2023   MCV 95.1 09/25/2023   PLT 189 09/25/2023     Chemistry      Component Value Date/Time   NA 143 09/25/2023 1350   NA 142 02/02/2015 0000   K 4.3 09/25/2023 1350   CL 106 09/25/2023 1350   CO2 29 09/25/2023 1350   BUN 17 09/25/2023 1350   BUN 13  02/02/2015 0000   CREATININE 1.08 (H) 09/25/2023 1350   GLU 101 02/02/2015 0000      Component Value Date/Time   CALCIUM 9.4 09/25/2023 1350   ALKPHOS 52 09/25/2023 1350   AST 15 09/25/2023 1350   ALT 11 09/25/2023 1350   BILITOT 0.6 09/25/2023 1350      Impression and Plan: Ms. Heckart is a very charming 73 year old postmenopausal white female.  Again, surprisingly, she does have a high Oncotype score.  I told her that she would benefit from chemotherapy after surgery regardless of whether she has a lumpectomy or a mastectomy.  Again, we could certainly do neoadjuvant antiestrogen therapy with Femara and Verzenio.  I think this would be a good combination for her.  I really think that this will work.  Again, she is not sure if she wants to have a mastectomy right now.  She is going to think about this.  She says she wants to talk with Dr. Delane Fear again.  I am sure that he would be more than happy to talk with her.  Again know this is somewhat complicated.  Having the high Oncotype score was unexpected.  She is in great shape.  I still feel that we need to be aggressive so that she has the best chance of having this breast cancer cured and not having to come back.  I would like to try to get her back to see us  probably in about a month or so.   Ivor Mars, MD 6/11/20253:30 PM

## 2023-09-26 ENCOUNTER — Telehealth: Payer: Self-pay | Admitting: Pharmacist

## 2023-09-26 ENCOUNTER — Telehealth: Payer: Self-pay | Admitting: Pharmacy Technician

## 2023-09-26 ENCOUNTER — Encounter: Payer: Self-pay | Admitting: *Deleted

## 2023-09-26 ENCOUNTER — Encounter: Payer: Self-pay | Admitting: Hematology & Oncology

## 2023-09-26 ENCOUNTER — Other Ambulatory Visit (HOSPITAL_COMMUNITY): Payer: Self-pay

## 2023-09-26 DIAGNOSIS — C50911 Malignant neoplasm of unspecified site of right female breast: Secondary | ICD-10-CM

## 2023-09-26 NOTE — Telephone Encounter (Signed)
 Oral Oncology Pharmacist Encounter  Received new prescription for Verzenio (abemaciclib) for the treatment of early stage HR positive, HER-2 breast cancer in conjunction with letrozole, planned duration per MD is 3 months in neo-adjuvant setting.  CBC w/ Diff and CMP from 09/25/23 assessed, no relevant lab abnormalities requiring baseline dose adjustment required at this time. Prescription dose and frequency assessed for appropriateness.  Current medication list in Epic reviewed, no relevant/significant DDIs with Verzenio identified.  Evaluated chart and no patient barriers to medication adherence noted.   Prescription has been e-scribed to the [facility] for benefits analysis and approval.  Oral Oncology Clinic will continue to follow for insurance authorization, copayment issues, initial counseling and start date.  Jude Norton, PharmD, BCPS, BCOP Hematology/Oncology Clinical Pharmacist [facility] and [facility] Oral Chemotherapy Navigation Clinics [phone] 09/26/2023 8:43 AM

## 2023-09-26 NOTE — Progress Notes (Signed)
 Initial patient plan for AI followed by surgery now in question due to unexpected high oncotype score of 20. Adjuvant chemo is now recommended, along with radiation if plan is for lumpectomy. Patient is understandably concerned with new information and would like to speak with Dr Delane Fear again. She is unsure about lumpectomy vs mastectomy, and is reluctant to have any radiation or adjuvant chemo.   She did agree to three months of neoadjuvant AI with the addition of Verzenio. Prescriptions sent to specialty pharmacy. After three months of therapy she would be re-scanned with plan to proceed to surgery.   Will call patient tomorrow to assess for additional needs after appointment today.   Oncology Nurse Navigator Documentation     09/25/2023    2:00 PM  Oncology Nurse Navigator Flowsheets  Planned Course of Treatment Neo AI  Phase of Treatment AI (Aromatase Inhibitors)  Navigator Follow Up Date: 09/27/2023  Navigator Follow Up Reason: Patient Call  Navigator Location [facility]  Navigator Encounter Type Follow-up Appt  Patient Visit Type MedOnc  Treatment Phase Pre-Tx/Tx Discussion  Barriers/Navigation Needs Coordination of Care;Education  Interventions Education;Psycho-Social Support  Acuity Level 2-Minimal Needs (1-2 Barriers Identified)  Education Method Verbal  Time Spent with Patient 15

## 2023-09-26 NOTE — Progress Notes (Signed)
 See MyChart communication dated 09/26/2023.  Oncology Nurse Navigator Documentation     09/26/2023   10:30 AM  Oncology Nurse Navigator Flowsheets  Planned Course of Treatment Neo AI  Phase of Treatment AI (Aromatase Inhibitors)  Aromatase Inhibitor Actual Start Date: 09/26/2023  Navigator Follow Up Date: 10/31/2023  Navigator Follow Up Reason: Follow-up Appointment  Navigator Location [facility] [facility]  Navigator Encounter Type MyChart  Treatment Initiated Date 09/26/2023  Patient Visit Type MedOnc  Treatment Phase Active Tx  Barriers/Navigation Needs Coordination of Care;Education  Education Other  Interventions Education  Acuity Level 2-Minimal Needs (1-2 Barriers Identified)  Education Method Written  Time Spent with Patient 15

## 2023-09-26 NOTE — Telephone Encounter (Signed)
 Oral Oncology Patient Advocate Encounter   Received notification that prior authorization for Verzenio is required.   PA submitted on 09/26/2023 Key [id number] Status is pending     Hermila Millis (Patty) Benjaman Branch, CPhT  [facility], [place], [place], [place] Oral Chemotherapy Patient Advocate Phone: [phone]  Fax: [phone]

## 2023-09-26 NOTE — Telephone Encounter (Signed)
 Oral Oncology Patient Advocate Encounter  Prior Authorization for Verzenio has been approved.    PA# [id number] Effective dates: 09/26/2023 through 09/25/2024  Patients co-pay is $1,923.37.    Ravleen Ries (Patty) Benjaman Branch, CPhT  [facility], [place], [place], [place] Oral Chemotherapy Patient Advocate Phone: [phone]  Fax: [phone]

## 2023-09-27 ENCOUNTER — Other Ambulatory Visit (HOSPITAL_COMMUNITY): Payer: Self-pay

## 2023-09-28 ENCOUNTER — Other Ambulatory Visit: Payer: Self-pay | Admitting: Family Medicine

## 2023-09-28 DIAGNOSIS — I10 Essential (primary) hypertension: Secondary | ICD-10-CM

## 2023-09-30 ENCOUNTER — Telehealth: Payer: Self-pay | Admitting: Family Medicine

## 2023-09-30 ENCOUNTER — Other Ambulatory Visit (HOSPITAL_COMMUNITY): Payer: Self-pay

## 2023-09-30 ENCOUNTER — Other Ambulatory Visit: Payer: Self-pay

## 2023-09-30 NOTE — Telephone Encounter (Signed)
 Noted

## 2023-09-30 NOTE — Telephone Encounter (Signed)
 Patient wants to change her pharmacy to [facility] [address] [place], [place], [place] [address]

## 2023-10-01 ENCOUNTER — Other Ambulatory Visit (HOSPITAL_COMMUNITY): Payer: Self-pay

## 2023-10-01 ENCOUNTER — Telehealth: Payer: Self-pay | Admitting: Pharmacy Technician

## 2023-10-01 ENCOUNTER — Other Ambulatory Visit: Payer: Self-pay | Admitting: Hematology & Oncology

## 2023-10-01 ENCOUNTER — Other Ambulatory Visit: Payer: Self-pay

## 2023-10-01 ENCOUNTER — Other Ambulatory Visit: Payer: Self-pay | Admitting: Pharmacy Technician

## 2023-10-01 MED ORDER — ABEMACICLIB 150 MG PO TABS
150.0000 mg | ORAL_TABLET | Freq: Two times a day (BID) | ORAL | 11 refills | Status: DC
  Filled 2023-10-01: qty 56, 28d supply, fill #0

## 2023-10-01 NOTE — Progress Notes (Signed)
 Specialty Pharmacy Initial Fill Coordination Note  [name] is a 73 y.o. female contacted today regarding refills of specialty medication(s) Abemaciclib  (VERZENIO ) .  Patient requested [name] at [facility] [place]  on 10/03/23   Medication will be filled on 06/18.   Patient is aware of $0 copayment.  Bill MPPP secondary.  Qualyn Oyervides (Patty) Benjaman Branch, CPhT  [facility], [place], [place], [place] Oral Chemotherapy Patient Advocate Phone: [phone]  Fax: [phone]

## 2023-10-01 NOTE — Telephone Encounter (Signed)
 Oral Chemotherapy Pharmacist Encounter  I spoke with patient for overview of: Verzenio  (abemaciclib ) for the treatment of early stage HR positive, HER-2 breast cancer in conjunction with letrozole , planned duration per MD is 3 months in neo-adjuvant setting.   Counseled patient on administration, dosing, side effects, monitoring, drug-food interactions, safe handling, storage, and disposal.  Patient will take Verzenio  150mg  tablets, 1 tablet by mouth twice daily without regard to food.  Patient knows to avoid grapefruit and grapefruit juice.  Verzenio  start date: 10/01/23 PM  Adverse effects include but are not limited to: diarrhea, fatigue, nausea, abdominal pain, decreased blood counts, and increased liver function tests, and joint pains. Diarrhea: Patient will obtain anti diarrheal and alert the office of 4 or more loose stools above baseline.  Reviewed with patient importance of keeping a medication schedule and plan for any missed doses. No barriers to medication adherence identified.  Medication reconciliation performed and medication/allergy list updated.  All questions answered.  [name] voiced understanding and appreciation.   Medication education handout placed in mail for patient. Patient knows to call the office with questions or concerns. Oral Chemotherapy Clinic phone number provided to patient.   Jude Norton, PharmD, BCPS, BCOP Hematology/Oncology Clinical Pharmacist [name] and [facility] Oral Chemotherapy Navigation Clinics [phone] 10/01/2023 2:02 PM

## 2023-10-01 NOTE — Telephone Encounter (Signed)
 Patient successfully OnBoarded and drug education provided by pharmacist. Medication scheduled to be filled on 06/18 for pick up on 06/19 from [facility]. Patient also knows to call me at [phone] with any questions or concerns regarding receiving medication or if there is any unexpected change in co-pay.   Rutledge Selsor (Patty) Benjaman Branch, CPhT  [facility], [place], [place], [place] Oral Chemotherapy Patient Advocate Phone: [phone]  Fax: [phone]

## 2023-10-01 NOTE — Progress Notes (Signed)
 Oral Chemotherapy Pharmacist Encounter  Patient was counseled under telephone encounter from 09/26/23.  Jude Norton, PharmD, BCPS, BCOP Hematology/Oncology Clinical Pharmacist [facility] and [facility] Oral Chemotherapy Navigation Clinics [phone] 10/01/2023 2:07 PM

## 2023-10-02 ENCOUNTER — Other Ambulatory Visit (HOSPITAL_COMMUNITY): Payer: Self-pay

## 2023-10-07 ENCOUNTER — Other Ambulatory Visit: Payer: Self-pay | Admitting: *Deleted

## 2023-10-07 ENCOUNTER — Encounter: Payer: Self-pay | Admitting: Hematology & Oncology

## 2023-10-07 MED ORDER — ONDANSETRON HCL 8 MG PO TABS: 8.0000 mg | ORAL_TABLET | Freq: Three times a day (TID) | ORAL | 2 refills | Status: DC | PRN

## 2023-10-08 ENCOUNTER — Other Ambulatory Visit: Payer: Self-pay | Admitting: Family Medicine

## 2023-10-08 DIAGNOSIS — F3341 Major depressive disorder, recurrent, in partial remission: Secondary | ICD-10-CM

## 2023-10-08 MED ORDER — CITALOPRAM HYDROBROMIDE 10 MG PO TABS: 10.0000 mg | ORAL_TABLET | Freq: Every day | ORAL | 0 refills | Status: DC

## 2023-10-08 NOTE — Telephone Encounter (Signed)
 Copied from CRM [id number]. Topic: Clinical - Medication Refill >> Oct 08, 2023  9:00 AM Shardie S wrote: Medication: citalopram  (CELEXA ) 10 MG tablet  Has the patient contacted their pharmacy? Yes (Agent: If no, request that the patient contact the pharmacy for the refill. If patient does not wish to contact the pharmacy document the reason why and proceed with request.) (Agent: If yes, when and what did the pharmacy advise?)  This is the patient's preferred pharmacy:  [facility] [address], [place] - [address] [place] [address] Phone: [phone] Fax: [phone]   Is this the correct pharmacy for this prescription? Yes If no, delete pharmacy and type the correct one.   Has the prescription been filled recently? No  Is the patient out of the medication? Yes  Has the patient been seen for an appointment in the last year OR does the patient have an upcoming appointment? No  Can we respond through MyChart? Yes  Agent: Please be advised that Rx refills may take up to 3 business days. We ask that you follow-up with your pharmacy.

## 2023-10-09 ENCOUNTER — Inpatient Hospital Stay: Admitting: Hematology & Oncology

## 2023-10-09 ENCOUNTER — Encounter: Payer: Self-pay | Admitting: Hematology & Oncology

## 2023-10-09 ENCOUNTER — Encounter: Payer: Self-pay | Admitting: *Deleted

## 2023-10-09 ENCOUNTER — Inpatient Hospital Stay

## 2023-10-09 VITALS — BP 127/67 | HR 84 | Temp 99.2°F | Resp 20 | Ht 67.0 in | Wt 175.0 lb

## 2023-10-09 DIAGNOSIS — C50011 Malignant neoplasm of nipple and areola, right female breast: Secondary | ICD-10-CM

## 2023-10-09 DIAGNOSIS — C773 Secondary and unspecified malignant neoplasm of axilla and upper limb lymph nodes: Secondary | ICD-10-CM

## 2023-10-09 DIAGNOSIS — C50811 Malignant neoplasm of overlapping sites of right female breast: Secondary | ICD-10-CM | POA: Diagnosis not present

## 2023-10-09 DIAGNOSIS — C50911 Malignant neoplasm of unspecified site of right female breast: Secondary | ICD-10-CM

## 2023-10-09 LAB — CBC WITH DIFFERENTIAL (CANCER CENTER ONLY)
Abs Immature Granulocytes: 0.01 10*3/uL (ref 0.00–0.07)
Basophils Absolute: 0.1 10*3/uL (ref 0.0–0.1)
Basophils Relative: 1 %
Eosinophils Absolute: 0 10*3/uL (ref 0.0–0.5)
Eosinophils Relative: 1 %
HCT: 40.8 % (ref 36.0–46.0)
Hemoglobin: 13.8 g/dL (ref 12.0–15.0)
Immature Granulocytes: 0 %
Lymphocytes Relative: 37 %
Lymphs Abs: 2.1 10*3/uL (ref 0.7–4.0)
MCH: 31.9 pg (ref 26.0–34.0)
MCHC: 33.8 g/dL (ref 30.0–36.0)
MCV: 94.4 fL (ref 80.0–100.0)
Monocytes Absolute: 0.3 10*3/uL (ref 0.1–1.0)
Monocytes Relative: 4 %
Neutro Abs: 3.3 10*3/uL (ref 1.7–7.7)
Neutrophils Relative %: 57 %
Platelet Count: 150 10*3/uL (ref 150–400)
RBC: 4.32 MIL/uL (ref 3.87–5.11)
RDW: 12 % (ref 11.5–15.5)
WBC Count: 5.8 10*3/uL (ref 4.0–10.5)
nRBC: 0 % (ref 0.0–0.2)

## 2023-10-09 LAB — CMP (CANCER CENTER ONLY)
ALT: 11 U/L (ref 0–44)
AST: 12 U/L — ABNORMAL LOW (ref 15–41)
Albumin: 4.5 g/dL (ref 3.5–5.0)
Alkaline Phosphatase: 49 U/L (ref 38–126)
Anion gap: 11 (ref 5–15)
BUN: 24 mg/dL — ABNORMAL HIGH (ref 8–23)
CO2: 26 mmol/L (ref 22–32)
Calcium: 10.1 mg/dL (ref 8.9–10.3)
Chloride: 104 mmol/L (ref 98–111)
Creatinine: 1.87 mg/dL — ABNORMAL HIGH (ref 0.44–1.00)
GFR, Estimated: 28 mL/min — ABNORMAL LOW (ref >60)
Glucose, Bld: 139 mg/dL — ABNORMAL HIGH (ref 70–99)
Potassium: 4.5 mmol/L (ref 3.5–5.1)
Sodium: 141 mmol/L (ref 135–145)
Total Bilirubin: 1.2 mg/dL (ref 0.0–1.2)
Total Protein: 6.9 g/dL (ref 6.5–8.1)

## 2023-10-09 MED ORDER — CIPROFLOXACIN HCL 500 MG PO TABS: 500.0000 mg | ORAL_TABLET | Freq: Every day | ORAL | 3 refills | Status: DC

## 2023-10-09 NOTE — Progress Notes (Unsigned)
 Patient here to discuss current treatment regimen. She does not feel like she is tolerating current regimen. She has also decided to proceed with chemo based on her oncotype score.    She will need port, chemo education and treatment start scheduled. Will follow up tomorrow once orders placed.   Oncology Nurse Navigator Documentation     10/09/2023    3:00 PM  Oncology Nurse Navigator Flowsheets  Navigator Follow Up Date: 10/10/2023  Navigator Follow Up Reason: Appointment Review  Navigator Location [facility]  Navigator Encounter Type Follow-up Appt  Patient Visit Type MedOnc  Treatment Phase Active Tx  Barriers/Navigation Needs Coordination of Care;Education  Interventions Psycho-Social Support  Acuity Level 2-Minimal Needs (1-2 Barriers Identified)  Time Spent with Patient 15

## 2023-10-09 NOTE — Progress Notes (Signed)
 START ON PATHWAY REGIMEN - Breast     A cycle is every 21 days:     Cyclophosphamide      Docetaxel   **Always confirm dose/schedule in your pharmacy ordering system**  Patient Characteristics: Preoperative or Nonsurgical Candidate, M0 (Clinical Staging), Up to cT4c, Any N, M0, Neoadjuvant Therapy followed by Surgery, Invasive Disease, Chemotherapy, HER2 Negative, ER Positive Therapeutic Status: Preoperative or Nonsurgical Candidate, M0 (Clinical Staging) AJCC M Category: cM0 AJCC Grade: G2 ER Status: Positive (+) AJCC 8 Stage Grouping: IIA HER2 Status: Negative (-) AJCC T Category: cT2 AJCC N Category: cN1 PR Status: Positive (+) Breast Surgical Plan: Neoadjuvant Therapy followed by Surgery Intent of Therapy: Curative Intent, Discussed with Patient

## 2023-10-09 NOTE — Progress Notes (Signed)
 Hematology and Oncology Follow Up Visit  [name] [id number] 08/13/1950 73 y.o. 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  I do long discussion and long reflection, she has decided to go with neoadjuvant chemotherapy.  She has talked with [name] of Surgical Oncology.  He felt that she would respond much better to chemotherapy and not endocrine therapy.  In fact, she is having trouble with the neoadjuvant endocrine therapy.  I think it is the Verzenio  that is causing her problems.  She is having a lot of nausea.  She just has no appetite.  She just does not feel all that well.  There is been no diarrhea.  I think the neoadjuvant chemotherapy would certainly be very reasonable.  I think we would have a very good chance of shrinking down her tumor.  I told her that hopefully she would not need any radiation therapy after neoadjuvant chemotherapy.  A lot will depend on whether or not we can clean out the lymph nodes in the axilla with chemotherapy.  She will need to have a Port-A-Cath.  I will have to speak with [name] regarding this.  Hopefully he can get 1 put in the day before she starts treatment.  I think that Taxotere/Cytoxan will be a very good regimen for her.  Again would be 4 cycles.  I went over toxicity with her.  She will need to have a chemotherapy education class.  We can set this up for next week.  Otherwise, she seems to be managing.  I know this has been a very difficult decision for her.  She has had to weigh a lot of factors.  Currently, I would have to say that her performance status is probably ECOG 1.   Medications:  Current  Outpatient Medications:    abemaciclib  (VERZENIO ) 150 MG tablet, Take 1 tablet (150 mg total) by mouth 2 (two) times daily., Disp: 56 tablet, Rfl: 11   Calcium Carbonate-Vitamin D 600-200 MG-UNIT TABS, Take by mouth daily., Disp: , Rfl:    citalopram  (CELEXA ) 10 MG tablet, Take 1 tablet (10 mg total) by mouth daily., Disp: 90 tablet, Rfl: 0   Collagen-Vitamin C-Biotin (COLLAGEN PO), Take by mouth daily., Disp: , Rfl:    ELDERBERRY PO, Take 1 tablet by mouth daily., Disp: , Rfl:    letrozole  (FEMARA ) 2.5 MG tablet, Take 1 tablet (2.5 mg total) by mouth daily., Disp: 30 tablet, Rfl: 12   losartan  (COZAAR ) 50 MG tablet, TAKE 1 TABLET BY MOUTH EVERY DAY, Disp: 90 tablet, Rfl: 0   Multiple Vitamins-Minerals (MULTIVITAMIN WITH MINERALS) tablet, Take by mouth., Disp: , Rfl:    ondansetron  (ZOFRAN ) 8 MG tablet, Take 1 tablet (8 mg total) by mouth every 8 (eight) hours as needed for nausea or vomiting., Disp: 30 tablet, Rfl: 2   TURMERIC PO, Take by mouth daily., Disp: , Rfl:    vitamin B-12 (CYANOCOBALAMIN) 1000 MCG tablet, Take by mouth daily., Disp: , Rfl:   Allergies:  Allergies  Allergen Reactions   Sulfa Antibiotics Rash    Past Medical History, Surgical history, Social history, and Family History were reviewed and updated.  Review of Systems: Review of Systems  Constitutional: Negative.   HENT:  Negative.  Negative for hearing loss.   Eyes: Negative.   Respiratory: Negative.    Cardiovascular: Negative.   Gastrointestinal: Negative.   Endocrine: Negative.   Genitourinary: Negative.    Musculoskeletal: Negative.   Skin: Negative.   Neurological: Negative.   Hematological: Negative.   Psychiatric/Behavioral: Negative.      Physical Exam:  Vital signs are temperature 99.2.  Pulse 84.  Blood pressure 127/67.  Weight is 175 pounds.  Wt Readings from Last 3 Encounters:  09/25/23 183 lb 1.9 oz (83.1 kg)  09/06/23 185 lb (83.9 kg)  08/08/22 175 lb (79.4 kg)    Physical  Exam Vitals reviewed.  Constitutional:      Comments: Breast exam shows left breast with no masses, edema or erythema.  There is no left axillary adenopathy.  Right breast does show the lump just adjacent to the areola at the 2 o'clock position.  This probably measures about 2-3 cm.  It is firm, it is slightly mobile.  There is no nipple inversion or nipple discharge.  I cannot palpate any right axillary adenopathy.  HENT:     Head: Normocephalic and atraumatic.   Eyes:     Pupils: Pupils are equal, round, and reactive to light.    Cardiovascular:     Rate and Rhythm: Normal rate and regular rhythm.     Heart sounds: Normal heart sounds.  Pulmonary:     Effort: Pulmonary effort is normal.     Breath sounds: Normal breath sounds.  Abdominal:     General: Bowel sounds are normal.     Palpations: Abdomen is soft.   Musculoskeletal:        General: No tenderness or deformity. Normal range of motion.     Cervical back: Normal range of motion.  Lymphadenopathy:     Cervical: No cervical adenopathy.   Skin:    General: Skin is warm and dry.     Findings: No erythema or rash.   Neurological:     Mental Status: She is alert and oriented to person, place, and time.   Psychiatric:        Behavior: Behavior normal.        Thought Content: Thought content normal.        Judgment: Judgment normal.     Lab Results  Component Value Date   WBC 5.8 10/09/2023   HGB 13.8 10/09/2023   HCT 40.8 10/09/2023   MCV 94.4 10/09/2023   PLT 150 10/09/2023     Chemistry      Component Value Date/Time   NA 143 09/25/2023 1350   NA 142 02/02/2015 0000   K 4.3 09/25/2023 1350   CL 106 09/25/2023 1350   CO2 29 09/25/2023 1350   BUN 17 09/25/2023 1350   BUN 13 02/02/2015 0000   CREATININE 1.08 (H) 09/25/2023 1350   GLU 101 02/02/2015 0000      Component Value Date/Time   CALCIUM 9.4 09/25/2023 1350   ALKPHOS 52 09/25/2023 1350   AST 15 09/25/2023 1350   ALT 11 09/25/2023 1350    BILITOT 0.6 09/25/2023 1350      Impression and Plan: [name] is a very charming 73 year old postmenopausal white female.  Again, surprisingly, she does have a high Oncotype score.  Again, we will we will move ahead with neoadjuvant chemotherapy with Taxotere/Cytoxan.  I think 4 cycles would be reasonable.  Again we will get [name] to put in the Port-A-Cath for us .  We will try  to get started the week of July 7.  Again we will have to watch out with dosing.  She will need to be on prophylactic antibiotics.  She will need to have G-CSF.  We will also see about chemotherapy education.  We will have one of our NP's see her at the day of her treatment.   Maude JONELLE Crease, MD 6/25/20253:14 PM

## 2023-10-09 NOTE — Progress Notes (Signed)
 Please see MyChart message dated 10/09/2023. Patient did not respond, so called at 11:30a.   Patient is able to come in today for lab check and to see Dr Timmy to discuss current treatment plan. She confirms today's appointment and time.   Oncology Nurse Navigator Documentation     10/09/2023   11:30 AM  Oncology Nurse Navigator Flowsheets  Navigator Follow Up Date: 10/09/2023  Navigator Follow Up Reason: Follow-up Appointment  Navigator Location [facility]  Navigator Encounter Type MyChart;Telephone  Telephone Outgoing Call  Patient Visit Type MedOnc  Treatment Phase Active Tx  Barriers/Navigation Needs Coordination of Care;Education  Education Other  Interventions Coordination of Care;Education  Acuity Level 2-Minimal Needs (1-2 Barriers Identified)  Coordination of Care Appts  Education Method Verbal  Time Spent with Patient 15

## 2023-10-10 ENCOUNTER — Encounter: Payer: Self-pay | Admitting: Hematology & Oncology

## 2023-10-10 ENCOUNTER — Other Ambulatory Visit: Payer: Self-pay

## 2023-10-10 ENCOUNTER — Encounter: Payer: Self-pay | Admitting: *Deleted

## 2023-10-10 ENCOUNTER — Other Ambulatory Visit: Payer: Self-pay | Admitting: General Surgery

## 2023-10-10 NOTE — Progress Notes (Signed)
 Port placement schedule for 10/21/2023 with Dr Ebbie. Message sent to scheduling for chemo education and chemo start.   Education scheduled for 10/15/2023. Chemo will start 10/23/2023.   Oncology Nurse Navigator Documentation     10/10/2023   11:30 AM  Oncology Nurse Navigator Flowsheets  Planned Course of Treatment Neo Chemo  Phase of Treatment Chemotherapy  Chemotherapy Pending- Reason: Oncologist Choice  Navigator Follow Up Date: 10/23/2023  Navigator Follow Up Reason: Follow-up Appointment;Chemotherapy  Navigator Location [facility]  Navigator Encounter Type Appt/Treatment Plan Review  Patient Visit Type MedOnc  Treatment Phase Active Tx  Barriers/Navigation Needs Coordination of Care;Education  Interventions Coordination of Care  Acuity Level 2-Minimal Needs (1-2 Barriers Identified)  Coordination of Care Appts  Support Groups/Services Friends and Family  Time Spent with Patient 15

## 2023-10-11 ENCOUNTER — Other Ambulatory Visit: Payer: Self-pay

## 2023-10-11 ENCOUNTER — Encounter (HOSPITAL_BASED_OUTPATIENT_CLINIC_OR_DEPARTMENT_OTHER): Payer: Self-pay | Admitting: General Surgery

## 2023-10-14 ENCOUNTER — Telehealth: Payer: Self-pay | Admitting: *Deleted

## 2023-10-14 ENCOUNTER — Other Ambulatory Visit: Payer: Self-pay | Admitting: *Deleted

## 2023-10-14 DIAGNOSIS — C50911 Malignant neoplasm of unspecified site of right female breast: Secondary | ICD-10-CM

## 2023-10-14 MED ORDER — PROCHLORPERAZINE MALEATE 10 MG PO TABS: 10.0000 mg | ORAL_TABLET | Freq: Four times a day (QID) | ORAL | 1 refills | Status: DC | PRN

## 2023-10-14 MED ORDER — DEXAMETHASONE 4 MG PO TABS: ORAL_TABLET | ORAL | 1 refills | Status: DC

## 2023-10-14 MED ORDER — ONDANSETRON HCL 8 MG PO TABS: 8.0000 mg | ORAL_TABLET | Freq: Three times a day (TID) | ORAL | 1 refills | Status: DC | PRN

## 2023-10-14 NOTE — Telephone Encounter (Signed)
 E-mail received to click and view KEY: [id number] on CoverMyMeds. Request has been deleted. No notes af status.  Connected with [facility] [phone]).  Requires PA  Called [employer] [phone]) to initiate prior authorization for ondansetron  8mg  tablets.  Requested further information about treatment plan.  Routed most recent office note to [phone].  Provided [facility] phone and fax numbers for outcome to be faxed to this office.SABRA

## 2023-10-15 ENCOUNTER — Inpatient Hospital Stay: Attending: Hematology & Oncology

## 2023-10-15 DIAGNOSIS — Z5111 Encounter for antineoplastic chemotherapy: Secondary | ICD-10-CM | POA: Insufficient documentation

## 2023-10-15 DIAGNOSIS — Z170 Estrogen receptor positive status [ER+]: Secondary | ICD-10-CM | POA: Insufficient documentation

## 2023-10-15 DIAGNOSIS — Z5189 Encounter for other specified aftercare: Secondary | ICD-10-CM | POA: Insufficient documentation

## 2023-10-15 DIAGNOSIS — C773 Secondary and unspecified malignant neoplasm of axilla and upper limb lymph nodes: Secondary | ICD-10-CM | POA: Insufficient documentation

## 2023-10-15 DIAGNOSIS — C50011 Malignant neoplasm of nipple and areola, right female breast: Secondary | ICD-10-CM | POA: Insufficient documentation

## 2023-10-15 NOTE — Progress Notes (Signed)
 Pharmacist Chemotherapy Monitoring - Initial Assessment    Anticipated start date: 10/23/23   The following has been reviewed per standard work regarding the patient's treatment regimen: The patient's diagnosis, treatment plan and drug doses, and organ/hematologic function Lab orders and baseline tests specific to treatment regimen  The treatment plan start date, drug sequencing, and pre-medications Prior authorization status  Patient's documented medication list, including drug-drug interaction screen and prescriptions for anti-emetics and supportive care specific to the treatment regimen The drug concentrations, fluid compatibility, administration routes, and timing of the medications to be used The patient's access for treatment and lifetime cumulative dose history, if applicable  The patient's medication allergies and previous infusion related reactions, if applicable   Changes made to treatment plan:  N/A  Follow up needed:  N/A   [name], Olam Browning, RPH, 10/15/2023  10:14 AM

## 2023-10-15 NOTE — Progress Notes (Signed)
 Patient in chemotherapy education class with self.  Discussed side effects of Taxotere and Cytoxan which include but are not limited to myelosuppression, decreased appetite, fatigue, fever, allergic or infusional reaction, mucositis, cardiac toxicity, cough, SOB, altered taste, nausea and vomiting, diarrhea, constipation, elevated LFTs myalgia and arthralgias, hair loss or thinning, rash, skin dryness, nail changes, peripheral neuropathy, discolored urine, delayed wound healing, mental changes (Chemo brain), increased risk of infections, weight loss.  Reviewed infusion room and office policy and procedure and phone numbers 24 hours x 7 days a week.    Reviewed when to call the office with any concerns or problems.  Transport planner given.  Discussed portacath insertion and EMLA cream administration.  Antiemetic protocol and chemotherapy schedule reviewed. Patient verbalized understanding of chemotherapy indications and possible side effects.  Teachback done

## 2023-10-16 ENCOUNTER — Encounter (HOSPITAL_BASED_OUTPATIENT_CLINIC_OR_DEPARTMENT_OTHER)
Admission: RE | Admit: 2023-10-16 | Discharge: 2023-10-16 | Disposition: A | Discharge status: Home / Self Care | Source: Ambulatory Visit | Attending: General Surgery | Admitting: General Surgery

## 2023-10-16 DIAGNOSIS — I10 Essential (primary) hypertension: Secondary | ICD-10-CM | POA: Diagnosis not present

## 2023-10-16 DIAGNOSIS — Z01810 Encounter for preprocedural cardiovascular examination: Secondary | ICD-10-CM | POA: Insufficient documentation

## 2023-10-16 MED ORDER — CHLORHEXIDINE GLUCONATE CLOTH 2 % EX PADS: 6.0000 | MEDICATED_PAD | Freq: Once | CUTANEOUS | Status: DC

## 2023-10-16 MED ORDER — ENSURE PRE-SURGERY PO LIQD: 296.0000 mL | Freq: Once | ORAL | Status: DC

## 2023-10-16 NOTE — Progress Notes (Signed)

## 2023-10-21 ENCOUNTER — Encounter (HOSPITAL_BASED_OUTPATIENT_CLINIC_OR_DEPARTMENT_OTHER)
Admission: RE | Disposition: A | Discharge status: Home / Self Care | Payer: Self-pay | Source: Home / Self Care | Attending: General Surgery

## 2023-10-21 ENCOUNTER — Other Ambulatory Visit: Payer: Self-pay

## 2023-10-21 ENCOUNTER — Ambulatory Visit (HOSPITAL_COMMUNITY)

## 2023-10-21 ENCOUNTER — Ambulatory Visit (HOSPITAL_BASED_OUTPATIENT_CLINIC_OR_DEPARTMENT_OTHER)
Admission: RE | Admit: 2023-10-21 | Discharge: 2023-10-21 | Disposition: A | Discharge status: Home / Self Care | Attending: General Surgery | Admitting: General Surgery

## 2023-10-21 ENCOUNTER — Ambulatory Visit (HOSPITAL_BASED_OUTPATIENT_CLINIC_OR_DEPARTMENT_OTHER): Admitting: Certified Registered"

## 2023-10-21 ENCOUNTER — Encounter (HOSPITAL_BASED_OUTPATIENT_CLINIC_OR_DEPARTMENT_OTHER): Payer: Self-pay | Admitting: General Surgery

## 2023-10-21 DIAGNOSIS — C773 Secondary and unspecified malignant neoplasm of axilla and upper limb lymph nodes: Secondary | ICD-10-CM | POA: Diagnosis not present

## 2023-10-21 DIAGNOSIS — I10 Essential (primary) hypertension: Secondary | ICD-10-CM | POA: Diagnosis not present

## 2023-10-21 DIAGNOSIS — K219 Gastro-esophageal reflux disease without esophagitis: Secondary | ICD-10-CM | POA: Diagnosis not present

## 2023-10-21 DIAGNOSIS — C50811 Malignant neoplasm of overlapping sites of right female breast: Secondary | ICD-10-CM | POA: Diagnosis not present

## 2023-10-21 DIAGNOSIS — C50911 Malignant neoplasm of unspecified site of right female breast: Secondary | ICD-10-CM | POA: Diagnosis not present

## 2023-10-21 DIAGNOSIS — Z79899 Other long term (current) drug therapy: Secondary | ICD-10-CM | POA: Diagnosis not present

## 2023-10-21 DIAGNOSIS — R0989 Other specified symptoms and signs involving the circulatory and respiratory systems: Secondary | ICD-10-CM | POA: Diagnosis not present

## 2023-10-21 HISTORY — DX: Anxiety disorder, unspecified: F41.9

## 2023-10-21 HISTORY — DX: Other complications of anesthesia, initial encounter: T88.59XA

## 2023-10-21 HISTORY — DX: Unspecified osteoarthritis, unspecified site: M19.90

## 2023-10-21 HISTORY — DX: Depression, unspecified: F32.A

## 2023-10-21 HISTORY — PX: PORTACATH PLACEMENT: SHX2246

## 2023-10-21 HISTORY — DX: Nausea with vomiting, unspecified: Z98.890

## 2023-10-21 HISTORY — DX: Nausea with vomiting, unspecified: R11.2

## 2023-10-21 SURGERY — INSERTION, TUNNELED CENTRAL VENOUS DEVICE, WITH PORT
Anesthesia: General | Laterality: Left

## 2023-10-21 MED ORDER — AMISULPRIDE (ANTIEMETIC) 5 MG/2ML IV SOLN: 10.0000 mg | Freq: Once | INTRAVENOUS | Status: DC | PRN

## 2023-10-21 MED ORDER — HEPARIN SOD (PORK) LOCK FLUSH 100 UNIT/ML IV SOLN
INTRAVENOUS | Status: DC | PRN
Start: 2023-10-21 — End: 2023-10-21
  Administered 2023-10-21: 450 [IU] via INTRAVENOUS

## 2023-10-21 MED ORDER — OXYCODONE HCL 5 MG PO TABS: 5.0000 mg | ORAL_TABLET | Freq: Once | ORAL | Status: DC | PRN

## 2023-10-21 MED ORDER — LACTATED RINGERS IV SOLN: INTRAVENOUS | Status: DC

## 2023-10-21 MED ORDER — ACETAMINOPHEN 500 MG PO TABS
1000.0000 mg | ORAL_TABLET | ORAL | Status: AC
  Administered 2023-10-21: 1000 mg via ORAL

## 2023-10-21 MED ORDER — HEPARIN (PORCINE) IN NACL 2-0.9 UNITS/ML
INTRAMUSCULAR | Status: AC | PRN
  Administered 2023-10-21: 500 mL

## 2023-10-21 MED ORDER — ONDANSETRON HCL 4 MG/2ML IJ SOLN
INTRAMUSCULAR | Status: AC
  Filled 2023-10-21: qty 2

## 2023-10-21 MED ORDER — HEPARIN (PORCINE) IN NACL 1000-0.9 UT/500ML-% IV SOLN
INTRAVENOUS | Status: AC
  Filled 2023-10-21: qty 500

## 2023-10-21 MED ORDER — OXYCODONE HCL 5 MG/5ML PO SOLN: 5.0000 mg | Freq: Once | ORAL | Status: DC | PRN

## 2023-10-21 MED ORDER — DEXAMETHASONE SODIUM PHOSPHATE 10 MG/ML IJ SOLN
INTRAMUSCULAR | Status: AC
  Filled 2023-10-21: qty 1

## 2023-10-21 MED ORDER — FENTANYL CITRATE (PF) 100 MCG/2ML IJ SOLN
INTRAMUSCULAR | Status: DC | PRN
  Administered 2023-10-21 (×2): 25 ug via INTRAVENOUS

## 2023-10-21 MED ORDER — HYDROMORPHONE HCL 1 MG/ML IJ SOLN: 0.2500 mg | INTRAMUSCULAR | Status: DC | PRN

## 2023-10-21 MED ORDER — HEPARIN SOD (PORK) LOCK FLUSH 100 UNIT/ML IV SOLN
INTRAVENOUS | Status: AC
  Filled 2023-10-21: qty 5

## 2023-10-21 MED ORDER — BUPIVACAINE HCL (PF) 0.25 % IJ SOLN
INTRAMUSCULAR | Status: DC | PRN
  Administered 2023-10-21: 5 mL

## 2023-10-21 MED ORDER — ONDANSETRON HCL 4 MG/2ML IJ SOLN
INTRAMUSCULAR | Status: DC | PRN
  Administered 2023-10-21: 4 mg via INTRAVENOUS

## 2023-10-21 MED ORDER — LIDOCAINE 2% (20 MG/ML) 5 ML SYRINGE
INTRAMUSCULAR | Status: AC
Start: 2023-10-21 — End: 2023-10-21
  Filled 2023-10-21: qty 5

## 2023-10-21 MED ORDER — DEXAMETHASONE SODIUM PHOSPHATE 10 MG/ML IJ SOLN
INTRAMUSCULAR | Status: DC | PRN
  Administered 2023-10-21: 8 mg via INTRAVENOUS

## 2023-10-21 MED ORDER — FENTANYL CITRATE (PF) 100 MCG/2ML IJ SOLN
INTRAMUSCULAR | Status: AC
Start: 2023-10-21 — End: 2023-10-21
  Filled 2023-10-21: qty 2

## 2023-10-21 MED ORDER — ACETAMINOPHEN 500 MG PO TABS
ORAL_TABLET | ORAL | Status: AC
  Filled 2023-10-21: qty 2

## 2023-10-21 MED ORDER — PROPOFOL 10 MG/ML IV BOLUS
INTRAVENOUS | Status: DC | PRN
  Administered 2023-10-21: 200 ug/kg/min via INTRAVENOUS
  Administered 2023-10-21: 200 mg via INTRAVENOUS

## 2023-10-21 MED ORDER — PROPOFOL 500 MG/50ML IV EMUL
INTRAVENOUS | Status: AC
  Filled 2023-10-21: qty 50

## 2023-10-21 MED ORDER — CEFAZOLIN SODIUM-DEXTROSE 2-4 GM/100ML-% IV SOLN
2.0000 g | INTRAVENOUS | Status: AC
  Administered 2023-10-21: 2 g via INTRAVENOUS

## 2023-10-21 MED ORDER — PROPOFOL 10 MG/ML IV BOLUS
INTRAVENOUS | Status: AC
  Filled 2023-10-21: qty 20

## 2023-10-21 MED ORDER — SODIUM CHLORIDE 0.9 % IV SOLN
12.5000 mg | INTRAVENOUS | Status: DC | PRN
  Filled 2023-10-21: qty 0.5

## 2023-10-21 MED ORDER — CEFAZOLIN SODIUM-DEXTROSE 2-4 GM/100ML-% IV SOLN
INTRAVENOUS | Status: AC
Start: 2023-10-21 — End: 2023-10-21
  Filled 2023-10-21: qty 100

## 2023-10-21 MED ORDER — LIDOCAINE 2% (20 MG/ML) 5 ML SYRINGE
INTRAMUSCULAR | Status: DC | PRN
Start: 2023-10-21 — End: 2023-10-21
  Administered 2023-10-21: 60 mg via INTRAVENOUS

## 2023-10-21 SURGICAL SUPPLY — 39 items
BAG DECANTER FOR FLEXI CONT (MISCELLANEOUS) ×1 IMPLANT
BENZOIN TINCTURE PRP APPL 2/3 (GAUZE/BANDAGES/DRESSINGS) ×1 IMPLANT
BLADE SURG 11 STRL SS (BLADE) ×1 IMPLANT
BLADE SURG 15 STRL LF DISP TIS (BLADE) ×1 IMPLANT
CANISTER SUCT 1200ML W/VALVE (MISCELLANEOUS) IMPLANT
CHLORAPREP W/TINT 26 (MISCELLANEOUS) ×1 IMPLANT
COVER BACK TABLE 60X90IN (DRAPES) ×1 IMPLANT
COVER MAYO STAND STRL (DRAPES) ×1 IMPLANT
DERMABOND ADVANCED .7 DNX12 (GAUZE/BANDAGES/DRESSINGS) ×1 IMPLANT
DRAPE C-ARM 42X72 X-RAY (DRAPES) ×1 IMPLANT
DRAPE LAPAROSCOPIC ABDOMINAL (DRAPES) ×1 IMPLANT
DRAPE UTILITY XL STRL (DRAPES) ×1 IMPLANT
DRSG TEGADERM 4X4.75 (GAUZE/BANDAGES/DRESSINGS) IMPLANT
ELECT COATED BLADE 2.86 ST (ELECTRODE) ×1 IMPLANT
ELECTRODE REM PT RTRN 9FT ADLT (ELECTROSURGICAL) ×1 IMPLANT
GAUZE 4X4 16PLY ~~LOC~~+RFID DBL (SPONGE) ×1 IMPLANT
GAUZE SPONGE 4X4 12PLY STRL LF (GAUZE/BANDAGES/DRESSINGS) ×1 IMPLANT
GLOVE BIO SURGEON STRL SZ7 (GLOVE) ×1 IMPLANT
GLOVE BIOGEL PI IND STRL 7.5 (GLOVE) ×1 IMPLANT
GOWN STRL REUS W/ TWL LRG LVL3 (GOWN DISPOSABLE) ×2 IMPLANT
KIT CVR 48X5XPRB PLUP LF (MISCELLANEOUS) IMPLANT
KIT PORT POWER 8FR ISP CVUE (Port) IMPLANT
NDL HYPO 25X1 1.5 SAFETY (NEEDLE) ×1 IMPLANT
NDL SAFETY ECLIPSE 18X1.5 (NEEDLE) IMPLANT
NEEDLE HYPO 25X1 1.5 SAFETY (NEEDLE) ×1 IMPLANT
PACK BASIN DAY SURGERY FS (CUSTOM PROCEDURE TRAY) ×1 IMPLANT
PENCIL SMOKE EVACUATOR (MISCELLANEOUS) ×1 IMPLANT
SLEEVE SCD COMPRESS KNEE MED (STOCKING) ×1 IMPLANT
SPIKE FLUID TRANSFER (MISCELLANEOUS) IMPLANT
STRIP CLOSURE SKIN 1/2X4 (GAUZE/BANDAGES/DRESSINGS) ×1 IMPLANT
SUT MNCRL AB 4-0 PS2 18 (SUTURE) ×1 IMPLANT
SUT PROLENE 2 0 SH DA (SUTURE) ×1 IMPLANT
SUT SILK 2 0 TIES 17X18 (SUTURE) IMPLANT
SUT VIC AB 3-0 SH 27X BRD (SUTURE) ×1 IMPLANT
SYR 5ML LUER SLIP (SYRINGE) ×1 IMPLANT
SYR CONTROL 10ML LL (SYRINGE) ×1 IMPLANT
TOWEL GREEN STERILE FF (TOWEL DISPOSABLE) ×1 IMPLANT
TUBE CONNECTING 20X1/4 (TUBING) IMPLANT
YANKAUER SUCT BULB TIP NO VENT (SUCTIONS) IMPLANT

## 2023-10-21 NOTE — Op Note (Signed)
 Preoperative diagnosis: Clinical stage II right breast cancer Postoperative diagnosis: Same as above Procedure: Left subclavian port placement Surgeon: Dr. 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  She ends up having a high Oncotype score and we elected along with medical oncology to proceed with primary systemic chemotherapy.  I discussed port placement with her.  Procedure: After informed consent was obtained she was taken to the operating room.  She was given antibiotic she was placed under anesthesia without complication.  She was prepped and draped in standard sterile surgical fashion.  Surgical timeout was then performed.  I was able under ultrasound guidance to access the left internal jugular vein easily.  I confirmed this with the ultrasound.   attempts to pass the wire I was unable to do that.  The wire did not appear to flow easily into the vein.  Her internal jugular vein was also fairly diminutive.  Due to this I elected to just place a left subclavian line.  I then was able to access the subclavian vein on the first pass without difficulty.  This aspirated venous blood.  I then placed a wire.  This looked to be in position on fluoroscopy.  I then made an incision created a pocket below this.  I tunneled the line between the 2 sites.  I then placed the dilator over the wire under fluoroscopy.  I remove the wire assembly.  The line was then placed through the sheath.  I then removed the peel-away sheath.  The line was pulled back to be in the distal cava.  There was no ectopy present.  I then attached the port and sutured this into the position with a 2-0 Prolene suture.  The port flushed easily and aspirated blood.  I placed concentrated heparin  in it.  I then did 1 final C arm image  and this was all in good position.  I then closed this with 3-0 Vicryl for Monocryl.  Glue and a Steri-Strip were placed.  She tolerated this well and was transferred to recovery stable.

## 2023-10-21 NOTE — Anesthesia Postprocedure Evaluation (Signed)
 Anesthesia Post Note  Patient: [name]  Procedure(s) Performed: INSERTION, TUNNELED CENTRAL VENOUS DEVICE, WITH PORT (Left)     Patient location during evaluation: PACU Anesthesia Type: General Level of consciousness: awake and alert Pain management: pain level controlled Vital Signs Assessment: post-procedure vital signs reviewed and stable Respiratory status: spontaneous breathing, nonlabored ventilation and respiratory function stable Cardiovascular status: blood pressure returned to baseline and stable Postop Assessment: no apparent nausea or vomiting Anesthetic complications: no   No notable events documented.  Last Vitals:  Vitals:   10/21/23 1200 10/21/23 1227  BP: (!) 153/77 (!) 143/85  Pulse: (!) 50 (!) 53  Resp: 18 16  Temp:  36.8 C  SpO2: 97% 97%    Last Pain:  Vitals:   10/21/23 1227  TempSrc:   PainSc: 2                  Butler Levander Pinal

## 2023-10-21 NOTE — Discharge Instructions (Addendum)
 PORT-A-CATH: POST OP INSTRUCTIONS  Always review your discharge instruction sheet given to you by the facility where your surgery was performed.   A prescription for pain medication may be given to you upon discharge. Take your pain medication as prescribed, if needed. If narcotic pain medicine is not needed, then you make take acetaminophen  (Tylenol ) or ibuprofen (Advil) as needed.  Take your usually prescribed medications unless otherwise directed. If you need a refill on your pain medication, please contact our office. All narcotic pain medicine now requires a paper prescription.  Phoned in and fax refills are no longer allowed by law.  Prescriptions will not be filled after 5 pm or on weekends.  You should follow a light diet for the remainder of the day after your procedure. Most patients will experience some mild swelling and/or bruising in the area of the incision. It may take several days to resolve. It is common to experience some constipation if taking pain medication after surgery. Increasing fluid intake and taking a stool softener (such as Colace) will usually help or prevent this problem from occurring. A mild laxative (Milk of Magnesia or Miralax) should be taken according to package directions if there are no bowel movements after 48 hours.  Unless discharge instructions indicate otherwise, you may remove your bandages 48 hours after surgery, and you may shower at that time. You may have steri-strips (small white skin tapes) in place directly over the incision.  These strips should be left on the skin for 7-10 days.  If your surgeon used Dermabond (skin glue) on the incision, you may shower in 24 hours.  The glue will flake off over the next 2-3 weeks.  If your port is left accessed at the end of surgery (needle left in port), the dressing cannot get wet and should only by changed by a healthcare professional. When the port is no longer accessed (when the needle has been removed),  follow step 7.   ACTIVITIES:  Limit activity involving your arms for the next 72 hours. Do no strenuous exercise or activity for 1 week. You may drive when you are no longer taking prescription pain medication, you can comfortably wear a seatbelt, and you can maneuver your car. 10.You may need to see your doctor in the office for a follow-up appointment.  Please       check with your doctor.  11.When you receive a new Port-a-Cath, you will get a product guide and        ID card.  Please keep them in case you need them.  WHEN TO CALL YOUR DOCTOR [phone]): Fever over 101.0 Chills Continued bleeding from incision Increased redness and tenderness at the site Shortness of breath, difficulty breathing   The clinic staff is available to answer your questions during regular business hours. Please don't hesitate to call and ask to speak to one of the nurses or medical assistants for clinical concerns. If you have a medical emergency, go to the nearest emergency room or call 911.  A surgeon from [facility] is always on call at the hospital.     For further information, please visit [email or web address]   Post Anesthesia Home Care Instructions  Activity: Get plenty of rest for the remainder of the day. A responsible individual must stay with you for 24 hours following the procedure.  For the next 24 hours, DO NOT: -Drive a car -Advertising copywriter -Drink alcoholic beverages -Take any medication unless instructed by your physician -Make  any legal decisions or sign important papers.  Meals: Start with liquid foods such as gelatin or soup. Progress to regular foods as tolerated. Avoid greasy, spicy, heavy foods. If nausea and/or vomiting occur, drink only clear liquids until the nausea and/or vomiting subsides. Call your physician if vomiting continues.  Special Instructions/Symptoms: Your throat may feel dry or sore from the anesthesia or the breathing tube placed  in your throat during surgery. If this causes discomfort, gargle with warm salt water. The discomfort should disappear within 24 hours.  If you had a scopolamine  patch placed behind your ear for the management of post- operative nausea and/or vomiting:  1. The medication in the patch is effective for 72 hours, after which it should be removed.  Wrap patch in a tissue and discard in the trash. Wash hands thoroughly with soap and water. 2. You may remove the patch earlier than 72 hours if you experience unpleasant side effects which may include dry mouth, dizziness or visual disturbances. 3. Avoid touching the patch. Wash your hands with soap and water after contact with the patch.     Last received tylenol  at 935am

## 2023-10-21 NOTE — Transfer of Care (Signed)
 Immediate Anesthesia Transfer of Care Note  Patient: [name]  Procedure(s) Performed: Procedure(s) (LRB): INSERTION, TUNNELED CENTRAL VENOUS DEVICE, WITH PORT (Left)  Patient Location: PACU  Anesthesia Type: General  Level of Consciousness: awake, oriented, sedated and patient cooperative  Airway & Oxygen Therapy: Patient Spontanous Breathing and Patient connected to face mask oxygen  Post-op Assessment: Report given to PACU RN and Post -op Vital signs reviewed and stable  Post vital signs: Reviewed and stable  Complications: No apparent anesthesia complications Last Vitals:  Vitals Value Taken Time  BP 119/93 10/21/23 11:27  Temp    Pulse 58 10/21/23 11:29  Resp 22 10/21/23 11:29  SpO2 99 % 10/21/23 11:29  Vitals shown include unfiled device data.  Last Pain:  Vitals:   10/21/23 0930  TempSrc: Temporal  PainSc: 0-No pain         Complications: No notable events documented.

## 2023-10-21 NOTE — H&P (Signed)
 81 yof with palpable right breast mass. Last mm two years ago. She had mm that shows a 3.3 cm mass at 3 oclock with 1.2 cm satellite as well as an abnormal node. Left side with some abnormalities which have now been cleared with aspiration/biopsy. The right side has pos node and two other biopsies that are grade II IDC that is 60% er pos, 70% pr pos, her 2 negative and Ki is 15%. PET is negative. MRI shows a 2.5x2.4x3 cm mass present she is here to discuss options.  She has been seen by oncology who discussed primary chemotherapy.  Review of Systems: A complete review of systems was obtained from the patient. I have reviewed this information and discussed as appropriate with the patient. See HPI as well for other ROS.  Review of Systems  All other systems reviewed and are negative.   Medical History: No past medical history on file.  Patient Active Problem List  Diagnosis  Breast cancer metastasized to axillary lymph node, right (CMS/HHS-HCC)   No past surgical history on file.   Allergies  Allergen Reactions  Sulfa (Sulfonamide Antibiotics) Rash   Current Outpatient Medications on File Prior to Visit  Medication Sig Dispense Refill  calcium carbonate-vitamin D3 (CALTRATE 600+D) 600 mg-5 mcg (200 unit) tablet Take by mouth  citalopram  (CELEXA ) 10 MG tablet  cyanocobalamin (VITAMIN B12) 1000 MCG tablet Take 1,000 mcg by mouth  losartan  (COZAAR ) 50 MG tablet Take 1 tablet by mouth once daily  multivitamin with minerals tablet Take by mouth   No current facility-administered medications on file prior to visit.   No family history on file.   Social History   Tobacco Use  Smoking Status Not on file  Smokeless Tobacco Not on file    Social History   Socioeconomic History  Marital status: Divorced   Social Drivers of Health   Housing Stability: Unknown (09/16/2023)  Housing Stability Vital Sign  Homeless in the Last Year: No   Objective:   Vitals:  09/16/23 1038  BP:  (!) 148/90  Pulse: 84  SpO2: 96%  Weight: 83.3 kg (183 lb 9.6 oz)  Height: 170.2 cm (5' 7)  PainSc: 1  PainLoc: Breast   Body mass index is 28.76 kg/m.  Physical Exam Vitals reviewed.  Constitutional:  Appearance: Normal appearance.  Neurological:  Mental Status: She is alert.     Assessment and Plan:   Diagnoses and all orders for this visit:  Breast cancer metastasized to axillary lymph node, right (CMS/HHS-HCC)  oncotype on core to determine need for chemotherapy vs primary antiestrogen therapy to downsize, sozo visit  Oncotype high so in discussion with Dr Timmy had elected to proceed with primary chemotherapy  We discussed the staging and pathophysiology of breast cancer. We discussed all of the different options for treatment for breast cancer including surgery, chemotherapy, radiation therapy, and antiestrogen therapy  We discussed a sentinel lymph node biopsy as well as targeted node removal. Discussed risks of lymphedema. Will do sozo preop.  We discussed the options for treatment of the breast cancer which included lumpectomy versus a mastectomy. We discussed the performance of the lumpectomy with radioactive seed placement. We discussed a 5-10% chance of a positive margin requiring reexcision in the operating room. We also discussed that she will likely need radiation therapy if she undergoes lumpectomy. We discussed mastectomy and the postoperative care for that as well. Mastectomy can be followed by reconstruction. The decision for lumpectomy vs mastectomy has no impact on  decision for chemotherapy. Most mastectomy patients will not need radiation therapy. We discussed that there is no difference in her survival whether she undergoes lumpectomy with radiation therapy or antiestrogen therapy versus a mastectomy. There is also no real difference between her recurrence in the breast   We discussed the risks of operation including bleeding, infection, possible  reoperation. She understands her further therapy will be based on what her stages at the time of her operation.

## 2023-10-21 NOTE — Interval H&P Note (Signed)
 History and Physical Interval Note:  10/21/2023 10:02 AM  [name]  has presented today for surgery, with the diagnosis of BREAST CANCER.  The various methods of treatment have been discussed with the patient and family. After consideration of risks, benefits and other options for treatment, the patient has consented to  Procedure(s) with comments: INSERTION, TUNNELED CENTRAL VENOUS DEVICE, WITH PORT (N/A) - PORT PLACEMENT WITH ULTRASOUND GUIDANCE LMA as a surgical intervention.  The patient's history has been reviewed, patient examined, no change in status, stable for surgery.  I have reviewed the patient's chart and labs.  Questions were answered to the patient's satisfaction.     Donnice Bury

## 2023-10-21 NOTE — Anesthesia Procedure Notes (Signed)
 Procedure Name: LMA Insertion Date/Time: 10/21/2023 10:42 AM  Performed by: Delayne Olam BIRCH, CRNAPre-anesthesia Checklist: Patient identified, Emergency Drugs available, Suction available and Patient being monitored Patient Re-evaluated:Patient Re-evaluated prior to induction Oxygen Delivery Method: Circle system utilized Preoxygenation: Pre-oxygenation with 100% oxygen Induction Type: IV induction Ventilation: Mask ventilation without difficulty LMA: LMA inserted LMA Size: 4.0 Number of attempts: 1 Airway Equipment and Method: Bite block Placement Confirmation: positive ETCO2 Tube secured with: Tape Dental Injury: Teeth and Oropharynx as per pre-operative assessment

## 2023-10-21 NOTE — Anesthesia Preprocedure Evaluation (Addendum)
 Anesthesia Evaluation  Patient identified by MRN, date of birth, ID band Patient awake    Reviewed: Allergy & Precautions, NPO status , Patient's Chart, lab work & pertinent test results  History of Anesthesia Complications (+) PONV and history of anesthetic complications  Airway Mallampati: II  TM Distance: >3 FB Neck ROM: Full    Dental no notable dental hx.    Pulmonary neg pulmonary ROS   Pulmonary exam normal breath sounds clear to auscultation       Cardiovascular hypertension, Pt. on medications Normal cardiovascular exam Rhythm:Regular Rate:Normal     Neuro/Psych  PSYCHIATRIC DISORDERS Anxiety Depression    negative neurological ROS     GI/Hepatic Neg liver ROS,GERD  Controlled,,  Endo/Other  negative endocrine ROS    Renal/GU negative Renal ROS     Musculoskeletal  (+) Arthritis ,    Abdominal   Peds  Hematology negative hematology ROS (+)   Anesthesia Other Findings   Reproductive/Obstetrics                              Anesthesia Physical Anesthesia Plan  ASA: 2  Anesthesia Plan: General   Post-op Pain Management: Tylenol  PO (pre-op)*   Induction: Intravenous  PONV Risk Score and Plan: 4 or greater and Ondansetron , Dexamethasone  and Treatment may vary due to age or medical condition  Airway Management Planned: LMA  Additional Equipment:   Intra-op Plan:   Post-operative Plan: Extubation in OR  Informed Consent: I have reviewed the patients History and Physical, chart, labs and discussed the procedure including the risks, benefits and alternatives for the proposed anesthesia with the patient or authorized representative who has indicated his/her understanding and acceptance.     Dental advisory given  Plan Discussed with: CRNA  Anesthesia Plan Comments:          Anesthesia Quick Evaluation

## 2023-10-22 ENCOUNTER — Other Ambulatory Visit: Payer: Self-pay

## 2023-10-22 ENCOUNTER — Encounter (HOSPITAL_BASED_OUTPATIENT_CLINIC_OR_DEPARTMENT_OTHER): Payer: Self-pay | Admitting: General Surgery

## 2023-10-22 ENCOUNTER — Other Ambulatory Visit (HOSPITAL_COMMUNITY): Payer: Self-pay

## 2023-10-22 NOTE — Progress Notes (Signed)
 Verzenio  was discontinued on 10/09/23 and patient will be transitioning to IV chemotherapy.  Disenrolling from Specialty Pharmacy services at this time.

## 2023-10-23 ENCOUNTER — Inpatient Hospital Stay: Admitting: Medical Oncology

## 2023-10-23 ENCOUNTER — Inpatient Hospital Stay

## 2023-10-23 ENCOUNTER — Encounter: Payer: Self-pay | Admitting: *Deleted

## 2023-10-23 ENCOUNTER — Encounter: Payer: Self-pay | Admitting: Hematology & Oncology

## 2023-10-23 ENCOUNTER — Inpatient Hospital Stay: Admitting: Licensed Clinical Social Worker

## 2023-10-23 VITALS — BP 152/83 | HR 69 | Temp 98.3°F | Resp 18 | Wt 183.1 lb

## 2023-10-23 VITALS — BP 144/72 | HR 61 | Temp 97.9°F | Resp 18

## 2023-10-23 DIAGNOSIS — C50011 Malignant neoplasm of nipple and areola, right female breast: Secondary | ICD-10-CM | POA: Diagnosis not present

## 2023-10-23 DIAGNOSIS — C50911 Malignant neoplasm of unspecified site of right female breast: Secondary | ICD-10-CM

## 2023-10-23 DIAGNOSIS — Z170 Estrogen receptor positive status [ER+]: Secondary | ICD-10-CM | POA: Diagnosis not present

## 2023-10-23 DIAGNOSIS — Z5111 Encounter for antineoplastic chemotherapy: Secondary | ICD-10-CM | POA: Diagnosis not present

## 2023-10-23 DIAGNOSIS — Z5189 Encounter for other specified aftercare: Secondary | ICD-10-CM | POA: Diagnosis not present

## 2023-10-23 DIAGNOSIS — C773 Secondary and unspecified malignant neoplasm of axilla and upper limb lymph nodes: Secondary | ICD-10-CM | POA: Diagnosis not present

## 2023-10-23 LAB — CBC WITH DIFFERENTIAL (CANCER CENTER ONLY)
Abs Immature Granulocytes: 0.09 K/uL — ABNORMAL HIGH (ref 0.00–0.07)
Basophils Absolute: 0 K/uL (ref 0.0–0.1)
Basophils Relative: 0 %
Eosinophils Absolute: 0 K/uL (ref 0.0–0.5)
Eosinophils Relative: 0 %
HCT: 32.9 % — ABNORMAL LOW (ref 36.0–46.0)
Hemoglobin: 11.4 g/dL — ABNORMAL LOW (ref 12.0–15.0)
Immature Granulocytes: 1 %
Lymphocytes Relative: 13 %
Lymphs Abs: 1 K/uL (ref 0.7–4.0)
MCH: 32.3 pg (ref 26.0–34.0)
MCHC: 34.7 g/dL (ref 30.0–36.0)
MCV: 93.2 fL (ref 80.0–100.0)
Monocytes Absolute: 0.5 K/uL (ref 0.1–1.0)
Monocytes Relative: 6 %
Neutro Abs: 6 K/uL (ref 1.7–7.7)
Neutrophils Relative %: 80 %
Platelet Count: 116 K/uL — ABNORMAL LOW (ref 150–400)
RBC: 3.53 MIL/uL — ABNORMAL LOW (ref 3.87–5.11)
RDW: 12.5 % (ref 11.5–15.5)
WBC Count: 7.5 K/uL (ref 4.0–10.5)
nRBC: 0 % (ref 0.0–0.2)

## 2023-10-23 LAB — CMP (CANCER CENTER ONLY)
ALT: 11 U/L (ref 0–44)
AST: 14 U/L — ABNORMAL LOW (ref 15–41)
Albumin: 4.2 g/dL (ref 3.5–5.0)
Alkaline Phosphatase: 40 U/L (ref 38–126)
Anion gap: 9 (ref 5–15)
BUN: 19 mg/dL (ref 8–23)
CO2: 27 mmol/L (ref 22–32)
Calcium: 9.5 mg/dL (ref 8.9–10.3)
Chloride: 106 mmol/L (ref 98–111)
Creatinine: 0.91 mg/dL (ref 0.44–1.00)
GFR, Estimated: >60 mL/min (ref >60)
Glucose, Bld: 116 mg/dL — ABNORMAL HIGH (ref 70–99)
Potassium: 3.9 mmol/L (ref 3.5–5.1)
Sodium: 142 mmol/L (ref 135–145)
Total Bilirubin: 0.5 mg/dL (ref 0.0–1.2)
Total Protein: 6.5 g/dL (ref 6.5–8.1)

## 2023-10-23 LAB — LACTATE DEHYDROGENASE: LDH: 172 U/L (ref 98–192)

## 2023-10-23 MED ORDER — PALONOSETRON HCL INJECTION 0.25 MG/5ML
0.2500 mg | Freq: Once | INTRAVENOUS | Status: AC
  Administered 2023-10-23: 0.25 mg via INTRAVENOUS
  Filled 2023-10-23: qty 5

## 2023-10-23 MED ORDER — SODIUM CHLORIDE 0.9 % IV SOLN: INTRAVENOUS | Status: DC

## 2023-10-23 MED ORDER — SODIUM CHLORIDE 0.9% FLUSH
10.0000 mL | INTRAVENOUS | Status: DC | PRN
  Administered 2023-10-23: 10 mL

## 2023-10-23 MED ORDER — HEPARIN SOD (PORK) LOCK FLUSH 100 UNIT/ML IV SOLN
500.0000 [IU] | Freq: Once | INTRAVENOUS | Status: AC | PRN
  Administered 2023-10-23: 500 [IU]

## 2023-10-23 MED ORDER — SODIUM CHLORIDE 0.9 % IV SOLN
67.5000 mg/m2 | Freq: Once | INTRAVENOUS | Status: AC
  Administered 2023-10-23: 131 mg via INTRAVENOUS
  Filled 2023-10-23: qty 13.1

## 2023-10-23 MED ORDER — SODIUM CHLORIDE 0.9 % IV SOLN
600.0000 mg/m2 | Freq: Once | INTRAVENOUS | Status: AC
  Administered 2023-10-23: 1160 mg via INTRAVENOUS
  Filled 2023-10-23: qty 50

## 2023-10-23 MED ORDER — DEXAMETHASONE SODIUM PHOSPHATE 10 MG/ML IJ SOLN
10.0000 mg | Freq: Once | INTRAMUSCULAR | Status: AC
  Administered 2023-10-23: 10 mg via INTRAVENOUS
  Filled 2023-10-23: qty 1

## 2023-10-23 NOTE — Progress Notes (Signed)
 [facility] CSW Progress Note  Visual merchandiser met with patient to follow-up on emotional support.    Interventions: Provided brief mental health counseling with regard to adjusting to illness.  Patient has an excellent support system.  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       Follow Up Plan:  CSW will follow-up with patient by phone     Macario CHRISTELLA Au, LCSW Clinical Social Worker [facility]

## 2023-10-23 NOTE — Patient Instructions (Signed)

## 2023-10-23 NOTE — Progress Notes (Unsigned)
 Patient is here to begin systemic treatment. She will receive four cycles, restaging and then hopefully surgery. She has a few questions that I was able to answer to her satisfaction. She know the process if she has any questions or concerns after today.   Oncology Nurse Navigator Documentation     10/23/2023    9:30 AM  Oncology Nurse Navigator Flowsheets  Phase of Treatment Chemotherapy  Chemotherapy Actual Start Date: 10/23/2023  Chemotherapy Expected End Date: 12/25/2023  Navigator Follow Up Date: 11/13/2023  Navigator Follow Up Reason: Follow-up Appointment;Chemotherapy  Navigator Radiographer, therapeutic Encounter Type Treatment  Patient Visit Type MedOnc  Treatment Phase First Chemo Tx  Barriers/Navigation Needs Coordination of Care;Education  Education Pain/ Symptom Management;Preparing for Upcoming Surgery/ Treatment  Interventions Education;Psycho-Social Support  Acuity Level 2-Minimal Needs (1-2 Barriers Identified)  Education Method Verbal  Support Groups/Services Friends and Family  Time Spent with Patient 30

## 2023-10-23 NOTE — Progress Notes (Signed)
 Hematology and Oncology Follow Up Visit  [name] [id number] [birth year] 73 y.o. 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 Plan is for neoadjuvant chemotherapy.  She has talked with Dr. Ebbie of Surgical Oncology.  He felt that she would respond much better to chemotherapy and not endocrine therapy.  Verzenio  has caused a lot of nausea and lack of appetite.   She is here today to start her 4 planned cycles of Taxotere /Cytoxan . She is nervous. She is here with her daughter in law.   The only new symptoms that she has had since her last visit is mild bilateral edema of her feet. Possible increase in salt intake but no other potential causes. No abdominal distention, or SOB. No rash, fevers.   Since her last visit she has had a port-a-cath placed. This went well. She has had chemotherapy education.   Currently, I would have to say that her performance status is probably ECOG 1.  Wt Readings from Last 3 Encounters:  10/23/23 183 lb 1.9 oz (83.1 kg)  10/21/23 181 lb 14.1 oz (82.5 kg)  10/09/23 175 lb (79.4 kg)    Medications:  Current Outpatient Medications:    Calcium Carbonate-Vitamin D 600-200 MG-UNIT TABS, Take by mouth daily., Disp: , Rfl:    cholecalciferol (VITAMIN D3) 25 MCG (1000 UNIT) tablet, Take 1,000 Units by mouth daily., Disp: , Rfl:    ciprofloxacin  (CIPRO ) 500 MG tablet, Take 1 tablet (500 mg total) by mouth daily with breakfast. Please start the pills the day after each chemotherapy session., Disp: 14 tablet, Rfl: 3   citalopram  (CELEXA ) 10 MG tablet, Take 1 tablet (10 mg total) by mouth daily., Disp: 90 tablet, Rfl: 0   Collagen-Vitamin  C-Biotin (COLLAGEN PO), Take by mouth daily., Disp: , Rfl:    dexamethasone  (DECADRON ) 4 MG tablet, Take 2 tabs by mouth 2 times daily starting day before chemo. Then take 2 tabs daily for 2 days starting day after chemo. Take with food., Disp: 30 tablet, Rfl: 1   ELDERBERRY PO, Take 1 tablet by mouth daily., Disp: , Rfl:    losartan  (COZAAR ) 50 MG tablet, TAKE 1 TABLET BY MOUTH EVERY DAY, Disp: 90 tablet, Rfl: 0   Multiple Vitamins-Minerals (MULTIVITAMIN WITH MINERALS) tablet, Take by mouth., Disp: , Rfl:    ondansetron  (ZOFRAN ) 8 MG tablet, Take 1 tablet (8 mg total) by mouth every 8 (eight) hours as needed for nausea or vomiting., Disp: 30 tablet, Rfl: 2   prochlorperazine  (COMPAZINE ) 10 MG tablet, Take 1 tablet (10 mg total) by mouth every 6 (six) hours as needed for nausea or vomiting., Disp: 30 tablet, Rfl: 1   TURMERIC PO, Take by mouth daily., Disp: , Rfl:    vitamin B-12 (CYANOCOBALAMIN) 1000 MCG tablet, Take by mouth daily., Disp: , Rfl:    ondansetron  (ZOFRAN ) 8 MG tablet, Take 1 tablet (8 mg total) by mouth every 8 (eight) hours as needed for nausea or vomiting. Start on the third day after chemotherapy., Disp: 30 tablet, Rfl: 1 No current facility-administered medications for this visit.  Facility-Administered Medications Ordered in Other Visits:    0.9 %  sodium chloride  infusion, , Intravenous, Continuous, Ennever, Maude SAUNDERS, MD, Last Rate: 10 mL/hr at  10/23/23 1043, New Bag at 10/23/23 1043   cyclophosphamide  (CYTOXAN ) 1,160 mg in sodium chloride  0.9 % 250 mL chemo infusion, 600 mg/m2 (Treatment Plan Recorded), Intravenous, Once, Ennever, Maude SAUNDERS, MD   DOCEtaxel  (TAXOTERE ) 131 mg in sodium chloride  0.9 % 250 mL chemo infusion, 67.5 mg/m2 (Treatment Plan Recorded), Intravenous, Once, Timmy Maude SAUNDERS, MD, Last Rate: 66 mL/hr at 10/23/23 1148, 131 mg at 10/23/23 1148   heparin  lock flush 100 unit/mL, 500 Units, Intracatheter, Once PRN, Ennever, Maude SAUNDERS, MD   sodium chloride  flush (NS)  0.9 % injection 10 mL, 10 mL, Intracatheter, PRN, Ennever, Peter R, MD  Allergies:  Allergies  Allergen Reactions   Sulfa Antibiotics Rash    Past Medical History, Surgical history, Social history, and Family History were reviewed and updated.  Review of Systems: Review of Systems  Constitutional: Negative.   HENT:  Negative.  Negative for hearing loss.   Eyes: Negative.   Respiratory: Negative.    Cardiovascular: Negative.   Gastrointestinal: Negative.   Endocrine: Negative.   Genitourinary: Negative.    Musculoskeletal: Negative.   Skin: Negative.   Neurological: Negative.   Hematological: Negative.   Psychiatric/Behavioral: Negative.      Physical Exam:  Vitals:   10/23/23 0942  BP: (!) 152/83  Pulse: 69  Resp: 18  Temp: 98.3 F (36.8 C)  SpO2: 97%     Wt Readings from Last 3 Encounters:  10/23/23 183 lb 1.9 oz (83.1 kg)  10/21/23 181 lb 14.1 oz (82.5 kg)  10/09/23 175 lb (79.4 kg)    Physical Exam Vitals reviewed.  HENT:     Head: Normocephalic and atraumatic.  Eyes:     Pupils: Pupils are equal, round, and reactive to light.  Cardiovascular:     Rate and Rhythm: Normal rate and regular rhythm.     Heart sounds: Normal heart sounds.  Pulmonary:     Effort: Pulmonary effort is normal.     Breath sounds: Normal breath sounds.  Abdominal:     General: Bowel sounds are normal.     Palpations: Abdomen is soft.  Musculoskeletal:        General: No tenderness or deformity. Normal range of motion.     Cervical back: Normal range of motion.  Lymphadenopathy:     Cervical: No cervical adenopathy.  Skin:    General: Skin is warm and dry.     Findings: No erythema or rash.  Neurological:     Mental Status: She is alert and oriented to person, place, and time.  Psychiatric:        Behavior: Behavior normal.        Thought Content: Thought content normal.        Judgment: Judgment normal.      Lab Results  Component Value Date   WBC 7.5  10/23/2023   HGB 11.4 (L) 10/23/2023   HCT 32.9 (L) 10/23/2023   MCV 93.2 10/23/2023   PLT 116 (L) 10/23/2023     Chemistry      Component Value Date/Time   NA 142 10/23/2023 0930   NA 142 02/02/2015 0000   K 3.9 10/23/2023 0930   CL 106 10/23/2023 0930   CO2 27 10/23/2023 0930   BUN 19 10/23/2023 0930   BUN 13 02/02/2015 0000   CREATININE 0.91 10/23/2023 0930   GLU 101 02/02/2015 0000      Component Value Date/Time   CALCIUM 9.5 10/23/2023 0930   ALKPHOS 40 10/23/2023 0930   AST  14 (L) 10/23/2023 0930   ALT 11 10/23/2023 0930   BILITOT 0.5 10/23/2023 0930     Encounter Diagnoses  Name Primary?   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 Beaudin is a very charming 73 year old postmenopausal white female. She has locally advanced infiltrating ductal carcinoma of the right breast- ER+/PR+/HER2- --- Oncotype =30. Plan is for neoadjuvant chemotherapy with Taxotere /Cytoxan . 4 cycles planned. She has had chemo education. We reviewed common side effects and precautions today this included items such as rash, mouth ulcers, fatigue, nausea, vomiting, diarrhea, constipation, changes in blood cell counts, changes in liver/kidney function, rash, edema, visual changes, weight loss, changes in taste, loss of hair. We discussed red flags including significant changes to health such as SOB, significant edema, fevers.   Dr. Timmy has her on prophylactic Cipro  following chemotherapy. She will also have G-CSF coverage.   Today her CBC and CMP are acceptable for treatment. We will need to monitor her platelets and Hgb closely.   She will start using compression socks and monitor her edema.   Disposition Cycle 1 Taxotere /cytoxan  today RTC Friday Injection (GCSF) RTC 1 week tolerance check APP, port labs (CBC w/, CMP), +_  IVF RTC 3 weeks MD, port labs, Day 1 Cycle 2 Taxotere /Cytoxan    I spent 40 minutes dedicated to the care of this patient (face to face and non-face to face) on the date of this encounter to include the discussions stated above.    Lauraine CHRISTELLA Dais, PA-C 7/9/202511:48 AM

## 2023-10-23 NOTE — Patient Instructions (Signed)
 [facility] - A [facility] OF [facility]  Discharge Instructions: Thank you for choosing [facility] to provide your oncology and hematology care.   If you have a lab appointment with the Cancer Center, please go directly to the Cancer Center and check in at the registration area.  Wear comfortable clothing and clothing appropriate for easy access to any Portacath or PICC line.   We strive to give you quality time with your provider. You may need to reschedule your appointment if you arrive late (15 or more minutes).  Arriving late affects you and other patients whose appointments are after yours.  Also, if you miss three or more appointments without notifying the office, you may be dismissed from the clinic at the provider's discretion.      For prescription refill requests, have your pharmacy contact our office and allow 72 hours for refills to be completed.    Today you received the following chemotherapy and/or immunotherapy agents Taxotere/Cytoxan      To help prevent nausea and vomiting after your treatment, we encourage you to take your nausea medication as directed.  BELOW ARE SYMPTOMS THAT SHOULD BE REPORTED IMMEDIATELY: *FEVER GREATER THAN 100.4 F (38 C) OR HIGHER *CHILLS OR SWEATING *NAUSEA AND VOMITING THAT IS NOT CONTROLLED WITH YOUR NAUSEA MEDICATION *UNUSUAL SHORTNESS OF BREATH *UNUSUAL BRUISING OR BLEEDING *URINARY PROBLEMS (pain or burning when urinating, or frequent urination) *BOWEL PROBLEMS (unusual diarrhea, constipation, pain near the anus) TENDERNESS IN MOUTH AND THROAT WITH OR WITHOUT PRESENCE OF ULCERS (sore throat, sores in mouth, or a toothache) UNUSUAL RASH, SWELLING OR PAIN  UNUSUAL VAGINAL DISCHARGE OR ITCHING   Items with * indicate a potential emergency and should be followed up as soon as possible or go to the Emergency Department if any problems should occur.  Please show the CHEMOTHERAPY ALERT CARD or  IMMUNOTHERAPY ALERT CARD at check-in to the Emergency Department and triage nurse. Should you have questions after your visit or need to cancel or reschedule your appointment, please contact [facility] - A DEPT OF [facility]  [phone] and follow the prompts.  Office hours are 8:00 a.m. to 4:30 p.m. Monday - Friday. Please note that voicemails left after 4:00 p.m. may not be returned until the following business day.  We are closed weekends and major holidays. You have access to a nurse at all times for urgent questions. Please call the main number to the clinic [phone] and follow the prompts.  For any non-urgent questions, you may also contact your provider using MyChart. We now offer e-Visits for anyone 40 and older to request care online for non-urgent symptoms. For details visit [email or web address].   Also download the MyChart app! Go to the app store, search "MyChart", open the app, select [place], and log in with your MyChart username and password.

## 2023-10-24 ENCOUNTER — Encounter: Payer: Self-pay | Admitting: Hematology & Oncology

## 2023-10-25 ENCOUNTER — Inpatient Hospital Stay

## 2023-10-25 VITALS — BP 133/77 | HR 60 | Temp 98.3°F | Resp 18

## 2023-10-25 DIAGNOSIS — C50911 Malignant neoplasm of unspecified site of right female breast: Secondary | ICD-10-CM

## 2023-10-25 DIAGNOSIS — Z5111 Encounter for antineoplastic chemotherapy: Secondary | ICD-10-CM | POA: Diagnosis not present

## 2023-10-25 MED ORDER — PEGFILGRASTIM-FPGK 6 MG/0.6ML ~~LOC~~ SOSY
6.0000 mg | PREFILLED_SYRINGE | Freq: Once | SUBCUTANEOUS | Status: AC
Start: 2023-10-25 — End: 2023-10-25
  Administered 2023-10-25: 6 mg via SUBCUTANEOUS
  Filled 2023-10-25: qty 0.6

## 2023-10-25 NOTE — Patient Instructions (Signed)

## 2023-10-28 ENCOUNTER — Encounter: Payer: Self-pay | Admitting: *Deleted

## 2023-10-30 ENCOUNTER — Inpatient Hospital Stay: Admitting: Medical Oncology

## 2023-10-30 ENCOUNTER — Inpatient Hospital Stay

## 2023-10-30 VITALS — BP 101/53 | HR 85 | Temp 99.8°F | Resp 18

## 2023-10-30 DIAGNOSIS — Z170 Estrogen receptor positive status [ER+]: Secondary | ICD-10-CM | POA: Diagnosis not present

## 2023-10-30 DIAGNOSIS — C50911 Malignant neoplasm of unspecified site of right female breast: Secondary | ICD-10-CM

## 2023-10-30 DIAGNOSIS — Z5111 Encounter for antineoplastic chemotherapy: Secondary | ICD-10-CM | POA: Diagnosis not present

## 2023-10-30 DIAGNOSIS — C773 Secondary and unspecified malignant neoplasm of axilla and upper limb lymph nodes: Secondary | ICD-10-CM

## 2023-10-30 DIAGNOSIS — C50011 Malignant neoplasm of nipple and areola, right female breast: Secondary | ICD-10-CM

## 2023-10-30 DIAGNOSIS — E860 Dehydration: Secondary | ICD-10-CM

## 2023-10-30 LAB — CMP (CANCER CENTER ONLY)
ALT: 11 U/L (ref 0–44)
AST: 14 U/L — ABNORMAL LOW (ref 15–41)
Albumin: 3.7 g/dL (ref 3.5–5.0)
Alkaline Phosphatase: 41 U/L (ref 38–126)
Anion gap: 10 (ref 5–15)
BUN: 16 mg/dL (ref 8–23)
CO2: 26 mmol/L (ref 22–32)
Calcium: 9 mg/dL (ref 8.9–10.3)
Chloride: 100 mmol/L (ref 98–111)
Creatinine: 1.25 mg/dL — ABNORMAL HIGH (ref 0.44–1.00)
GFR, Estimated: 46 mL/min — ABNORMAL LOW (ref >60)
Glucose, Bld: 95 mg/dL (ref 70–99)
Potassium: 4 mmol/L (ref 3.5–5.1)
Sodium: 136 mmol/L (ref 135–145)
Total Bilirubin: 0.8 mg/dL (ref 0.0–1.2)
Total Protein: 5.9 g/dL — ABNORMAL LOW (ref 6.5–8.1)

## 2023-10-30 LAB — CBC WITH DIFFERENTIAL (CANCER CENTER ONLY)
Abs Immature Granulocytes: 0.38 K/uL — ABNORMAL HIGH (ref 0.00–0.07)
Basophils Absolute: 0.1 K/uL (ref 0.0–0.1)
Basophils Relative: 2 %
Eosinophils Absolute: 0 K/uL (ref 0.0–0.5)
Eosinophils Relative: 0 %
HCT: 34 % — ABNORMAL LOW (ref 36.0–46.0)
Hemoglobin: 11.8 g/dL — ABNORMAL LOW (ref 12.0–15.0)
Immature Granulocytes: 7 %
Lymphocytes Relative: 24 %
Lymphs Abs: 1.3 K/uL (ref 0.7–4.0)
MCH: 32 pg (ref 26.0–34.0)
MCHC: 34.7 g/dL (ref 30.0–36.0)
MCV: 92.1 fL (ref 80.0–100.0)
Monocytes Absolute: 1 K/uL (ref 0.1–1.0)
Monocytes Relative: 20 %
Neutro Abs: 2.5 K/uL (ref 1.7–7.7)
Neutrophils Relative %: 47 %
Platelet Count: 161 K/uL (ref 150–400)
RBC: 3.69 MIL/uL — ABNORMAL LOW (ref 3.87–5.11)
RDW: 12.4 % (ref 11.5–15.5)
WBC Count: 5.3 K/uL (ref 4.0–10.5)
nRBC: 0.8 % — ABNORMAL HIGH (ref 0.0–0.2)

## 2023-10-30 MED ORDER — SODIUM CHLORIDE 0.9% FLUSH: 10.0000 mL | Freq: Once | INTRAVENOUS | Status: DC

## 2023-10-30 MED ORDER — SODIUM CHLORIDE 0.9% FLUSH
10.0000 mL | Freq: Once | INTRAVENOUS | Status: AC
  Administered 2023-10-30: 10 mL via INTRAVENOUS

## 2023-10-30 MED ORDER — SODIUM CHLORIDE 0.9 % IV SOLN: INTRAVENOUS | Status: AC

## 2023-10-30 MED ORDER — HEPARIN SOD (PORK) LOCK FLUSH 100 UNIT/ML IV SOLN
500.0000 [IU] | Freq: Once | INTRAVENOUS | Status: AC
  Administered 2023-10-30: 500 [IU] via INTRAVENOUS

## 2023-10-30 NOTE — Patient Instructions (Signed)

## 2023-10-30 NOTE — Progress Notes (Signed)
 Hematology and Oncology Follow Up Visit  [name] [id number] [birth year] 73 y.o. 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 Plan is for neoadjuvant chemotherapy.  She has talked with Dr. Ebbie of Surgical Oncology.  He felt that she would respond much better to chemotherapy and not endocrine therapy.  Today she is here for tolerance check following her first cycle of Taxotere /Cytoxan . She is here with her granddaughter.   She reports that overall she has been doing ok. She has had some diarrhea, nausea, low grade fevers as well as a sore throat that has resolved and ear pain. She denies hearing changes, rash, abdominal pain or bleeding. She is taking Ciprofloxacin  as prescribed without known issues.   Her mild peripheral edema has resolved.   She is down in weight but has been able to eat and drink despite food and drinks not tasting like normal.   Currently, I would have to say that her performance status is probably ECOG 1.  Wt Readings from Last 3 Encounters:  10/23/23 183 lb 1.9 oz (83.1 kg)  10/21/23 181 lb 14.1 oz (82.5 kg)  10/09/23 175 lb (79.4 kg)    Medications:  Current Outpatient Medications:    Calcium Carbonate-Vitamin D 600-200 MG-UNIT TABS, Take by mouth daily., Disp: , Rfl:    cholecalciferol (VITAMIN D3) 25 MCG (1000 UNIT) tablet, Take 1,000 Units by mouth daily., Disp: , Rfl:    ciprofloxacin  (CIPRO ) 500 MG tablet, Take 1 tablet (500 mg total) by mouth daily with breakfast. Please start the pills the day after each chemotherapy session., Disp: 14 tablet, Rfl: 3   citalopram  (CELEXA ) 10 MG tablet, Take 1 tablet (10 mg total) by mouth daily.,  Disp: 90 tablet, Rfl: 0   Collagen-Vitamin C-Biotin (COLLAGEN PO), Take by mouth daily., Disp: , Rfl:    dexamethasone  (DECADRON ) 4 MG tablet, Take 2 tabs by mouth 2 times daily starting day before chemo. Then take 2 tabs daily for 2 days starting day after chemo. Take with food., Disp: 30 tablet, Rfl: 1   ELDERBERRY PO, Take 1 tablet by mouth daily., Disp: , Rfl:    losartan  (COZAAR ) 50 MG tablet, TAKE 1 TABLET BY MOUTH EVERY DAY, Disp: 90 tablet, Rfl: 0   Multiple Vitamins-Minerals (MULTIVITAMIN WITH MINERALS) tablet, Take by mouth., Disp: , Rfl:    ondansetron  (ZOFRAN ) 8 MG tablet, Take 1 tablet (8 mg total) by mouth every 8 (eight) hours as needed for nausea or vomiting., Disp: 30 tablet, Rfl: 2   prochlorperazine  (COMPAZINE ) 10 MG tablet, Take 1 tablet (10 mg total) by mouth every 6 (six) hours as needed for nausea or vomiting., Disp: 30 tablet, Rfl: 1   TURMERIC PO, Take by mouth daily., Disp: , Rfl:    vitamin B-12 (CYANOCOBALAMIN) 1000 MCG tablet, Take by mouth daily., Disp: , Rfl:  No current facility-administered medications for this visit.  Facility-Administered Medications Ordered in Other Visits:    heparin  lock flush 100 unit/mL, 500 Units, Intravenous, Once, Ennever, Maude SAUNDERS, MD   sodium chloride  flush (NS) 0.9 % injection 10 mL, 10 mL, Intravenous, Once, Ennever, Maude SAUNDERS, MD  Allergies:  Allergies  Allergen Reactions   Sulfa Antibiotics Rash    Past Medical History,  Surgical history, Social history, and Family History were reviewed and updated.  Review of Systems: Review of Systems  Constitutional: Negative.   HENT:  Negative.  Negative for hearing loss.   Eyes: Negative.   Respiratory: Negative.    Cardiovascular: Negative.   Gastrointestinal: Negative.   Endocrine: Negative.   Genitourinary: Negative.    Musculoskeletal: Negative.   Skin: Negative.   Neurological: Negative.   Hematological: Negative.   Psychiatric/Behavioral: Negative.      Physical  Exam:  Vitals:   10/30/23 1403  BP: (!) 101/53  Pulse: 85  Resp: 18  Temp: 99.8 F (37.7 C)  SpO2: 96%     Wt Readings from Last 3 Encounters:  10/23/23 183 lb 1.9 oz (83.1 kg)  10/21/23 181 lb 14.1 oz (82.5 kg)  10/09/23 175 lb (79.4 kg)    Physical Exam Vitals reviewed.  HENT:     Head: Normocephalic and atraumatic.  Eyes:     Pupils: Pupils are equal, round, and reactive to light.  Cardiovascular:     Rate and Rhythm: Normal rate and regular rhythm.     Heart sounds: Normal heart sounds.  Pulmonary:     Effort: Pulmonary effort is normal.     Breath sounds: Normal breath sounds.  Abdominal:     General: Bowel sounds are normal.     Palpations: Abdomen is soft.  Musculoskeletal:        General: No tenderness or deformity. Normal range of motion.     Cervical back: Normal range of motion.  Lymphadenopathy:     Cervical: No cervical adenopathy.  Skin:    General: Skin is warm and dry.     Findings: No erythema or rash.  Neurological:     Mental Status: She is alert and oriented to person, place, and time.  Psychiatric:        Behavior: Behavior normal.        Thought Content: Thought content normal.        Judgment: Judgment normal.      Lab Results  Component Value Date   WBC 5.3 10/30/2023   HGB 11.8 (L) 10/30/2023   HCT 34.0 (L) 10/30/2023   MCV 92.1 10/30/2023   PLT 161 10/30/2023     Chemistry      Component Value Date/Time   NA 136 10/30/2023 1300   NA 142 02/02/2015 0000   K 4.0 10/30/2023 1300   CL 100 10/30/2023 1300   CO2 26 10/30/2023 1300   BUN 16 10/30/2023 1300   BUN 13 02/02/2015 0000   CREATININE 1.25 (H) 10/30/2023 1300   GLU 101 02/02/2015 0000      Component Value Date/Time   CALCIUM 9.0 10/30/2023 1300   ALKPHOS 41 10/30/2023 1300   AST 14 (L) 10/30/2023 1300   ALT 11 10/30/2023 1300   BILITOT 0.8 10/30/2023 1300     Encounter Diagnoses  Name Primary?   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 She has locally advanced infiltrating ductal carcinoma of the right breast- ER+/PR+/HER2- --- Oncotype =30. Plan is for neoadjuvant chemotherapy with Taxotere /Cytoxan . 4 cycles planned.   Today her labs look good overall. We will give her 500 ml of IVF to help with her mildly elevated creatinine. I suspect oral  intake is down a bit from taste changes. Hydration encouraged.   Dr. Timmy has her on prophylactic Cipro  following chemotherapy which she will continue. She will also have G-CSF coverage. We reviewed red flag signs and symptoms.   Disposition  RTC 2 weeks MD, port labs, Day 1 Cycle 2 Taxotere /Cytoxan  (as previously scheduled)  Lauraine CHRISTELLA Dais, PA-C 7/16/20252:18 PM

## 2023-10-31 ENCOUNTER — Ambulatory Visit: Admitting: Hematology & Oncology

## 2023-10-31 ENCOUNTER — Inpatient Hospital Stay

## 2023-11-06 ENCOUNTER — Encounter: Payer: Self-pay | Admitting: *Deleted

## 2023-11-13 ENCOUNTER — Inpatient Hospital Stay

## 2023-11-13 ENCOUNTER — Inpatient Hospital Stay: Admitting: Family

## 2023-11-13 ENCOUNTER — Inpatient Hospital Stay: Admitting: Licensed Clinical Social Worker

## 2023-11-13 ENCOUNTER — Encounter: Payer: Self-pay | Admitting: Family

## 2023-11-13 VITALS — BP 145/77 | HR 67

## 2023-11-13 VITALS — BP 122/67 | HR 77 | Temp 98.3°F | Resp 16 | Ht 67.0 in | Wt 175.0 lb

## 2023-11-13 DIAGNOSIS — D649 Anemia, unspecified: Secondary | ICD-10-CM

## 2023-11-13 DIAGNOSIS — C773 Secondary and unspecified malignant neoplasm of axilla and upper limb lymph nodes: Secondary | ICD-10-CM | POA: Diagnosis not present

## 2023-11-13 DIAGNOSIS — Z5111 Encounter for antineoplastic chemotherapy: Secondary | ICD-10-CM | POA: Diagnosis not present

## 2023-11-13 DIAGNOSIS — C50911 Malignant neoplasm of unspecified site of right female breast: Secondary | ICD-10-CM | POA: Diagnosis not present

## 2023-11-13 LAB — CBC WITH DIFFERENTIAL (CANCER CENTER ONLY)
Abs Immature Granulocytes: 0.05 K/uL (ref 0.00–0.07)
Basophils Absolute: 0 K/uL (ref 0.0–0.1)
Basophils Relative: 0 %
Eosinophils Absolute: 0 K/uL (ref 0.0–0.5)
Eosinophils Relative: 0 %
HCT: 33.2 % — ABNORMAL LOW (ref 36.0–46.0)
Hemoglobin: 11.6 g/dL — ABNORMAL LOW (ref 12.0–15.0)
Immature Granulocytes: 0 %
Lymphocytes Relative: 7 %
Lymphs Abs: 0.9 K/uL (ref 0.7–4.0)
MCH: 32.6 pg (ref 26.0–34.0)
MCHC: 34.9 g/dL (ref 30.0–36.0)
MCV: 93.3 fL (ref 80.0–100.0)
Monocytes Absolute: 0.5 K/uL (ref 0.1–1.0)
Monocytes Relative: 4 %
Neutro Abs: 11.2 K/uL — ABNORMAL HIGH (ref 1.7–7.7)
Neutrophils Relative %: 89 %
Platelet Count: 269 K/uL (ref 150–400)
RBC: 3.56 MIL/uL — ABNORMAL LOW (ref 3.87–5.11)
RDW: 13.3 % (ref 11.5–15.5)
WBC Count: 12.7 K/uL — ABNORMAL HIGH (ref 4.0–10.5)
nRBC: 0 % (ref 0.0–0.2)

## 2023-11-13 LAB — CMP (CANCER CENTER ONLY)
ALT: 14 U/L (ref 0–44)
AST: 19 U/L (ref 15–41)
Albumin: 4.1 g/dL (ref 3.5–5.0)
Alkaline Phosphatase: 48 U/L (ref 38–126)
Anion gap: 13 (ref 5–15)
BUN: 15 mg/dL (ref 8–23)
CO2: 22 mmol/L (ref 22–32)
Calcium: 9.7 mg/dL (ref 8.9–10.3)
Chloride: 106 mmol/L (ref 98–111)
Creatinine: 0.85 mg/dL (ref 0.44–1.00)
GFR, Estimated: >60 mL/min (ref >60)
Glucose, Bld: 124 mg/dL — ABNORMAL HIGH (ref 70–99)
Potassium: 3.7 mmol/L (ref 3.5–5.1)
Sodium: 141 mmol/L (ref 135–145)
Total Bilirubin: 0.4 mg/dL (ref 0.0–1.2)
Total Protein: 6.4 g/dL — ABNORMAL LOW (ref 6.5–8.1)

## 2023-11-13 LAB — IRON AND IRON BINDING CAPACITY (CC-WL,HP ONLY)
Iron: 122 ug/dL (ref 28–170)
Saturation Ratios: 36 % — ABNORMAL HIGH (ref 10.4–31.8)
TIBC: 337 ug/dL (ref 250–450)
UIBC: 215 ug/dL

## 2023-11-13 LAB — FERRITIN: Ferritin: 573 ng/mL — ABNORMAL HIGH (ref 11–307)

## 2023-11-13 MED ORDER — SODIUM CHLORIDE 0.9% FLUSH
10.0000 mL | INTRAVENOUS | Status: DC | PRN
  Administered 2023-11-13: 10 mL

## 2023-11-13 MED ORDER — HEPARIN SOD (PORK) LOCK FLUSH 100 UNIT/ML IV SOLN
500.0000 [IU] | Freq: Once | INTRAVENOUS | Status: AC | PRN
  Administered 2023-11-13: 500 [IU]

## 2023-11-13 MED ORDER — SODIUM CHLORIDE 0.9 % IV SOLN
67.5000 mg/m2 | Freq: Once | INTRAVENOUS | Status: AC
  Administered 2023-11-13: 131 mg via INTRAVENOUS
  Filled 2023-11-13: qty 13.1

## 2023-11-13 MED ORDER — DEXAMETHASONE SODIUM PHOSPHATE 10 MG/ML IJ SOLN
10.0000 mg | Freq: Once | INTRAMUSCULAR | Status: AC
  Administered 2023-11-13: 10 mg via INTRAVENOUS
  Filled 2023-11-13: qty 1

## 2023-11-13 MED ORDER — SODIUM CHLORIDE 0.9 % IV SOLN: INTRAVENOUS | Status: DC

## 2023-11-13 MED ORDER — PALONOSETRON HCL INJECTION 0.25 MG/5ML
0.2500 mg | Freq: Once | INTRAVENOUS | Status: AC
  Administered 2023-11-13: 0.25 mg via INTRAVENOUS
  Filled 2023-11-13: qty 5

## 2023-11-13 MED ORDER — SODIUM CHLORIDE 0.9 % IV SOLN
600.0000 mg/m2 | Freq: Once | INTRAVENOUS | Status: AC
  Administered 2023-11-13: 1160 mg via INTRAVENOUS
  Filled 2023-11-13: qty 50

## 2023-11-13 NOTE — Patient Instructions (Signed)
 [facility] - A [facility] OF [facility]  Discharge Instructions: Thank you for choosing [facility] to provide your oncology and hematology care.   If you have a lab appointment with the Cancer Center, please go directly to the Cancer Center and check in at the registration area.  Wear comfortable clothing and clothing appropriate for easy access to any Portacath or PICC line.   We strive to give you quality time with your provider. You may need to reschedule your appointment if you arrive late (15 or more minutes).  Arriving late affects you and other patients whose appointments are after yours.  Also, if you miss three or more appointments without notifying the office, you may be dismissed from the clinic at the provider's discretion.      For prescription refill requests, have your pharmacy contact our office and allow 72 hours for refills to be completed.    Today you received the following chemotherapy and/or immunotherapy agents Taxotere/Cytoxan      To help prevent nausea and vomiting after your treatment, we encourage you to take your nausea medication as directed.  BELOW ARE SYMPTOMS THAT SHOULD BE REPORTED IMMEDIATELY: *FEVER GREATER THAN 100.4 F (38 C) OR HIGHER *CHILLS OR SWEATING *NAUSEA AND VOMITING THAT IS NOT CONTROLLED WITH YOUR NAUSEA MEDICATION *UNUSUAL SHORTNESS OF BREATH *UNUSUAL BRUISING OR BLEEDING *URINARY PROBLEMS (pain or burning when urinating, or frequent urination) *BOWEL PROBLEMS (unusual diarrhea, constipation, pain near the anus) TENDERNESS IN MOUTH AND THROAT WITH OR WITHOUT PRESENCE OF ULCERS (sore throat, sores in mouth, or a toothache) UNUSUAL RASH, SWELLING OR PAIN  UNUSUAL VAGINAL DISCHARGE OR ITCHING   Items with * indicate a potential emergency and should be followed up as soon as possible or go to the Emergency Department if any problems should occur.  Please show the CHEMOTHERAPY ALERT CARD or  IMMUNOTHERAPY ALERT CARD at check-in to the Emergency Department and triage nurse. Should you have questions after your visit or need to cancel or reschedule your appointment, please contact [facility] - A DEPT OF [facility]  [phone] and follow the prompts.  Office hours are 8:00 a.m. to 4:30 p.m. Monday - Friday. Please note that voicemails left after 4:00 p.m. may not be returned until the following business day.  We are closed weekends and major holidays. You have access to a nurse at all times for urgent questions. Please call the main number to the clinic [phone] and follow the prompts.  For any non-urgent questions, you may also contact your provider using MyChart. We now offer e-Visits for anyone 40 and older to request care online for non-urgent symptoms. For details visit [email or web address].   Also download the MyChart app! Go to the app store, search "MyChart", open the app, select [place], and log in with your MyChart username and password.

## 2023-11-13 NOTE — Patient Instructions (Signed)

## 2023-11-13 NOTE — Progress Notes (Signed)
 [facility] CSW Progress Note  Visual merchandiser met with patient to follow-up on emotional support.    Interventions: Patient interviewed and appropriate assessments performed: brief mental health assessment  Patient observed participating in art program with volunteer.  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  She appeared to respond positively to social engagement.  No needs at this time.      Follow Up Plan:  Patient will contact CSW with any support or resource needs    Macario CHRISTELLA Au, LCSW Clinical Social Worker [facility]

## 2023-11-13 NOTE — Progress Notes (Signed)
 Hematology and Oncology Follow Up Visit  [name] [id number] 24-Mar-1951 73 y.o. 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 She is feeling better since stopping the Cipro . This was causing her to feel restless, shaky, anxious and caused insomnia. She also notes that her nose bleeds have stopped. No other blood loss noted. No abnormal bruising, no petechiae.  She is only sleeping 3-4 hrs a night and non during the day. Melatonin has not helped in the past.  No changes to breast exam per patient.  No adenopathy or lymphedema.  No fever, chills, n/v, cough, rash, dizziness, SOB, chest pain, palpitations, abdominal pain or changes in bowel or bladder habits.  No swelling, tenderness, numbness or tingling in her extremities.  No falls or syncope reported.  Appetite and hydration are good. Weight is stable at 175 lbs.  She has noted her nails are thinning. She will try taking a hair and nail vitamin and also try using tea tree oil diluted with water on a cotton ball on her nails daily.   ECOG Performance Status: 1 - Symptomatic but completely ambulatory  Medications:  Allergies as of 11/13/2023       Reactions   Sulfa Antibiotics Rash        Medication List        Accurate as of November 13, 2023 10:08 AM. If you have any questions, ask your nurse or doctor.          Calcium Carbonate-Vitamin D 600-200 MG-UNIT Tabs Take by mouth daily.   cholecalciferol 25 MCG (1000 UNIT) tablet Commonly known as: VITAMIN D3 Take 1,000 Units by mouth daily.   ciprofloxacin  500 MG tablet Commonly known as: Cipro  Take 1 tablet (500 mg total) by mouth  daily with breakfast. Please start the pills the day after each chemotherapy session.   citalopram  10 MG tablet Commonly known as: CELEXA  Take 1 tablet (10 mg total) by mouth daily.   COLLAGEN PO Take by mouth daily. What changed: additional instructions   cyanocobalamin 1000 MCG tablet Commonly known as: VITAMIN B12 Take by mouth daily. What changed: additional instructions   dexamethasone  4 MG tablet Commonly known as: DECADRON  Take 2 tabs by mouth 2 times daily starting day before chemo. Then take 2 tabs daily for 2 days starting day after chemo. Take with food.   ELDERBERRY PO Take 1 tablet by mouth daily. What changed: additional instructions   losartan  50 MG tablet Commonly known as: COZAAR  TAKE 1 TABLET BY MOUTH EVERY DAY   multivitamin with minerals tablet Take by mouth.   ondansetron  8 MG tablet Commonly known as: ZOFRAN  Take 1 tablet (8 mg total) by mouth every 8 (eight) hours as needed for nausea or vomiting.   prochlorperazine  10 MG tablet Commonly known as: COMPAZINE  Take 1 tablet (10 mg total) by mouth every 6 (six) hours as needed for nausea or vomiting.   TURMERIC PO Take by mouth daily. What changed: additional instructions        Allergies:  Allergies  Allergen Reactions   Sulfa Antibiotics Rash    Past Medical History, Surgical history, Social history, and Family History were reviewed and updated.  Review of Systems:  All other 10 point review of systems is negative.   Physical Exam:  height is 5' 7 (1.702 m) and weight is 175 lb (79.4 kg). Her oral temperature is 98.3 F (36.8 C). Her blood pressure is 122/67 and her pulse is 77. Her respiration is 16 and oxygen saturation is 97%.   Wt Readings from Last 3 Encounters:  11/13/23 175 lb (79.4 kg)  10/23/23 183 lb 1.9 oz (83.1 kg)  10/21/23 181 lb 14.1 oz (82.5 kg)    Ocular: Sclerae unicteric, pupils equal, round and reactive to light Ear-nose-throat: Oropharynx clear, dentition  fair Lymphatic: No cervical or supraclavicular adenopathy Lungs no rales or rhonchi, good excursion bilaterally Heart regular rate and rhythm, no murmur appreciated Abd soft, nontender, positive bowel sounds MSK no focal spinal tenderness, no joint edema Neuro: non-focal, well-oriented, appropriate affect Breasts: Deferred   Lab Results  Component Value Date   WBC 12.7 (H) 11/13/2023   HGB 11.6 (L) 11/13/2023   HCT 33.2 (L) 11/13/2023   MCV 93.3 11/13/2023   PLT 269 11/13/2023   No results found for: FERRITIN, IRON, TIBC, UIBC, IRONPCTSAT Lab Results  Component Value Date   RBC 3.56 (L) 11/13/2023   No results found for: KPAFRELGTCHN, LAMBDASER, KAPLAMBRATIO No results found for: IGGSERUM, IGA, IGMSERUM No results found for: STEPHANY CARLOTA BENSON MARKEL EARLA JOANNIE DOC, MSPIKE, SPEI   Chemistry      Component Value Date/Time   NA 136 10/30/2023 1300   NA 142 02/02/2015 0000   K 4.0 10/30/2023 1300   CL 100 10/30/2023 1300   CO2 26 10/30/2023 1300   BUN 16 10/30/2023 1300   BUN 13 02/02/2015 0000   CREATININE 1.25 (H) 10/30/2023 1300   GLU 101 02/02/2015 0000      Component Value Date/Time   CALCIUM 9.0 10/30/2023 1300   ALKPHOS 41 10/30/2023 1300   AST 14 (L) 10/30/2023 1300   ALT 11 10/30/2023 1300   BILITOT 0.8 10/30/2023 1300       Impression and Plan: [name] is a very pleasant 73 yo caucasian postmenopausal female with locally advanced infiltrating ductal carcinoma of the right breast- ER+/PR+/HER2- --- Oncotype =30. Plan is for a total of 4 cycles of neoadjuvant chemotherapy with Taxotere /Cytoxan .  We will proceed with cycle 2 today as planned.  I discussed her issues with Cipro  with Dr. Timmy and she can stop. No new orders at this time.  Follow-up in 3 weeks.   Leslie Pepper, NP 7/30/202510:08 AM

## 2023-11-15 ENCOUNTER — Encounter: Payer: Self-pay | Admitting: Hematology & Oncology

## 2023-11-15 ENCOUNTER — Inpatient Hospital Stay: Attending: Hematology & Oncology

## 2023-11-15 VITALS — BP 135/78 | HR 83 | Temp 98.3°F | Resp 20

## 2023-11-15 DIAGNOSIS — Z5189 Encounter for other specified aftercare: Secondary | ICD-10-CM | POA: Insufficient documentation

## 2023-11-15 DIAGNOSIS — C773 Secondary and unspecified malignant neoplasm of axilla and upper limb lymph nodes: Secondary | ICD-10-CM | POA: Insufficient documentation

## 2023-11-15 DIAGNOSIS — C50811 Malignant neoplasm of overlapping sites of right female breast: Secondary | ICD-10-CM | POA: Diagnosis not present

## 2023-11-15 DIAGNOSIS — Z5111 Encounter for antineoplastic chemotherapy: Secondary | ICD-10-CM | POA: Diagnosis not present

## 2023-11-15 DIAGNOSIS — Z17411 Hormone receptor positive with human epidermal growth factor receptor 2 negative status: Secondary | ICD-10-CM | POA: Insufficient documentation

## 2023-11-15 DIAGNOSIS — C50911 Malignant neoplasm of unspecified site of right female breast: Secondary | ICD-10-CM

## 2023-11-15 MED ORDER — PEGFILGRASTIM-FPGK 6 MG/0.6ML ~~LOC~~ SOSY
6.0000 mg | PREFILLED_SYRINGE | Freq: Once | SUBCUTANEOUS | Status: AC
Start: 2023-11-15 — End: 2023-11-15
  Administered 2023-11-15: 6 mg via SUBCUTANEOUS
  Filled 2023-11-15: qty 0.6

## 2023-11-15 NOTE — Patient Instructions (Signed)

## 2023-11-19 ENCOUNTER — Encounter: Payer: Self-pay | Admitting: *Deleted

## 2023-11-19 ENCOUNTER — Encounter: Payer: Self-pay | Admitting: Hematology & Oncology

## 2023-11-19 NOTE — Progress Notes (Signed)
 Patient received cycle two.  Oncology Nurse Navigator Documentation     11/19/2023    9:15 AM  Oncology Nurse Navigator Flowsheets  Navigator Follow Up Date: 12/04/2023  Navigator Follow Up Reason: Follow-up After Biopsy;Chemotherapy  Navigator Location [facility]  Navigator Encounter Type Appt/Treatment Plan Review  Patient Visit Type MedOnc  Treatment Phase Active Tx  Barriers/Navigation Needs Coordination of Care;Education  Interventions None Required  Acuity Level 2-Minimal Needs (1-2 Barriers Identified)  Support Groups/Services Friends and Family  Time Spent with Patient 15

## 2023-12-04 ENCOUNTER — Encounter: Payer: Self-pay | Admitting: Family

## 2023-12-04 ENCOUNTER — Inpatient Hospital Stay

## 2023-12-04 ENCOUNTER — Inpatient Hospital Stay (HOSPITAL_BASED_OUTPATIENT_CLINIC_OR_DEPARTMENT_OTHER): Admitting: Hematology & Oncology

## 2023-12-04 ENCOUNTER — Encounter: Payer: Self-pay | Admitting: Hematology & Oncology

## 2023-12-04 ENCOUNTER — Encounter: Payer: Self-pay | Admitting: *Deleted

## 2023-12-04 VITALS — BP 132/79 | HR 77

## 2023-12-04 VITALS — BP 140/69 | HR 79 | Temp 98.4°F | Resp 19 | Ht 67.0 in | Wt 173.0 lb

## 2023-12-04 DIAGNOSIS — C50911 Malignant neoplasm of unspecified site of right female breast: Secondary | ICD-10-CM

## 2023-12-04 DIAGNOSIS — D649 Anemia, unspecified: Secondary | ICD-10-CM

## 2023-12-04 DIAGNOSIS — Z5111 Encounter for antineoplastic chemotherapy: Secondary | ICD-10-CM | POA: Diagnosis not present

## 2023-12-04 DIAGNOSIS — C773 Secondary and unspecified malignant neoplasm of axilla and upper limb lymph nodes: Secondary | ICD-10-CM

## 2023-12-04 LAB — CBC WITH DIFFERENTIAL (CANCER CENTER ONLY)
Abs Immature Granulocytes: 0.07 K/uL (ref 0.00–0.07)
Basophils Absolute: 0 K/uL (ref 0.0–0.1)
Basophils Relative: 0 %
Eosinophils Absolute: 0 K/uL (ref 0.0–0.5)
Eosinophils Relative: 0 %
HCT: 32.7 % — ABNORMAL LOW (ref 36.0–46.0)
Hemoglobin: 11 g/dL — ABNORMAL LOW (ref 12.0–15.0)
Immature Granulocytes: 1 %
Lymphocytes Relative: 9 %
Lymphs Abs: 0.9 K/uL (ref 0.7–4.0)
MCH: 32.4 pg (ref 26.0–34.0)
MCHC: 33.6 g/dL (ref 30.0–36.0)
MCV: 96.5 fL (ref 80.0–100.0)
Monocytes Absolute: 0.5 K/uL (ref 0.1–1.0)
Monocytes Relative: 5 %
Neutro Abs: 8.4 K/uL — ABNORMAL HIGH (ref 1.7–7.7)
Neutrophils Relative %: 85 %
Platelet Count: 222 K/uL (ref 150–400)
RBC: 3.39 MIL/uL — ABNORMAL LOW (ref 3.87–5.11)
RDW: 14.6 % (ref 11.5–15.5)
WBC Count: 9.9 K/uL (ref 4.0–10.5)
nRBC: 0 % (ref 0.0–0.2)

## 2023-12-04 LAB — CMP (CANCER CENTER ONLY)
ALT: 14 U/L (ref 0–44)
AST: 22 U/L (ref 15–41)
Albumin: 4 g/dL (ref 3.5–5.0)
Alkaline Phosphatase: 50 U/L (ref 38–126)
Anion gap: 13 (ref 5–15)
BUN: 14 mg/dL (ref 8–23)
CO2: 22 mmol/L (ref 22–32)
Calcium: 9.3 mg/dL (ref 8.9–10.3)
Chloride: 108 mmol/L (ref 98–111)
Creatinine: 0.87 mg/dL (ref 0.44–1.00)
GFR, Estimated: >60 mL/min (ref >60)
Glucose, Bld: 126 mg/dL — ABNORMAL HIGH (ref 70–99)
Potassium: 4 mmol/L (ref 3.5–5.1)
Sodium: 143 mmol/L (ref 135–145)
Total Bilirubin: 0.5 mg/dL (ref 0.0–1.2)
Total Protein: 6.1 g/dL — ABNORMAL LOW (ref 6.5–8.1)

## 2023-12-04 LAB — LACTATE DEHYDROGENASE: LDH: 237 U/L — ABNORMAL HIGH (ref 98–192)

## 2023-12-04 MED ORDER — DEXAMETHASONE SODIUM PHOSPHATE 10 MG/ML IJ SOLN
10.0000 mg | Freq: Once | INTRAMUSCULAR | Status: AC
  Administered 2023-12-04: 10 mg via INTRAVENOUS
  Filled 2023-12-04: qty 1

## 2023-12-04 MED ORDER — MUPIROCIN 2 % EX OINT: 1.0000 | TOPICAL_OINTMENT | Freq: Three times a day (TID) | CUTANEOUS | 3 refills | Status: DC

## 2023-12-04 MED ORDER — SODIUM CHLORIDE 0.9 % IV SOLN
67.5000 mg/m2 | Freq: Once | INTRAVENOUS | Status: AC
  Administered 2023-12-04: 131 mg via INTRAVENOUS
  Filled 2023-12-04: qty 13.1

## 2023-12-04 MED ORDER — SODIUM CHLORIDE 0.9 % IV SOLN
600.0000 mg/m2 | Freq: Once | INTRAVENOUS | Status: AC
  Administered 2023-12-04: 1160 mg via INTRAVENOUS
  Filled 2023-12-04: qty 50

## 2023-12-04 MED ORDER — SODIUM CHLORIDE 0.9 % IV SOLN: INTRAVENOUS | Status: DC

## 2023-12-04 MED ORDER — PALONOSETRON HCL INJECTION 0.25 MG/5ML
0.2500 mg | Freq: Once | INTRAVENOUS | Status: AC
  Administered 2023-12-04: 0.25 mg via INTRAVENOUS
  Filled 2023-12-04: qty 5

## 2023-12-04 NOTE — Progress Notes (Signed)
 Hematology and Oncology Follow Up Visit  Brittini Brubeck [id number] 12-12-1950 73 y.o. 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 Boyden is here today for follow-up and treatment.  She has been doing pretty well.  She really had a hard time with the ciprofloxacin .  There is a little bit of erythema about the Port-A-Cath.  I went ahead and called in a prescription for Bactroban  ointment for her to put on 3 times a day.  Her appetite is okay.  She has had no nausea or vomiting.  She has had no diarrhea.  There has been no bleeding.  She has had no leg swelling.  She has had no cough or shortness of breath.  She has had no mouth sores.  There is been no headache.  Overall, I would say that her performance status is probably ECOG 1.    Medications:  Allergies as of 12/04/2023       Reactions   Sulfa Antibiotics Rash        Medication List        Accurate as of December 04, 2023 11:19 AM. If you have any questions, ask your nurse or doctor.          Calcium Carbonate-Vitamin D 600-200 MG-UNIT Tabs Take by mouth daily.   cholecalciferol 25 MCG (1000 UNIT) tablet Commonly known as: VITAMIN D3 Take 1,000 Units by mouth daily.   ciprofloxacin  500 MG tablet Commonly known as: Cipro  Take 1 tablet (500 mg total) by mouth daily with breakfast. Please start the pills the day after each chemotherapy session.   citalopram  10 MG tablet Commonly known as: CELEXA  Take 1 tablet (10 mg total) by mouth daily.   COLLAGEN PO Take by mouth daily.   cyanocobalamin 1000 MCG tablet Commonly known as: VITAMIN B12 Take by mouth daily.   dexamethasone  4 MG tablet Commonly known as: DECADRON  Take 2  tabs by mouth 2 times daily starting day before chemo. Then take 2 tabs daily for 2 days starting day after chemo. Take with food.   ELDERBERRY PO Take 1 tablet by mouth daily.   losartan  50 MG tablet Commonly known as: COZAAR  TAKE 1 TABLET BY MOUTH EVERY DAY   multivitamin with minerals tablet Take by mouth.   ondansetron  8 MG tablet Commonly known as: ZOFRAN  Take 1 tablet (8 mg total) by mouth every 8 (eight) hours as needed for nausea or vomiting.   prochlorperazine  10 MG tablet Commonly known as: COMPAZINE  Take 1 tablet (10 mg total) by mouth every 6 (six) hours as needed for nausea or vomiting.   TURMERIC PO Take by mouth daily.        Allergies:  Allergies  Allergen Reactions   Sulfa Antibiotics Rash    Past Medical History, Surgical history, Social history, and Family History were reviewed and updated.  Review of Systems:  rReview of Systems  Constitutional: Negative.   HENT: Negative.    Eyes: Negative.   Respiratory: Negative.    Cardiovascular: Negative.   Gastrointestinal: Negative.   Genitourinary: Negative.   Musculoskeletal: Negative.   Skin: Negative.   Neurological: Negative.   Endo/Heme/Allergies: Negative.   Psychiatric/Behavioral: Negative.       Physical Exam:  height is 5'  7 (1.702 m) and weight is 173 lb (78.5 kg). Her oral temperature is 98.4 F (36.9 C). Her blood pressure is 140/69 (abnormal) and her pulse is 79. Her respiration is 19 and oxygen saturation is 97%.   Wt Readings from Last 3 Encounters:  12/04/23 173 lb (78.5 kg)  11/13/23 175 lb (79.4 kg)  10/23/23 183 lb 1.9 oz (83.1 kg)    Physical Exam Vitals reviewed.  HENT:     Head: Normocephalic and atraumatic.  Eyes:     Pupils: Pupils are equal, round, and reactive to light.  Cardiovascular:     Rate and Rhythm: Normal rate and regular rhythm.     Heart sounds: Normal heart sounds.  Pulmonary:     Effort: Pulmonary effort is normal.     Breath sounds: Normal  breath sounds.  Abdominal:     General: Bowel sounds are normal.     Palpations: Abdomen is soft.  Musculoskeletal:        General: No tenderness or deformity. Normal range of motion.     Cervical back: Normal range of motion.  Lymphadenopathy:     Cervical: No cervical adenopathy.  Skin:    General: Skin is warm and dry.     Findings: No erythema or rash.  Neurological:     Mental Status: She is alert and oriented to person, place, and time.  Psychiatric:        Behavior: Behavior normal.        Thought Content: Thought content normal.        Judgment: Judgment normal.      Lab Results  Component Value Date   WBC 9.9 12/04/2023   HGB 11.0 (L) 12/04/2023   HCT 32.7 (L) 12/04/2023   MCV 96.5 12/04/2023   PLT 222 12/04/2023   Lab Results  Component Value Date   FERRITIN 573 (H) 11/13/2023   IRON 122 11/13/2023   TIBC 337 11/13/2023   UIBC 215 11/13/2023   IRONPCTSAT 36 (H) 11/13/2023   Lab Results  Component Value Date   RBC 3.39 (L) 12/04/2023   No results found for: KPAFRELGTCHN, LAMBDASER, KAPLAMBRATIO No results found for: IGGSERUM, IGA, IGMSERUM No results found for: STEPHANY CARLOTA BENSON MARKEL EARLA JOANNIE DOC VICK, SPEI   Chemistry      Component Value Date/Time   NA 141 11/13/2023 0951   NA 142 02/02/2015 0000   K 3.7 11/13/2023 0951   CL 106 11/13/2023 0951   CO2 22 11/13/2023 0951   BUN 15 11/13/2023 0951   BUN 13 02/02/2015 0000   CREATININE 0.85 11/13/2023 0951   GLU 101 02/02/2015 0000      Component Value Date/Time   CALCIUM 9.7 11/13/2023 0951   ALKPHOS 48 11/13/2023 0951   AST 19 11/13/2023 0951   ALT 14 11/13/2023 0951   BILITOT 0.4 11/13/2023 0951       Impression and Plan: Ms. Wilmouth is a very pleasant 73 yo caucasian postmenopausal female with locally advanced infiltrating ductal carcinoma of the right breast- ER+/PR+/HER2- --- Oncotype =30.   We will go ahead with her third cycle of  chemotherapy.  We will plan for a total of 4 cycles of chemotherapy in the neoadjuvant setting.  She is showing a lot of toughness.  I have to give her a lot of credit.  She really is motivated.  We will plan to see her back in 3 weeks for her fourth and final cycle of treatment.  Plan is for a  total of 4 cycles of neoadjuvant chemotherapy with   Maude JONELLE Crease, MD 8/20/202511:19 AM

## 2023-12-04 NOTE — Patient Instructions (Signed)

## 2023-12-04 NOTE — Progress Notes (Signed)
 Patient will proceed with cycle three of planned four cycles. She has good tolerance of regimen.   Oncology Nurse Navigator Documentation     12/04/2023   12:45 PM  Oncology Nurse Navigator Flowsheets  Navigator Follow Up Date: 12/25/2023  Navigator Follow Up Reason: Follow-up Appointment;Chemotherapy  Navigator Location [facility]  Navigator Encounter Type Follow-up Appt  Patient Visit Type MedOnc  Treatment Phase Active Tx  Barriers/Navigation Needs No Barriers At This Time  Interventions None Required  Acuity Level 1-No Barriers  Support Groups/Services Friends and Family  Time Spent with Patient 15

## 2023-12-06 ENCOUNTER — Inpatient Hospital Stay

## 2023-12-06 VITALS — BP 143/79 | HR 76 | Temp 98.8°F | Resp 18

## 2023-12-06 DIAGNOSIS — C50911 Malignant neoplasm of unspecified site of right female breast: Secondary | ICD-10-CM

## 2023-12-06 DIAGNOSIS — Z5111 Encounter for antineoplastic chemotherapy: Secondary | ICD-10-CM | POA: Diagnosis not present

## 2023-12-06 MED ORDER — PEGFILGRASTIM-FPGK 6 MG/0.6ML ~~LOC~~ SOSY
6.0000 mg | PREFILLED_SYRINGE | Freq: Once | SUBCUTANEOUS | Status: AC
  Administered 2023-12-06: 6 mg via SUBCUTANEOUS
  Filled 2023-12-06: qty 0.6

## 2023-12-06 NOTE — Patient Instructions (Signed)

## 2023-12-25 ENCOUNTER — Inpatient Hospital Stay

## 2023-12-25 ENCOUNTER — Encounter: Payer: Self-pay | Admitting: Hematology & Oncology

## 2023-12-25 ENCOUNTER — Inpatient Hospital Stay (HOSPITAL_BASED_OUTPATIENT_CLINIC_OR_DEPARTMENT_OTHER): Admitting: Hematology & Oncology

## 2023-12-25 ENCOUNTER — Inpatient Hospital Stay: Attending: Hematology & Oncology

## 2023-12-25 ENCOUNTER — Encounter: Payer: Self-pay | Admitting: *Deleted

## 2023-12-25 VITALS — BP 128/77 | HR 80 | Temp 98.0°F | Resp 19

## 2023-12-25 VITALS — BP 120/71 | HR 81 | Temp 99.0°F | Resp 20 | Ht 67.0 in | Wt 171.0 lb

## 2023-12-25 DIAGNOSIS — Z79630 Long term (current) use of alkylating agent: Secondary | ICD-10-CM | POA: Insufficient documentation

## 2023-12-25 DIAGNOSIS — C773 Secondary and unspecified malignant neoplasm of axilla and upper limb lymph nodes: Secondary | ICD-10-CM | POA: Diagnosis not present

## 2023-12-25 DIAGNOSIS — Z17411 Hormone receptor positive with human epidermal growth factor receptor 2 negative status: Secondary | ICD-10-CM | POA: Insufficient documentation

## 2023-12-25 DIAGNOSIS — Z79633 Long term (current) use of mitotic inhibitor: Secondary | ICD-10-CM | POA: Insufficient documentation

## 2023-12-25 DIAGNOSIS — C50911 Malignant neoplasm of unspecified site of right female breast: Secondary | ICD-10-CM

## 2023-12-25 DIAGNOSIS — C50811 Malignant neoplasm of overlapping sites of right female breast: Secondary | ICD-10-CM | POA: Diagnosis not present

## 2023-12-25 DIAGNOSIS — Z5189 Encounter for other specified aftercare: Secondary | ICD-10-CM | POA: Insufficient documentation

## 2023-12-25 DIAGNOSIS — Z5111 Encounter for antineoplastic chemotherapy: Secondary | ICD-10-CM | POA: Insufficient documentation

## 2023-12-25 LAB — CBC WITH DIFFERENTIAL (CANCER CENTER ONLY)
Abs Immature Granulocytes: 0.08 K/uL — ABNORMAL HIGH (ref 0.00–0.07)
Basophils Absolute: 0 K/uL (ref 0.0–0.1)
Basophils Relative: 0 %
Eosinophils Absolute: 0 K/uL (ref 0.0–0.5)
Eosinophils Relative: 0 %
HCT: 30.9 % — ABNORMAL LOW (ref 36.0–46.0)
Hemoglobin: 10.5 g/dL — ABNORMAL LOW (ref 12.0–15.0)
Immature Granulocytes: 1 %
Lymphocytes Relative: 9 %
Lymphs Abs: 0.9 K/uL (ref 0.7–4.0)
MCH: 33 pg (ref 26.0–34.0)
MCHC: 34 g/dL (ref 30.0–36.0)
MCV: 97.2 fL (ref 80.0–100.0)
Monocytes Absolute: 0.6 K/uL (ref 0.1–1.0)
Monocytes Relative: 6 %
Neutro Abs: 8.3 K/uL — ABNORMAL HIGH (ref 1.7–7.7)
Neutrophils Relative %: 84 %
Platelet Count: 192 K/uL (ref 150–400)
RBC: 3.18 MIL/uL — ABNORMAL LOW (ref 3.87–5.11)
RDW: 15 % (ref 11.5–15.5)
WBC Count: 10 K/uL (ref 4.0–10.5)
nRBC: 0 % (ref 0.0–0.2)

## 2023-12-25 LAB — CMP (CANCER CENTER ONLY)
ALT: 14 U/L (ref 0–44)
AST: 24 U/L (ref 15–41)
Albumin: 4 g/dL (ref 3.5–5.0)
Alkaline Phosphatase: 52 U/L (ref 38–126)
Anion gap: 13 (ref 5–15)
BUN: 13 mg/dL (ref 8–23)
CO2: 23 mmol/L (ref 22–32)
Calcium: 9.5 mg/dL (ref 8.9–10.3)
Chloride: 105 mmol/L (ref 98–111)
Creatinine: 0.9 mg/dL (ref 0.44–1.00)
GFR, Estimated: >60 mL/min (ref >60)
Glucose, Bld: 147 mg/dL — ABNORMAL HIGH (ref 70–99)
Potassium: 3.7 mmol/L (ref 3.5–5.1)
Sodium: 141 mmol/L (ref 135–145)
Total Bilirubin: 0.5 mg/dL (ref 0.0–1.2)
Total Protein: 6.3 g/dL — ABNORMAL LOW (ref 6.5–8.1)

## 2023-12-25 MED ORDER — SODIUM CHLORIDE 0.9 % IV SOLN
600.0000 mg/m2 | Freq: Once | INTRAVENOUS | Status: AC
  Administered 2023-12-25: 1160 mg via INTRAVENOUS
  Filled 2023-12-25: qty 50

## 2023-12-25 MED ORDER — SODIUM CHLORIDE 0.9 % IV SOLN
67.5000 mg/m2 | Freq: Once | INTRAVENOUS | Status: AC
  Administered 2023-12-25: 131 mg via INTRAVENOUS
  Filled 2023-12-25: qty 13.1

## 2023-12-25 MED ORDER — SODIUM CHLORIDE 0.9 % IV SOLN: INTRAVENOUS | Status: DC

## 2023-12-25 MED ORDER — DEXAMETHASONE SODIUM PHOSPHATE 10 MG/ML IJ SOLN
10.0000 mg | Freq: Once | INTRAMUSCULAR | Status: AC
  Administered 2023-12-25: 10 mg via INTRAVENOUS
  Filled 2023-12-25: qty 1

## 2023-12-25 MED ORDER — PALONOSETRON HCL INJECTION 0.25 MG/5ML
0.2500 mg | Freq: Once | INTRAVENOUS | Status: AC
  Administered 2023-12-25: 0.25 mg via INTRAVENOUS
  Filled 2023-12-25: qty 5

## 2023-12-25 NOTE — Progress Notes (Unsigned)
 Patient will proceed with final cycle of chemo today. She will need a post treatment MRI and then plan for surgery. MRI scheduled for  Patient is excited to be done. She is encouraged to call us  with any questions or concerns.

## 2023-12-25 NOTE — Patient Instructions (Signed)

## 2023-12-25 NOTE — Patient Instructions (Signed)
 [facility] - A DEPT OF [facility]  Discharge Instructions: Thank you for choosing [facility] to provide your oncology and hematology care.   If you have a lab appointment with the Cancer Center, please go directly to the Cancer Center and check in at the registration area.  Wear comfortable clothing and clothing appropriate for easy access to any Portacath or PICC line.   We strive to give you quality time with your provider. You may need to reschedule your appointment if you arrive late (15 or more minutes).  Arriving late affects you and other patients whose appointments are after yours.  Also, if you miss three or more appointments without notifying the office, you may be dismissed from the clinic at the provider's discretion.      For prescription refill requests, have your pharmacy contact our office and allow 72 hours for refills to be completed.    Today you received the following chemotherapy and/or immunotherapy agents Taxotere  and Cytoxan       To help prevent nausea and vomiting after your treatment, we encourage you to take your nausea medication as directed.  BELOW ARE SYMPTOMS THAT SHOULD BE REPORTED IMMEDIATELY: *FEVER GREATER THAN 100.4 F (38 C) OR HIGHER *CHILLS OR SWEATING *NAUSEA AND VOMITING THAT IS NOT CONTROLLED WITH YOUR NAUSEA MEDICATION *UNUSUAL SHORTNESS OF BREATH *UNUSUAL BRUISING OR BLEEDING *URINARY PROBLEMS (pain or burning when urinating, or frequent urination) *BOWEL PROBLEMS (unusual diarrhea, constipation, pain near the anus) TENDERNESS IN MOUTH AND THROAT WITH OR WITHOUT PRESENCE OF ULCERS (sore throat, sores in mouth, or a toothache) UNUSUAL RASH, SWELLING OR PAIN  UNUSUAL VAGINAL DISCHARGE OR ITCHING   Items with * indicate a potential emergency and should be followed up as soon as possible or go to the Emergency Department if any problems should occur.  Please show the CHEMOTHERAPY ALERT CARD or  IMMUNOTHERAPY ALERT CARD at check-in to the Emergency Department and triage nurse. Should you have questions after your visit or need to cancel or reschedule your appointment, please contact [facility] - A DEPT OF [facility]  [phone] and follow the prompts.  Office hours are 8:00 a.m. to 4:30 p.m. Monday - Friday. Please note that voicemails left after 4:00 p.m. may not be returned until the following business day.  We are closed weekends and major holidays. You have access to a nurse at all times for urgent questions. Please call the main number to the clinic [phone] and follow the prompts.  For any non-urgent questions, you may also contact your provider using MyChart. We now offer e-Visits for anyone 2 and older to request care online for non-urgent symptoms. For details visit [email or web address].   Also download the MyChart app! Go to the app store, search MyChart, open the app, select [place], and log in with your MyChart username and password.

## 2023-12-25 NOTE — Progress Notes (Signed)
 Hematology and Oncology Follow Up Visit  [name] [id number] November 29, 1950 73 y.o. 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  She has been doing pretty well.  Thankfully, she has had no complications with the chemotherapy.  She has had no infections.  There is been no bleeding.  She has had no nausea or vomiting.  She has felt that the mass in the right breast is shrunken.  I told her that after this cycle, we will then plan for a follow-up MRI.  I probably would do this in about a month.  After that, we can then get her over to Dr. Ebbie.  Her Port-A-Cath does look better.  She has had no change in bowel or bladder habits.  She has had no diarrhea.  There is been no leg swelling..  She does have little bit of tingling in the fingertips.    Overall, I would say that her performance status is probably ECOG 1.    Medications:  Allergies as of 12/25/2023       Reactions   Sulfa Antibiotics Rash        Medication List        Accurate as of December 25, 2023 11:11 AM. If you have any questions, ask your nurse or doctor.          STOP taking these medications    ciprofloxacin  500 MG tablet Commonly known as: Cipro  Stopped by: Maude JONELLE Crease       TAKE these medications    Calcium Carbonate-Vitamin D 600-200 MG-UNIT Tabs Take by mouth daily.   cholecalciferol 25 MCG (1000 UNIT) tablet Commonly known as: VITAMIN D3 Take 1,000 Units by mouth daily.   citalopram  10 MG tablet Commonly known as: CELEXA  Take 1 tablet (10 mg total) by mouth daily.   COLLAGEN PO Take by mouth daily.   cyanocobalamin 1000 MCG  tablet Commonly known as: VITAMIN B12 Take by mouth daily.   dexamethasone  4 MG tablet Commonly known as: DECADRON  Take 2 tabs by mouth 2 times daily starting day before chemo. Then take 2 tabs daily for 2 days starting day after chemo. Take with food.   ELDERBERRY PO Take 1 tablet by mouth daily.   losartan  50 MG tablet Commonly known as: COZAAR  TAKE 1 TABLET BY MOUTH EVERY DAY   multivitamin with minerals tablet Take by mouth.   mupirocin  ointment 2 % Commonly known as: BACTROBAN  Apply 1 Application topically 3 (three) times daily.   ondansetron  8 MG tablet Commonly known as: ZOFRAN  Take 1 tablet (8 mg total) by mouth every 8 (eight) hours as needed for nausea or vomiting.   prochlorperazine  10 MG tablet Commonly known as: COMPAZINE  Take 1 tablet (10 mg total) by mouth every 6 (six) hours as needed for nausea or vomiting.   TURMERIC PO Take by mouth daily.        Allergies:  Allergies  Allergen Reactions   Sulfa Antibiotics Rash    Past Medical History, Surgical history, Social history, and Family History were reviewed and updated.  Review of Systems:  rReview of Systems  Constitutional: Negative.   HENT: Negative.    Eyes: Negative.   Respiratory:  Negative.    Cardiovascular: Negative.   Gastrointestinal: Negative.   Genitourinary: Negative.   Musculoskeletal: Negative.   Skin: Negative.   Neurological: Negative.   Endo/Heme/Allergies: Negative.   Psychiatric/Behavioral: Negative.       Physical Exam:  height is 5' 7 (1.702 m) and weight is 171 lb (77.6 kg). Her oral temperature is 99 F (37.2 C). Her blood pressure is 120/71 and her pulse is 81. Her respiration is 20 and oxygen saturation is 98%.   Wt Readings from Last 3 Encounters:  12/25/23 171 lb (77.6 kg)  12/04/23 173 lb (78.5 kg)  11/13/23 175 lb (79.4 kg)    Physical Exam Vitals reviewed.  Constitutional:      Comments: Her breast exam shows left breast with no masses, edema or  erythema.  There is no left axillary adenopathy.  Right breast shows a small mass at about the 2 o'clock position.  This measures less than a centimeter.  It is mobile.  There is no right axillary adenopathy.  HENT:     Head: Normocephalic and atraumatic.  Eyes:     Pupils: Pupils are equal, round, and reactive to light.  Cardiovascular:     Rate and Rhythm: Normal rate and regular rhythm.     Heart sounds: Normal heart sounds.  Pulmonary:     Effort: Pulmonary effort is normal.     Breath sounds: Normal breath sounds.  Abdominal:     General: Bowel sounds are normal.     Palpations: Abdomen is soft.  Musculoskeletal:        General: No tenderness or deformity. Normal range of motion.     Cervical back: Normal range of motion.  Lymphadenopathy:     Cervical: No cervical adenopathy.  Skin:    General: Skin is warm and dry.     Findings: No erythema or rash.  Neurological:     Mental Status: She is alert and oriented to person, place, and time.  Psychiatric:        Behavior: Behavior normal.        Thought Content: Thought content normal.        Judgment: Judgment normal.      Lab Results  Component Value Date   WBC 10.0 12/25/2023   HGB 10.5 (L) 12/25/2023   HCT 30.9 (L) 12/25/2023   MCV 97.2 12/25/2023   PLT 192 12/25/2023   Lab Results  Component Value Date   FERRITIN 573 (H) 11/13/2023   IRON 122 11/13/2023   TIBC 337 11/13/2023   UIBC 215 11/13/2023   IRONPCTSAT 36 (H) 11/13/2023   Lab Results  Component Value Date   RBC 3.18 (L) 12/25/2023   No results found for: KPAFRELGTCHN, LAMBDASER, KAPLAMBRATIO No results found for: IGGSERUM, IGA, IGMSERUM No results found for: STEPHANY CARLOTA BENSON MARKEL EARLA JOANNIE DOC VICK, SPEI   Chemistry      Component Value Date/Time   NA 141 12/25/2023 1028   NA 142 02/02/2015 0000   K 3.7 12/25/2023 1028   CL 105 12/25/2023 1028   CO2 23 12/25/2023 1028   BUN 13  12/25/2023 1028   BUN 13 02/02/2015 0000   CREATININE 0.90 12/25/2023 1028   GLU 101 02/02/2015 0000      Component Value Date/Time   CALCIUM 9.5 12/25/2023 1028   ALKPHOS 52 12/25/2023 1028   AST 24 12/25/2023 1028   ALT 14 12/25/2023 1028   BILITOT 0.5 12/25/2023 1028       Impression and  Plan: [name] is a very pleasant 73 yo caucasian postmenopausal female with locally advanced infiltrating ductal carcinoma of the right breast- ER+/PR+/HER2- --- Oncotype =30.   This will be her last cycle of chemotherapy.  Again, after this we will set up the breast MRI.  We will then see about getting her over to Dr. Ebbie and then he and Ms. Safley we decide how best to remove the tumor.  Maude JONELLE Crease, MD 9/10/202511:11 AM

## 2023-12-26 ENCOUNTER — Ambulatory Visit: Payer: Self-pay

## 2023-12-26 ENCOUNTER — Encounter: Payer: Self-pay | Admitting: Hematology & Oncology

## 2023-12-26 ENCOUNTER — Encounter: Payer: Self-pay | Admitting: *Deleted

## 2023-12-26 NOTE — Telephone Encounter (Signed)
 FYI Only or Action Required?: FYI only for provider.  Patient was last seen in primary care on 07/02/2023 by Luke Chiquita SAUNDERS, DO.  Called Nurse Triage reporting Hip Pain.  Symptoms began several weeks ago.  Interventions attempted: OTC medications: Tylenol .  Symptoms are: gradually worsening.  Triage Disposition: See PCP Within 2 Weeks  Patient/caregiver understands and will follow disposition?: Yes  ** Appt scheduled for 9/22 per patient request **           Copied from CRM #[id number]. Topic: Clinical - Red Word Triage >> Dec 26, 2023 12:09 PM Pinkey ORN wrote: Red Word that prompted transfer to Nurse Triage: Pain >> Dec 26, 2023 12:10 PM Pinkey ORN wrote: Patient states she's experiencing some pain in her left hip, states it's pain on movement and currently it's getting worse.  Reason for Disposition  [1] MILD pain (e.g., does not interfere with normal activities) AND [2] present > 7 days  Answer Assessment - Initial Assessment Questions 1. LOCATION and RADIATION: Where is the pain located? Does the pain spread (shoot) anywhere else?     Left hip  2. QUALITY: What does the pain feel like?  (e.g., sharp, dull, aching, burning)     Aching, with movement   3. SEVERITY: How bad is the pain? What does it keep you from doing?   (Scale 1-10; or mild, moderate, severe)     Sitting 1/10, standing 5/10  4. ONSET: When did the pain start? Does it come and go, or is it there all the time?     Several months, getting worse over the last 3 weeks, intermittent   5. WORK OR EXERCISE: Has there been any recent work or exercise that involved this part of the body?      No   6. CAUSE: What do you think is causing the hip pain?      Chronic problem   7. AGGRAVATING FACTORS: What makes the hip pain worse? (e.g., walking, climbing stairs, running)      Walking makes the pain worse   8. OTHER SYMPTOMS: Do you have any other symptoms? (e.g., back pain, pain  shooting down leg,  fever, rash)     No   Taking Tylenol  Arthritis for the pain. Appt scheduled for 9/22 per patient request as she will be finishing up her last Chemo treatment and will have the energy to attend the appt. That week.  Protocols used: Hip Pain-A-AH

## 2023-12-26 NOTE — Progress Notes (Signed)
 Patient is calling with a question regarding her MRI order. Previously, her breast MRI was unable to visualize her axillary LNs. These were picked up by PET. She would like to know if an MRI is the better choice to determine treatment response, or if she should have another PET.   Message sent to Dr Timmy with his response as follows:  Because the MRI will actually shows how much the cancer has shrunk.  This is what the surgeon wants not a PET scan  Called patient and notified of response.   Oncology Nurse Navigator Documentation     12/26/2023    9:45 AM  Oncology Nurse Navigator Flowsheets  Navigator Follow Up Date: 01/23/2024  Navigator Follow Up Reason: Scan Review  Navigator Location [facility]  Navigator Encounter Type Telephone  Telephone Incoming Call  Patient Visit Type MedOnc  Treatment Phase Post-Tx Follow-up  Barriers/Navigation Needs Education  Education Other  Interventions Education  Acuity Level 1-No Barriers  Education Method Verbal  Support Groups/Services Friends and Family  Time Spent with Patient 15

## 2023-12-26 NOTE — Telephone Encounter (Signed)
 Noted. Has appt 01/06/24. BJ

## 2023-12-27 ENCOUNTER — Inpatient Hospital Stay

## 2023-12-27 VITALS — BP 138/70 | HR 90 | Temp 98.7°F | Resp 18

## 2023-12-27 DIAGNOSIS — Z5111 Encounter for antineoplastic chemotherapy: Secondary | ICD-10-CM | POA: Diagnosis not present

## 2023-12-27 DIAGNOSIS — C50911 Malignant neoplasm of unspecified site of right female breast: Secondary | ICD-10-CM

## 2023-12-27 MED ORDER — PEGFILGRASTIM-FPGK 6 MG/0.6ML ~~LOC~~ SOSY
6.0000 mg | PREFILLED_SYRINGE | Freq: Once | SUBCUTANEOUS | Status: AC
  Administered 2023-12-27: 6 mg via SUBCUTANEOUS
  Filled 2023-12-27: qty 0.6

## 2023-12-27 NOTE — Patient Instructions (Signed)

## 2023-12-28 ENCOUNTER — Other Ambulatory Visit: Payer: Self-pay | Admitting: Family Medicine

## 2023-12-28 DIAGNOSIS — I10 Essential (primary) hypertension: Secondary | ICD-10-CM

## 2024-01-06 ENCOUNTER — Ambulatory Visit (INDEPENDENT_AMBULATORY_CARE_PROVIDER_SITE_OTHER)
Admission: RE | Admit: 2024-01-06 | Discharge: 2024-01-06 | Disposition: A | Discharge status: Home / Self Care | Source: Ambulatory Visit | Attending: Family Medicine | Admitting: Family Medicine

## 2024-01-06 ENCOUNTER — Encounter: Payer: Self-pay | Admitting: Family Medicine

## 2024-01-06 ENCOUNTER — Ambulatory Visit (INDEPENDENT_AMBULATORY_CARE_PROVIDER_SITE_OTHER): Admitting: Family Medicine

## 2024-01-06 VITALS — BP 116/70 | HR 89 | Temp 98.3°F | Resp 16 | Ht 67.0 in | Wt 167.2 lb

## 2024-01-06 DIAGNOSIS — M25552 Pain in left hip: Secondary | ICD-10-CM | POA: Diagnosis not present

## 2024-01-06 DIAGNOSIS — I10 Essential (primary) hypertension: Secondary | ICD-10-CM | POA: Diagnosis not present

## 2024-01-06 DIAGNOSIS — F3341 Major depressive disorder, recurrent, in partial remission: Secondary | ICD-10-CM

## 2024-01-06 DIAGNOSIS — G8929 Other chronic pain: Secondary | ICD-10-CM

## 2024-01-06 DIAGNOSIS — M1612 Unilateral primary osteoarthritis, left hip: Secondary | ICD-10-CM | POA: Diagnosis not present

## 2024-01-06 MED ORDER — MELOXICAM 7.5 MG PO TABS: 7.5000 mg | ORAL_TABLET | Freq: Every day | ORAL | 1 refills | Status: DC | PRN

## 2024-01-06 NOTE — Patient Instructions (Addendum)
 A few things to remember from today's visit:  Essential hypertension, benign  Hip pain, chronic, left - Plan: meloxicam  (MOBIC ) 7.5 MG tablet, DG Hip Unilat W OR W/O Pelvis 2-3 Views Left  Depression, major, recurrent, in partial remission (HCC)  If you need refills for medications you take chronically, please call your pharmacy. Do not use My Chart to request refills or for acute issues that need immediate attention. If you send a my chart message, it may take a few days to be addressed, specially if I am not in the office.  Please be sure medication list is accurate. If a new problem present, please set up appointment sooner than planned today.

## 2024-01-06 NOTE — Progress Notes (Unsigned)
 ACUTE VISIT Chief Complaint  Patient presents with   Hip Pain   Discussed the use of AI scribe software for clinical note transcription with the patient, who gave verbal consent to proceed.  History of Present Illness [name] is a 73 year old female with a history of breast cancer who presents with left hip pain.  She has been experiencing left hip pain for over a year, which has recently become more consistent and frequent. Initially, the pain was intermittent, occurring primarily when getting up from a chair, but now she feels it even while sitting, especially during work. The pain is located in the groin area and is described as sharp, with intensity ranging from 7 to 10 out of 10 during severe episodes, and 2 to 3 out of 10 when sitting.  She has not been taking regular medication for the pain due to recent chemotherapy, which she completed 11 days ago. Occasionally, she has taken Tylenol  for arthritis but not consistently. In April of the previous year, she experienced pain in both hips, with the right hip being worse than the left, particularly during rainy days. No x-rays were done then, and there was no history of trauma.  She denies any recent injury or taking medications that could aggravate the pain. There is no history of blood in the urine or urinary problems. Her current medications include losartan  50 mg for blood pressure and citalopram  10 mg for depression and anxiety, both of which she feels are effective. She has not been monitoring her blood pressure at home but reports it has been good during her treatments.  Lab Results  Component Value Date   NA 141 12/25/2023   CL 105 12/25/2023   K 3.7 12/25/2023   CO2 23 12/25/2023   BUN 13 12/25/2023   CREATININE 0.90 12/25/2023   GFRNONAA >60 12/25/2023   CALCIUM 9.5 12/25/2023   ALBUMIN 4.0 12/25/2023   GLUCOSE 147 (H) 12/25/2023   Lab Results  Component Value Date   WBC 10.0 12/25/2023   HGB 10.5 (L)  12/25/2023   HCT 30.9 (L) 12/25/2023   MCV 97.2 12/25/2023   PLT 192 12/25/2023   Lab Results  Component Value Date   TSH 1.71 12/06/2021    Review of Systems See other pertinent positives and negatives in HPI.  Current Outpatient Medications on File Prior to Visit  Medication Sig Dispense Refill   Calcium Carbonate-Vitamin D 600-200 MG-UNIT TABS Take by mouth daily.     cholecalciferol (VITAMIN D3) 25 MCG (1000 UNIT) tablet Take 1,000 Units by mouth daily.     citalopram  (CELEXA ) 10 MG tablet Take 1 tablet (10 mg total) by mouth daily. 90 tablet 0   Collagen-Vitamin C-Biotin (COLLAGEN PO) Take by mouth daily.     ELDERBERRY PO Take 1 tablet by mouth daily.     losartan  (COZAAR ) 50 MG tablet TAKE 1 TABLET BY MOUTH EVERY DAY 30 tablet 0   Multiple Vitamins-Minerals (MULTIVITAMIN WITH MINERALS) tablet Take by mouth.     TURMERIC PO Take by mouth daily.     vitamin B-12 (CYANOCOBALAMIN) 1000 MCG tablet Take by mouth daily.     No current facility-administered medications on file prior to visit.    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   Complication of anesthesia    Depression    History of  DVT of lower extremity 2007,2009   left   Hypertension    PONV (postoperative nausea and vomiting)    Skin cancer    skin cancers removed   Allergies  Allergen Reactions   Sulfa Antibiotics Rash    Social History   Socioeconomic History   Marital status: Divorced    Spouse name: Not on file   Number of children: Not on file   Years of education: Not on file   Highest education level: Some college, no degree  Occupational History   Not on file  Tobacco Use   Smoking status: Never   Smokeless tobacco: Never  Vaping Use   Vaping status: Never Used  Substance and Sexual Activity   Alcohol use: Yes    Comment: rarely   Drug use: No   Sexual activity: Not  Currently    Birth control/protection: Surgical    Comment: hyst  Other Topics Concern   Not on file  Social History Narrative   Not on file   Social Drivers of Health   Financial Resource Strain: Low Risk  (01/05/2024)   Overall Financial Resource Strain (CARDIA)    Difficulty of Paying Living Expenses: Not hard at all  Food Insecurity: No Food Insecurity (01/05/2024)   Hunger Vital Sign    Worried About Running Out of Food in the Last Year: Never true    Ran Out of Food in the Last Year: Never true  Transportation Needs: No Transportation Needs (01/05/2024)   PRAPARE - Administrator, Civil Service (Medical): No    Lack of Transportation (Non-Medical): No  Physical Activity: Inactive (01/05/2024)   Exercise Vital Sign    Days of Exercise per Week: 0 days    Minutes of Exercise per Session: Not on file  Stress: No Stress Concern Present (01/05/2024)   [employer] of Occupational Health - Occupational Stress Questionnaire    Feeling of Stress: Not at all  Social Connections: Moderately Integrated (01/05/2024)   Social Connection and Isolation Panel    Frequency of Communication with Friends and Family: More than three times a week    Frequency of Social Gatherings with Friends and Family: Twice a week    Attends Religious Services: More than 4 times per year    Active Member of Clubs or Organizations: Yes    Attends Banker Meetings: More than 4 times per year    Marital Status: Divorced    Vitals:   01/06/24 1259  BP: 116/70  Pulse: 89  Temp: 98.3 F (36.8 C)  SpO2: 97%   Body mass index is 26.19 kg/m.  Physical Exam Vitals and nursing note reviewed.  Constitutional:      General: She is not in acute distress.    Appearance: She is well-developed.  HENT:     Head: Normocephalic and atraumatic.     Mouth/Throat:     Mouth: Mucous membranes are moist.     Pharynx: Oropharynx is clear.  Eyes:     Conjunctiva/sclera: Conjunctivae  normal.  Cardiovascular:     Rate and Rhythm: Normal rate and regular rhythm.     Pulses:          Dorsalis pedis pulses are 2+ on the right side and 2+ on the left side.     Heart sounds: No murmur heard. Pulmonary:     Effort: Pulmonary effort is normal. No respiratory distress.     Breath sounds: Normal breath sounds.  Abdominal:  Palpations: Abdomen is soft. There is no hepatomegaly or mass.     Tenderness: There is no abdominal tenderness.  Musculoskeletal:     Left hip: Tenderness present. No crepitus. Decreased range of motion.  Lymphadenopathy:     Cervical: No cervical adenopathy.  Skin:    General: Skin is warm.     Findings: No erythema or rash.  Neurological:     General: No focal deficit present.     Mental Status: She is alert and oriented to person, place, and time.     Cranial Nerves: No cranial nerve deficit.     Gait: Gait normal.     Comments: Antalgic gait, not assisted.  Psychiatric:        Mood and Affect: Mood and affect normal.     ASSESSMENT AND PLAN: Essential hypertension, benign  Hip pain, chronic, left -     Meloxicam ; Take 1 tablet (7.5 mg total) by mouth daily as needed for pain.  Dispense: 30 tablet; Refill: 1 -     DG HIP UNILAT W OR W/O PELVIS 2-3 VIEWS LEFT; Future  Depression, major, recurrent, in partial remission (HCC)    Return if symptoms worsen or fail to improve.  Dua Mehler G. [place], MD  [facility]. [place] office.  Discharge Instructions   None

## 2024-01-06 NOTE — Assessment & Plan Note (Signed)
 BP adequately controlled. Continue monitoring BP regularly. Continue Losartan  50 mg daily and low salt diet.

## 2024-01-09 ENCOUNTER — Encounter: Payer: Self-pay | Admitting: Family Medicine

## 2024-01-09 NOTE — Assessment & Plan Note (Signed)
 Problem is well control. Continue Citalopram  10 mg daily, no changes today.

## 2024-01-13 ENCOUNTER — Ambulatory Visit: Payer: Self-pay | Admitting: Family Medicine

## 2024-01-23 ENCOUNTER — Ambulatory Visit
Admission: RE | Admit: 2024-01-23 | Discharge: 2024-01-23 | Disposition: A | Discharge status: Home / Self Care | Source: Ambulatory Visit | Attending: Hematology & Oncology | Admitting: Hematology & Oncology

## 2024-01-23 DIAGNOSIS — C50911 Malignant neoplasm of unspecified site of right female breast: Secondary | ICD-10-CM | POA: Diagnosis not present

## 2024-01-23 MED ORDER — GADOPICLENOL 0.5 MMOL/ML IV SOLN
8.0000 mL | Freq: Once | INTRAVENOUS | Status: AC | PRN
  Administered 2024-01-23: 8 mL via INTRAVENOUS

## 2024-01-24 ENCOUNTER — Other Ambulatory Visit: Payer: Self-pay

## 2024-01-24 ENCOUNTER — Encounter: Payer: Self-pay | Admitting: *Deleted

## 2024-01-24 DIAGNOSIS — I10 Essential (primary) hypertension: Secondary | ICD-10-CM

## 2024-01-24 MED ORDER — LOSARTAN POTASSIUM 50 MG PO TABS: 50.0000 mg | ORAL_TABLET | Freq: Every day | ORAL | 0 refills | Status: DC

## 2024-01-24 NOTE — Progress Notes (Signed)
 Reviewed patient's MRI which shows treatment response. Copy of report sent to Dr Ebbie. Message sent to to schedule follow up with Dr Ebbie for surgery discussion.   Office sent message saying patient is scheduled to see Ebbie on Monday.   Oncology Nurse Navigator Documentation     01/24/2024    1:00 PM  Oncology Nurse Navigator Flowsheets  Navigator Follow Up Date: 01/27/2024  Navigator Follow Up Reason: Review Note  Navigator Location [facility]  Navigator Encounter Type Scan Review;Appt/Treatment Plan Review  Patient Visit Type MedOnc  Treatment Phase Post-Tx Follow-up  Barriers/Navigation Needs Coordination of Care  Interventions Coordination of Care  Acuity Level 1-No Barriers  Coordination of Care Other  Support Groups/Services Friends and Family  Time Spent with Patient 15

## 2024-01-27 ENCOUNTER — Other Ambulatory Visit: Payer: Self-pay | Admitting: General Surgery

## 2024-01-27 DIAGNOSIS — C773 Secondary and unspecified malignant neoplasm of axilla and upper limb lymph nodes: Secondary | ICD-10-CM | POA: Diagnosis not present

## 2024-01-27 DIAGNOSIS — C50911 Malignant neoplasm of unspecified site of right female breast: Secondary | ICD-10-CM

## 2024-01-27 DIAGNOSIS — C50811 Malignant neoplasm of overlapping sites of right female breast: Secondary | ICD-10-CM | POA: Diagnosis not present

## 2024-01-28 ENCOUNTER — Other Ambulatory Visit: Payer: Self-pay | Admitting: Family Medicine

## 2024-01-28 ENCOUNTER — Other Ambulatory Visit: Payer: Self-pay | Admitting: General Surgery

## 2024-01-28 ENCOUNTER — Encounter: Payer: Self-pay | Admitting: *Deleted

## 2024-01-28 DIAGNOSIS — C50911 Malignant neoplasm of unspecified site of right female breast: Secondary | ICD-10-CM

## 2024-01-28 DIAGNOSIS — I10 Essential (primary) hypertension: Secondary | ICD-10-CM

## 2024-01-28 NOTE — Progress Notes (Signed)
 Patient scheduled for lumpectomy with [facility] on 02/06/2024.  Oncology Nurse Navigator Documentation     01/28/2024    7:45 AM  Oncology Nurse Navigator Flowsheets  Navigator Follow Up Date: 02/06/2024  Navigator Follow Up Reason: Surgery  Navigator Location [facility]  Navigator Encounter Type Appt/Treatment Plan Review  Patient Visit Type MedOnc  Treatment Phase Post-Tx Follow-up  Barriers/Navigation Needs Coordination of Care  Interventions None Required  Acuity Level 1-No Barriers  Support Groups/Services Friends and Family  Time Spent with Patient 15

## 2024-01-30 ENCOUNTER — Encounter (HOSPITAL_BASED_OUTPATIENT_CLINIC_OR_DEPARTMENT_OTHER): Payer: Self-pay | Admitting: General Surgery

## 2024-02-03 ENCOUNTER — Other Ambulatory Visit: Payer: Self-pay | Admitting: General Surgery

## 2024-02-03 ENCOUNTER — Encounter: Payer: Self-pay | Admitting: *Deleted

## 2024-02-03 DIAGNOSIS — C50911 Malignant neoplasm of unspecified site of right female breast: Secondary | ICD-10-CM

## 2024-02-04 ENCOUNTER — Ambulatory Visit
Admission: RE | Admit: 2024-02-04 | Discharge: 2024-02-04 | Disposition: A | Discharge status: Home / Self Care | Source: Ambulatory Visit | Attending: General Surgery | Admitting: General Surgery

## 2024-02-04 ENCOUNTER — Other Ambulatory Visit: Payer: Self-pay | Admitting: General Surgery

## 2024-02-04 ENCOUNTER — Inpatient Hospital Stay
Admission: RE | Admit: 2024-02-04 | Discharge: 2024-02-04 | Disposition: A | Source: Ambulatory Visit | Attending: General Surgery | Admitting: General Surgery

## 2024-02-04 DIAGNOSIS — C50911 Malignant neoplasm of unspecified site of right female breast: Secondary | ICD-10-CM

## 2024-02-04 DIAGNOSIS — C50811 Malignant neoplasm of overlapping sites of right female breast: Secondary | ICD-10-CM | POA: Diagnosis not present

## 2024-02-04 HISTORY — PX: BREAST BIOPSY: SHX20

## 2024-02-05 ENCOUNTER — Ambulatory Visit
Admission: RE | Admit: 2024-02-05 | Discharge: 2024-02-05 | Disposition: A | Discharge status: Home / Self Care | Source: Ambulatory Visit | Attending: General Surgery | Admitting: General Surgery

## 2024-02-05 ENCOUNTER — Other Ambulatory Visit: Payer: Self-pay | Admitting: General Surgery

## 2024-02-05 DIAGNOSIS — C50811 Malignant neoplasm of overlapping sites of right female breast: Secondary | ICD-10-CM | POA: Diagnosis not present

## 2024-02-05 DIAGNOSIS — C50911 Malignant neoplasm of unspecified site of right female breast: Secondary | ICD-10-CM

## 2024-02-05 DIAGNOSIS — R590 Localized enlarged lymph nodes: Secondary | ICD-10-CM | POA: Diagnosis not present

## 2024-02-05 HISTORY — PX: BREAST BIOPSY: SHX20

## 2024-02-05 NOTE — H&P (Signed)
 19 yof with palpable right breast mass. Last mm two years ago. She had mm that shows a 3.3 cm mass at 3 oclock with 1.2 cm satellite as well as an abnormal node. Left side with some abnormalities which have now been cleared with aspiration/biopsy. The right side has pos node and two other biopsies that are grade II IDC that is 60% er pos, 70% pr pos, her 2 negative and Ki is 15%. PET is negative. MRI shows a 2.5x2.4x3 cm mass present she is here to discuss options.  She had oncotype of 30 and has undergone primary chemotherapy which she completed four weeks ago. Repeat MRI shows no abnl nodes and tumor now 2.4x1.6x2.2 cm in size. She is here to discuss options.  Review of Systems: A complete review of systems was obtained from the patient. I have reviewed this information and discussed as appropriate with the patient. See HPI as well for other ROS.  Review of Systems  All other systems reviewed and are negative.   Medical History: Past Medical History:  Diagnosis Date  DVT (deep venous thrombosis) (CMS/HHS-HCC)  History of cancer  Hypertension   Patient Active Problem List  Diagnosis  Breast cancer metastasized to axillary lymph node, right (CMS/HHS-HCC)   Past Surgical History:  Procedure Laterality Date  shoulder surgery Right 2010  PERCUTANEOUS BIOPSY BREAST W/NEEDLE LOCALIZATION Right 08/22/2023  PERCUTANEOUS BIOPSY BREAST VACUUM ASSISTED W/NEEDLE LOCALIZATION Left 08/23/2023  bunion removal  DVT  HYSTERECTOMY   Allergies  Allergen Reactions  Sulfa (Sulfonamide Antibiotics) Rash   Current Outpatient Medications on File Prior to Visit  Medication Sig Dispense Refill  calcium carbonate-vitamin D3 (CALTRATE 600+D) 600 mg-5 mcg (200 unit) tablet Take by mouth  citalopram  (CELEXA ) 10 MG tablet  cyanocobalamin (VITAMIN B12) 1000 MCG tablet Take 1,000 mcg by mouth  Herbal Supplement Herbal Name: turmeric  Herbal Supplement Herbal Name: collagen  losartan  (COZAAR ) 50 MG tablet  Take 1 tablet by mouth once daily  multivitamin with minerals tablet Take by mouth   Family History  Problem Relation Age of Onset  Diabetes Mother  Multiple myeloma Mother  Coronary Artery Disease (Blocked arteries around heart) Father  Diabetes Brother    Social History   Tobacco Use  Smoking Status Never  Smokeless Tobacco Never  Marital status: Divorced  Tobacco Use  Smoking status: Never  Smokeless tobacco: Never  Substance and Sexual Activity  Alcohol use: Not Currently  Drug use: Never   Objective:   Physical Exam Vitals reviewed.  Constitutional:  Appearance: Normal appearance.  Chest:  Breasts: Right: No inverted nipple, mass or nipple discharge.  Left: No mass.  Comments: Small mobile medial right breast mass, difficult to find Left sided port Lymphadenopathy:  Upper Body:  Right upper body: No axillary adenopathy.  Left upper body: No axillary adenopathy.  Neurological:  Mental Status: She is alert.    Assessment and Plan:   Right breast seed guided lumpectomy, right TAD, port removal  She is had a moderate response as expected with her particular type of tumor.  I do need to get her scheduled in the next 3 weeks that she is late coming back to see me after she is completing chemotherapy we discussed the timeframe of which she need to get her scheduled.  We discussed again a sentinel lymph node biopsy with tracer as well as a radioactive seed guided excision of the prior positive node. This is the so-called targeted axillary dissection. We discussed the risk of lymphedema associated  with this. We discussed low risk of seroma postoperatively.  We also again discussed lumpectomy versus mastectomy. I do think she is a candidate for a lumpectomy at this point. We discussed the pros and cons of both as outlined in my prior note and she would like to proceed with a lumpectomy. She understands there is a risk of positive margins necessitating a second  surgery. I am going to schedule her within the next 3 weeks to put her in the window that we need to get her surgery done. I am also going to ask oncology if her port can be removed at the same time.

## 2024-02-06 ENCOUNTER — Encounter

## 2024-02-06 ENCOUNTER — Ambulatory Visit (HOSPITAL_BASED_OUTPATIENT_CLINIC_OR_DEPARTMENT_OTHER): Admitting: Anesthesiology

## 2024-02-06 ENCOUNTER — Ambulatory Visit
Admission: RE | Admit: 2024-02-06 | Discharge: 2024-02-06 | Disposition: A | Discharge status: Home / Self Care | Source: Ambulatory Visit | Attending: General Surgery | Admitting: General Surgery

## 2024-02-06 ENCOUNTER — Ambulatory Visit (HOSPITAL_BASED_OUTPATIENT_CLINIC_OR_DEPARTMENT_OTHER)
Admission: RE | Admit: 2024-02-06 | Discharge: 2024-02-06 | Disposition: A | Discharge status: Home / Self Care | Attending: General Surgery | Admitting: General Surgery

## 2024-02-06 ENCOUNTER — Encounter (HOSPITAL_BASED_OUTPATIENT_CLINIC_OR_DEPARTMENT_OTHER): Admission: RE | Disposition: A | Payer: Self-pay | Source: Home / Self Care | Attending: General Surgery

## 2024-02-06 ENCOUNTER — Other Ambulatory Visit: Payer: Self-pay

## 2024-02-06 ENCOUNTER — Encounter (HOSPITAL_BASED_OUTPATIENT_CLINIC_OR_DEPARTMENT_OTHER): Payer: Self-pay | Admitting: General Surgery

## 2024-02-06 DIAGNOSIS — I10 Essential (primary) hypertension: Secondary | ICD-10-CM | POA: Insufficient documentation

## 2024-02-06 DIAGNOSIS — G8918 Other acute postprocedural pain: Secondary | ICD-10-CM | POA: Diagnosis not present

## 2024-02-06 DIAGNOSIS — Z1721 Progesterone receptor positive status: Secondary | ICD-10-CM | POA: Diagnosis not present

## 2024-02-06 DIAGNOSIS — F418 Other specified anxiety disorders: Secondary | ICD-10-CM | POA: Diagnosis not present

## 2024-02-06 DIAGNOSIS — Z170 Estrogen receptor positive status [ER+]: Secondary | ICD-10-CM | POA: Diagnosis not present

## 2024-02-06 DIAGNOSIS — C773 Secondary and unspecified malignant neoplasm of axilla and upper limb lymph nodes: Secondary | ICD-10-CM | POA: Diagnosis not present

## 2024-02-06 DIAGNOSIS — D0511 Intraductal carcinoma in situ of right breast: Secondary | ICD-10-CM | POA: Insufficient documentation

## 2024-02-06 DIAGNOSIS — C50911 Malignant neoplasm of unspecified site of right female breast: Secondary | ICD-10-CM

## 2024-02-06 DIAGNOSIS — K219 Gastro-esophageal reflux disease without esophagitis: Secondary | ICD-10-CM | POA: Insufficient documentation

## 2024-02-06 DIAGNOSIS — Z452 Encounter for adjustment and management of vascular access device: Secondary | ICD-10-CM | POA: Diagnosis not present

## 2024-02-06 DIAGNOSIS — Z9221 Personal history of antineoplastic chemotherapy: Secondary | ICD-10-CM | POA: Diagnosis not present

## 2024-02-06 DIAGNOSIS — D0512 Intraductal carcinoma in situ of left breast: Secondary | ICD-10-CM | POA: Insufficient documentation

## 2024-02-06 DIAGNOSIS — R590 Localized enlarged lymph nodes: Secondary | ICD-10-CM | POA: Diagnosis not present

## 2024-02-06 DIAGNOSIS — C50811 Malignant neoplasm of overlapping sites of right female breast: Secondary | ICD-10-CM | POA: Diagnosis not present

## 2024-02-06 DIAGNOSIS — Z1732 Human epidermal growth factor receptor 2 negative status: Secondary | ICD-10-CM | POA: Diagnosis not present

## 2024-02-06 HISTORY — PX: PORT-A-CATH REMOVAL: SHX5289

## 2024-02-06 HISTORY — PX: RADIOACTIVE SEED GUIDED AXILLARY SENTINEL LYMPH NODE: SHX6735

## 2024-02-06 HISTORY — PX: BREAST LUMPECTOMY WITH RADIOACTIVE SEED AND SENTINEL LYMPH NODE BIOPSY: SHX6550

## 2024-02-06 SURGERY — BREAST LUMPECTOMY WITH RADIOACTIVE SEED AND SENTINEL LYMPH NODE BIOPSY
Anesthesia: General | Site: Chest | Laterality: Right

## 2024-02-06 MED ORDER — AMISULPRIDE (ANTIEMETIC) 5 MG/2ML IV SOLN: 10.0000 mg | Freq: Once | INTRAVENOUS | Status: DC | PRN

## 2024-02-06 MED ORDER — CEFAZOLIN SODIUM-DEXTROSE 2-4 GM/100ML-% IV SOLN
2.0000 g | INTRAVENOUS | Status: AC
  Administered 2024-02-06: 2 g via INTRAVENOUS

## 2024-02-06 MED ORDER — ACETAMINOPHEN 500 MG PO TABS
ORAL_TABLET | ORAL | Status: AC
  Filled 2024-02-06: qty 2

## 2024-02-06 MED ORDER — FENTANYL CITRATE (PF) 100 MCG/2ML IJ SOLN
INTRAMUSCULAR | Status: AC
  Filled 2024-02-06: qty 2

## 2024-02-06 MED ORDER — MAGTRACE LYMPHATIC TRACER
INTRAMUSCULAR | Status: DC | PRN
  Administered 2024-02-06: 2 mL via INTRAMUSCULAR

## 2024-02-06 MED ORDER — FENTANYL CITRATE (PF) 100 MCG/2ML IJ SOLN
INTRAMUSCULAR | Status: DC | PRN
  Administered 2024-02-06 (×2): 25 ug via INTRAVENOUS
  Administered 2024-02-06: 50 ug via INTRAVENOUS

## 2024-02-06 MED ORDER — BUPIVACAINE HCL (PF) 0.25 % IJ SOLN
INTRAMUSCULAR | Status: AC
  Filled 2024-02-06: qty 30

## 2024-02-06 MED ORDER — CHLORHEXIDINE GLUCONATE CLOTH 2 % EX PADS: 6.0000 | MEDICATED_PAD | Freq: Once | CUTANEOUS | Status: DC

## 2024-02-06 MED ORDER — OXYCODONE HCL 5 MG PO TABS: 5.0000 mg | ORAL_TABLET | Freq: Once | ORAL | Status: DC | PRN

## 2024-02-06 MED ORDER — LACTATED RINGERS IV SOLN: INTRAVENOUS | Status: DC

## 2024-02-06 MED ORDER — HYDROMORPHONE HCL 1 MG/ML IJ SOLN: 0.2500 mg | INTRAMUSCULAR | Status: DC | PRN

## 2024-02-06 MED ORDER — OXYCODONE HCL 5 MG/5ML PO SOLN: 5.0000 mg | Freq: Once | ORAL | Status: DC | PRN

## 2024-02-06 MED ORDER — FENTANYL CITRATE (PF) 100 MCG/2ML IJ SOLN
50.0000 ug | Freq: Once | INTRAMUSCULAR | Status: AC
  Administered 2024-02-06: 50 ug via INTRAVENOUS

## 2024-02-06 MED ORDER — EPHEDRINE SULFATE-NACL 50-0.9 MG/10ML-% IV SOSY
PREFILLED_SYRINGE | INTRAVENOUS | Status: DC | PRN
Start: 2024-02-06 — End: 2024-02-06
  Administered 2024-02-06: 10 mg via INTRAVENOUS

## 2024-02-06 MED ORDER — MIDAZOLAM HCL (PF) 2 MG/2ML IJ SOLN
2.0000 mg | Freq: Once | INTRAMUSCULAR | Status: AC
  Administered 2024-02-06: 2 mg via INTRAVENOUS

## 2024-02-06 MED ORDER — BUPIVACAINE HCL (PF) 0.25 % IJ SOLN
INTRAMUSCULAR | Status: DC | PRN
  Administered 2024-02-06: 8 mL

## 2024-02-06 MED ORDER — ONDANSETRON HCL 4 MG/2ML IJ SOLN
INTRAMUSCULAR | Status: DC | PRN
  Administered 2024-02-06: 4 mg via INTRAVENOUS

## 2024-02-06 MED ORDER — MIDAZOLAM HCL 2 MG/2ML IJ SOLN
INTRAMUSCULAR | Status: AC
  Filled 2024-02-06: qty 2

## 2024-02-06 MED ORDER — EPHEDRINE 5 MG/ML INJ
INTRAVENOUS | Status: AC
  Filled 2024-02-06: qty 5

## 2024-02-06 MED ORDER — ROPIVACAINE HCL 5 MG/ML IJ SOLN
INTRAMUSCULAR | Status: DC | PRN
  Administered 2024-02-06: 30 mL via PERINEURAL

## 2024-02-06 MED ORDER — LIDOCAINE 2% (20 MG/ML) 5 ML SYRINGE
INTRAMUSCULAR | Status: DC | PRN
  Administered 2024-02-06: 40 mg via INTRAVENOUS

## 2024-02-06 MED ORDER — DEXAMETHASONE SOD PHOSPHATE PF 10 MG/ML IJ SOLN
INTRAMUSCULAR | Status: DC | PRN
  Administered 2024-02-06: 5 mg via INTRAVENOUS

## 2024-02-06 MED ORDER — TRAMADOL HCL 50 MG PO TABS: 50.0000 mg | ORAL_TABLET | Freq: Four times a day (QID) | ORAL | 0 refills | Status: DC | PRN

## 2024-02-06 MED ORDER — CEFAZOLIN SODIUM-DEXTROSE 2-4 GM/100ML-% IV SOLN
INTRAVENOUS | Status: AC
  Filled 2024-02-06: qty 100

## 2024-02-06 MED ORDER — ENSURE PRE-SURGERY PO LIQD: 296.0000 mL | Freq: Once | ORAL | Status: DC

## 2024-02-06 MED ORDER — PROPOFOL 10 MG/ML IV BOLUS
INTRAVENOUS | Status: DC | PRN
  Administered 2024-02-06: 200 mg via INTRAVENOUS
  Administered 2024-02-06 (×2): 150 ug/kg/min via INTRAVENOUS

## 2024-02-06 MED ORDER — LIDOCAINE 2% (20 MG/ML) 5 ML SYRINGE
INTRAMUSCULAR | Status: AC
  Filled 2024-02-06: qty 5

## 2024-02-06 MED ORDER — DEXMEDETOMIDINE HCL IN NACL 80 MCG/20ML IV SOLN
INTRAVENOUS | Status: DC | PRN
Start: 2024-02-06 — End: 2024-02-06
  Administered 2024-02-06: 12 ug via INTRAVENOUS

## 2024-02-06 MED ORDER — DEXMEDETOMIDINE HCL IN NACL 80 MCG/20ML IV SOLN
INTRAVENOUS | Status: AC
  Filled 2024-02-06: qty 20

## 2024-02-06 MED ORDER — ACETAMINOPHEN 500 MG PO TABS
1000.0000 mg | ORAL_TABLET | ORAL | Status: AC
Start: 2024-02-06 — End: 2024-02-06
  Administered 2024-02-06: 1000 mg via ORAL

## 2024-02-06 SURGICAL SUPPLY — 36 items
BLADE SURG 15 STRL LF DISP TIS (BLADE) ×2 IMPLANT
CANISTER SUCT 1200ML W/VALVE (MISCELLANEOUS) IMPLANT
CHLORAPREP W/TINT 26 (MISCELLANEOUS) ×2 IMPLANT
CLIP APPLIE 9.375 MED OPEN (MISCELLANEOUS) IMPLANT
COVER BACK TABLE 60X90IN (DRAPES) ×2 IMPLANT
COVER MAYO STAND STRL (DRAPES) ×2 IMPLANT
COVER PROBE CYLINDRICAL 5X96 (MISCELLANEOUS) ×2 IMPLANT
DERMABOND ADVANCED .7 DNX12 (GAUZE/BANDAGES/DRESSINGS) ×2 IMPLANT
DRAPE LAPAROSCOPIC ABDOMINAL (DRAPES) ×2 IMPLANT
DRAPE UTILITY XL STRL (DRAPES) ×2 IMPLANT
ELECT COATED BLADE 2.86 ST (ELECTRODE) ×2 IMPLANT
ELECTRODE REM PT RTRN 9FT ADLT (ELECTROSURGICAL) ×2 IMPLANT
GLOVE BIO SURGEON STRL SZ7 (GLOVE) ×4 IMPLANT
GLOVE BIOGEL PI IND STRL 7.5 (GLOVE) ×2 IMPLANT
GOWN STRL REUS W/ TWL LRG LVL3 (GOWN DISPOSABLE) ×4 IMPLANT
HEMOSTAT ARISTA ABSORB 3G PWDR (HEMOSTASIS) IMPLANT
KIT MARKER MARGIN INK (KITS) ×2 IMPLANT
NDL HYPO 25X1 1.5 SAFETY (NEEDLE) ×2 IMPLANT
NEEDLE HYPO 25X1 1.5 SAFETY (NEEDLE) ×4 IMPLANT
NS IRRIG 1000ML POUR BTL (IV SOLUTION) IMPLANT
PACK BASIN DAY SURGERY FS (CUSTOM PROCEDURE TRAY) ×2 IMPLANT
PENCIL SMOKE EVACUATOR (MISCELLANEOUS) ×2 IMPLANT
SLEEVE SCD COMPRESS KNEE MED (STOCKING) ×2 IMPLANT
SPONGE T-LAP 4X18 ~~LOC~~+RFID (SPONGE) ×2 IMPLANT
STRIP CLOSURE SKIN 1/2X4 (GAUZE/BANDAGES/DRESSINGS) ×2 IMPLANT
SUT MNCRL AB 4-0 PS2 18 (SUTURE) ×2 IMPLANT
SUT MON AB 5-0 PS2 18 (SUTURE) IMPLANT
SUT SILK 2 0 SH (SUTURE) IMPLANT
SUT VIC AB 2-0 SH 27XBRD (SUTURE) ×2 IMPLANT
SUT VIC AB 3-0 SH 27X BRD (SUTURE) ×2 IMPLANT
SYR CONTROL 10ML LL (SYRINGE) ×2 IMPLANT
TOWEL GREEN STERILE FF (TOWEL DISPOSABLE) ×2 IMPLANT
TRACER MAGTRACE VIAL (MISCELLANEOUS) IMPLANT
TRAY FAXITRON CT DISP (TRAY / TRAY PROCEDURE) ×2 IMPLANT
TUBE CONNECTING 20X1/4 (TUBING) IMPLANT
YANKAUER SUCT BULB TIP NO VENT (SUCTIONS) IMPLANT

## 2024-02-06 NOTE — Discharge Instructions (Addendum)
 [facility] Office Phone Number [phone]  POST OP INSTRUCTIONS Take 400 mg of ibuprofen every 8 hours or 650 mg tylenol  every 6 hours for next 72 hours then as needed. Use ice several times daily also.  A prescription for pain medication may be given to you upon discharge.  Take your pain medication as prescribed, if needed.  If narcotic pain medicine is not needed, then you may take acetaminophen  (Tylenol ), naprosyn (Alleve) or ibuprofen (Advil) as needed. Take your usually prescribed medications unless otherwise directed If you need a refill on your pain medication, please contact your pharmacy.  They will contact our office to request authorization.  Prescriptions will not be filled after 5pm or on week-ends. You should eat very light the first 24 hours after surgery, such as soup, crackers, pudding, etc.  Resume your normal diet the day after surgery. Most patients will experience some swelling and bruising in the breast.  Ice packs and a good support bra will help.  Wear the breast binder provided or a sports bra for 72 hours day and night.  After that wear a sports bra during the day until you return to the office. Swelling and bruising can take several days to resolve.  It is common to experience some constipation if taking pain medication after surgery.  Increasing fluid intake and taking a stool softener will usually help or prevent this problem from occurring.  A mild laxative (Milk of Magnesia or Miralax) should be taken according to package directions if there are no bowel movements after 48 hours. I used skin glue on the incision, you may shower in 24 hours.  The glue will flake off over the next 2-3 weeks as will the steristrips.   Any sutures or staples will be removed at the office during your follow-up visit. ACTIVITIES:  You may resume regular daily activities (gradually increasing) beginning the next day.  Wearing a good support bra or sports bra minimizes pain and  swelling.  You may have sexual intercourse when it is comfortable. You may drive when you no longer are taking prescription pain medication, you can comfortably wear a seatbelt, and you can safely maneuver your car and apply brakes. RETURN TO WORK:  ______________________________________________________________________________________ [name] should see your doctor in the office for a follow-up appointment approximately two to three weeks after your surgery.  Your doctor's nurse will typically make your follow-up appointment when she calls you with your pathology report.  Expect your pathology report 3-4 business days after your surgery.  You may call to check if you do not hear from us  after three days.  WHEN TO CALL DR WAKEFIELD: Fever over 101.0 Nausea and/or vomiting. Extreme swelling or bruising. Continued bleeding from incision. Increased pain, redness, or drainage from the incision.  The clinic staff is available to answer your questions during regular business hours.  Please don't hesitate to call and ask to speak to one of the nurses for clinical concerns.  If you have a medical emergency, go to the nearest emergency room or call 911.  A surgeon from [facility] is always on call at the hospital.  For further questions, please visit [email or web address]   Post Anesthesia Home Care Instructions  Activity: Get plenty of rest for the remainder of the day. A responsible individual must stay with you for 24 hours following the procedure.  For the next 24 hours, DO NOT: -Drive a car -Advertising copywriter -Drink alcoholic beverages -Take any medication unless instructed by  your physician -Make any legal decisions or sign important papers.  Meals: Start with liquid foods such as gelatin or soup. Progress to regular foods as tolerated. Avoid greasy, spicy, heavy foods. If nausea and/or vomiting occur, drink only clear liquids until the nausea and/or vomiting subsides.  Call your physician if vomiting continues.  Special Instructions/Symptoms: Your throat may feel dry or sore from the anesthesia or the breathing tube placed in your throat during surgery. If this causes discomfort, gargle with warm salt water. The discomfort should disappear within 24 hours.  Next dose of tylenol  will be at 12:41pm.

## 2024-02-06 NOTE — Transfer of Care (Signed)
 Immediate Anesthesia Transfer of Care Note  Patient: [name]  Procedure(s) Performed: Procedure(s) (LRB): BREAST LUMPECTOMY WITH RADIOACTIVE SEED AND SENTINEL LYMPH NODE BIOPSY (Right) RADIOACTIVE SEED GUIDED AXILLARY SENTINEL LYMPH NODE BIOPSY (Right) REMOVAL PORT-A-CATH (Left)  Patient Location: PACU  Anesthesia Type: General  Level of Consciousness: awake, oriented, sedated and patient cooperative  Airway & Oxygen Therapy: Patient Spontanous Breathing and Patient connected to oxygen  Post-op Assessment: Report given to PACU RN and Post -op Vital signs reviewed and stable  Post vital signs: Reviewed and stable  Complications: No apparent anesthesia complications  Last Vitals:  Vitals Value Taken Time  BP 120/64 02/06/24 09:00  Temp    Pulse 66 02/06/24 09:08  Resp 13 02/06/24 09:08  SpO2 97 % 02/06/24 09:08  Vitals shown include unfiled device data.  Last Pain:  Vitals:   02/06/24 0637  TempSrc: Temporal  PainSc: 0-No pain      Patients Stated Pain Goal: 5 (02/06/24 9362)  Complications: No notable events documented.

## 2024-02-06 NOTE — Progress Notes (Signed)
Assisted Dr. Miller with right, pectoralis, ultrasound guided block. Side rails up, monitors on throughout procedure. See vital signs in flow sheet. Tolerated Procedure well. 

## 2024-02-06 NOTE — Anesthesia Postprocedure Evaluation (Signed)
 Anesthesia Post Note  Patient: [name]  Procedure(s) Performed: BREAST LUMPECTOMY WITH RADIOACTIVE SEED AND SENTINEL LYMPH NODE BIOPSY (Right: Breast) RADIOACTIVE SEED GUIDED AXILLARY SENTINEL LYMPH NODE BIOPSY (Right: Breast) REMOVAL PORT-A-CATH (Left: Chest)     Patient location during evaluation: PACU Anesthesia Type: General Level of consciousness: awake and alert Pain management: pain level controlled Vital Signs Assessment: post-procedure vital signs reviewed and stable Respiratory status: spontaneous breathing, nonlabored ventilation and respiratory function stable Cardiovascular status: blood pressure returned to baseline and stable Postop Assessment: no apparent nausea or vomiting Anesthetic complications: no   No notable events documented.  Last Vitals:  Vitals:   02/06/24 0959 02/06/24 1028  BP:  (!) 155/87  Pulse: 68 75  Resp: 20 16  Temp:  36.7 C  SpO2: 95% 93%    Last Pain:  Vitals:   02/06/24 1028  TempSrc: Temporal  PainSc:                  [name]

## 2024-02-06 NOTE — Interval H&P Note (Signed)
 History and Physical Interval Note:  02/06/2024 7:04 AM  [name]  has presented today for surgery, with the diagnosis of RIGHT BREAST CANCER.  The various methods of treatment have been discussed with the patient and family. After consideration of risks, benefits and other options for treatment, the patient has consented to  Procedure(s) with comments: BREAST LUMPECTOMY WITH RADIOACTIVE SEED AND SENTINEL LYMPH NODE BIOPSY (Right) - RIGHT BREAST SEED GUIDED LUMPECTOMY RIGHT AXILLARY SENTINEL NODE BIOPSY RADIOACTIVE SEED GUIDED AXILLARY SENTINEL LYMPH NODE BIOPSY (Right) - RIGHT AXILLARY NODE SEED GUIDED EXCISION REMOVAL PORT-A-CATH (Right) as a surgical intervention.  The patient's history has been reviewed, patient examined, no change in status, stable for surgery.  I have reviewed the patient's chart and labs.  Questions were answered to the patient's satisfaction.     Donnice Bury

## 2024-02-06 NOTE — Anesthesia Procedure Notes (Signed)
 Anesthesia Regional Block: Pectoralis block   Pre-Anesthetic Checklist: , timeout performed,  Correct Patient, Correct Site, Correct Laterality,  Correct Procedure, Correct Position, site marked,  Risks and benefits discussed,  Surgical consent,  Pre-op evaluation,  At surgeon's request and post-op pain management  Laterality: Right  Prep: chloraprep       Needles:  Injection technique: Single-shot  Needle Type: Stimiplex     Needle Length: 9cm  Needle Gauge: 21     Additional Needles:   Procedures:,,,, ultrasound used (permanent image in chart),,    Narrative:  Start time: 02/06/2024 6:52 AM End time: 02/06/2024 6:57 AM Injection made incrementally with aspirations every 5 mL.  Performed by: Personally  Anesthesiologist: Cleotilde Butler Dade, MD

## 2024-02-06 NOTE — Anesthesia Procedure Notes (Signed)
 Procedure Name: LMA Insertion Date/Time: 02/06/2024 7:37 AM  Performed by: Delayne Olam BIRCH, CRNAPre-anesthesia Checklist: Patient identified, Emergency Drugs available, Suction available and Patient being monitored Patient Re-evaluated:Patient Re-evaluated prior to induction Oxygen Delivery Method: Circle system utilized Preoxygenation: Pre-oxygenation with 100% oxygen Induction Type: IV induction Ventilation: Mask ventilation without difficulty LMA: LMA inserted LMA Size: 4.0 Number of attempts: 1 Airway Equipment and Method: Bite block Placement Confirmation: positive ETCO2 Tube secured with: Tape Dental Injury: Teeth and Oropharynx as per pre-operative assessment

## 2024-02-06 NOTE — Op Note (Signed)
 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  Port removal 2.  Right breast radioactive seed guided lumpectomy 3.  Right deep axillary sentinel lymph node biopsy 4.  Right axillary node seed guided excision 5.  Injection of mag trace for sentinel lymph node identification Surgeon: Dr. Adina Bury Estimated blood loss: Less than 50 cc Specimens: 1.  Right breast tissue marked with paint containing seed and clip 2.  Additional medial, lateral, superior margins marked short superior, long lateral, double deep 3.  Right targeted axillary lymph node dissection which contained the seed containing node as well as sentinel nodes together. Anesthesia: General With a pectoral block Complications: None Drains: None Sponge needle count was correct at completion Disposition to recovery in stable addition  Indications: This is a 73 year old female who presented with a palpable right breast mass.  This was a 3.3 cm mass with a satellite-abnormal node.  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  PET was negative.  MRI showed a 2.5 x 2.4 x 3 cm mass.  She underwent a Oncotype on the core biopsy and this was found to be 30.  She underwent primary chemotherapy.  Repeat MRI shows no abnormal nodes and the tumor is now 2.4 cm.  The radiologist had some difficulty localizing the 2 clips that were in the axilla.  Eventually they were only able to localize 1 of these clips.  Procedure: After informed consent was obtained she first underwent a pectoral block.  She was given antibiotics.  SCDs were placed.  She was placed under general anesthesia without complication.  She was prepped and draped in a standard sterile surgical fashion.  Surgical timeout was then  performed.  I first injected 2 cc of mag trace in the subareolar position on the right side.  This was then massaged for 5 minutes.  I removed the left sided port.  I reentered my old incision and remove the port, line, suture material in their entirety.  Hemostasis was obtained.  I closed this with 3-0 Vicryl and 4-0 Monocryl.  Glue and a Steri-Strip the later applied.  I then located the seed in the medial breast.  I made a periareolar incision in order to hide the scar later.  I then remove the seed in the surrounding tissue using the neoprobe for guidance in order to get a clear margin.  The posterior margin of this is the muscle.  I did an image that showed I remove the seed and the clip.  I then thought I was close to several margins which I removed as above.  I placed a couple of clips in this cavity.  I mobilized the breast tissue and brought together after obtaining hemostasis.  This was done with 2-0 Vicryl.  I then closed the skin with 3-0 Vicryl and 5-0 Monocryl.  Glue and Steri-Strips were eventually applied as well.  I located the seed as well as the sentinel nodes in the low axilla.  I made an incision below the axillary hairline and carried this through the axillary fascia.  I remove the radioactive seed and it appeared there was a small blood lymph nodes associated with this.  These were also sentinel nodes.  The counts were low but the counts were about in  the 210 range.  I remove these nodes.  There were no additional abnormal lymph nodes and there were no abnormal brown nodes.  I did a image and it showed that the seed and one of the clips was removed.  There really was not a way to definitively remove the other clip as it was not really seen by imaging before I could not identify anything abnormal on her axilla.  I think the 2 clips that were in the axilla have always been lymph nodes and feel like adequate sample.  I then obtained hemostasis.  I closed the axilla with 2-0 Vicryl.  I  closed the skin with 3-0 Vicryl for Monocryl.  Glue Steri-Strips were applied.  She tolerated this well was extubated and transferred recovery stable.

## 2024-02-06 NOTE — Anesthesia Preprocedure Evaluation (Addendum)
 Anesthesia Evaluation  Patient identified by MRN, date of birth, ID band Patient awake    Reviewed: Allergy & Precautions, NPO status , Patient's Chart, lab work & pertinent test results  History of Anesthesia Complications (+) PONV and history of anesthetic complications  Airway Mallampati: II  TM Distance: >3 FB Neck ROM: Full    Dental no notable dental hx.    Pulmonary neg pulmonary ROS   Pulmonary exam normal breath sounds clear to auscultation       Cardiovascular hypertension, Pt. on medications Normal cardiovascular exam Rhythm:Regular Rate:Normal     Neuro/Psych  PSYCHIATRIC DISORDERS Anxiety Depression    negative neurological ROS     GI/Hepatic Neg liver ROS,GERD  Controlled,,  Endo/Other  negative endocrine ROS    Renal/GU negative Renal ROS     Musculoskeletal  (+) Arthritis ,    Abdominal   Peds  Hematology negative hematology ROS (+)   Anesthesia Other Findings Breast Cancer  Reproductive/Obstetrics                              Anesthesia Physical Anesthesia Plan  ASA: 3  Anesthesia Plan: General   Post-op Pain Management: Regional block*   Induction: Intravenous  PONV Risk Score and Plan: 4 or greater and Ondansetron , Treatment may vary due to age or medical condition, Midazolam , Propofol  infusion and TIVA  Airway Management Planned: LMA  Additional Equipment:   Intra-op Plan:   Post-operative Plan: Extubation in OR  Informed Consent: I have reviewed the patients History and Physical, chart, labs and discussed the procedure including the risks, benefits and alternatives for the proposed anesthesia with the patient or authorized representative who has indicated his/her understanding and acceptance.     Dental advisory given  Plan Discussed with: CRNA  Anesthesia Plan Comments:          Anesthesia Quick Evaluation

## 2024-02-07 ENCOUNTER — Encounter (HOSPITAL_BASED_OUTPATIENT_CLINIC_OR_DEPARTMENT_OTHER): Payer: Self-pay | Admitting: General Surgery

## 2024-02-12 LAB — SURGICAL PATHOLOGY

## 2024-02-13 ENCOUNTER — Encounter: Payer: Self-pay | Admitting: *Deleted

## 2024-02-13 NOTE — Progress Notes (Addendum)
 Patient had lumpectomy with SLNB on 02/06/2024. Reviewed pathology with Dr Timmy.   Per Dr Timmy, request for [facility] sent on specimen [phone] DOS 02/06/2024.  Oncology Nurse Navigator Documentation     02/13/2024    7:45 AM  Oncology Nurse Navigator Flowsheets  Phase of Treatment Surgery  Surgery Actual Start Date: 02/06/2024  Navigator Follow Up Date: 02/19/2024  Navigator Follow Up Reason: Follow-up Appointment  Navigator Location [facility]  Navigator Encounter Type Pathology Review;Molecular Studies  Patient Visit Type MedOnc  Treatment Phase Pre-Tx/Tx Discussion  Barriers/Navigation Needs Coordination of Care  Interventions Coordination of Care  Acuity Level 1-No Barriers  Coordination of Care Pathology  Support Groups/Services Friends and Family  Time Spent with Patient 30

## 2024-02-14 ENCOUNTER — Telehealth: Payer: Self-pay | Admitting: Family Medicine

## 2024-02-14 DIAGNOSIS — M25551 Pain in right hip: Secondary | ICD-10-CM

## 2024-02-14 NOTE — Telephone Encounter (Signed)
 Copied from CRM [id number]. Topic: Referral - Request for Referral >> Feb 14, 2024  4:45 PM Winona R wrote: Did the patient discuss referral with their provider in the last year? Yes (If No - schedule appointment) (If Yes - send message)  Appointment offered? No, Pt states they spoke about in appointment on 09/22  Type of order/referral and detailed reason for visit: Orthopedic  Preference of office, provider, location: none, just female if possible   If referral order, have you been seen by this specialty before? Yes, [address] [place], [place], [place] [address]   Can we respond through MyChart? Yes

## 2024-02-17 NOTE — Progress Notes (Signed)
   PROVIDER:  PUJA GOSAI MACZIS, PA  MRN: [id number] DOB: 1950-11-17 DATE OF ENCOUNTER: 02/17/2024 Interval History:   [name] is a 73 y.o. female who underwent port removal, right breast seed guided lumpectomy and SLNbx on 02/06/2024 by Dr. Ebbie.  She states she noticed the swelling about 3 days ago.  The swelling is uncomfortable but she does not have much pain.  She takes Tylenol  for hip pain.  She states she had a temperature of 100.2 F 2 days ago.  Not have any fever over the weekend.  Denies redness and drainage.  Review of Systems:   ROS All other systems reviewed and are negative.  Medications:   Current Outpatient Medications on File Prior to Visit  Medication Sig Dispense Refill  . calcium carbonate-vitamin D3 (CALTRATE 600+D) 600 mg-5 mcg (200 unit) tablet Take by mouth    . citalopram  (CELEXA ) 10 MG tablet     . cyanocobalamin (VITAMIN B12) 1000 MCG tablet Take 1,000 mcg by mouth    . Herbal Supplement Herbal Name: turmeric    . Herbal Supplement Herbal Name: collagen    . losartan  (COZAAR ) 50 MG tablet Take 1 tablet by mouth once daily    . multivitamin with minerals tablet Take by mouth     No current facility-administered medications on file prior to visit.    Physical Examination:   BP 122/80   Pulse (!) 122   Temp 37.2 C (98.9 F)   Ht 170.2 cm (5' 7)   Wt 78.2 kg (172 lb 6.4 oz)   SpO2 98%   BMI 27.00 kg/m   General: Well-developed, well-nourished, in no acute distress.   Breast: Right lumpectomy incision is clean dry and intact without erythema or drainage.  No seroma or hematoma  Right axillary incision is clean dry intact without erythema or drainage.  Steri-Strips in place.  Large seroma   A chaperone, [name], CMA, was present during the exam.   Procedure:    Seroma Aspiration Procedure Note Location: Right axilla   Procedure:  The anatomy and physiology of the region was discussed.  The pathophysiology of seroma  & other fluid collections discussed.  Differential diagnosis was discussed.  Options discussed.  Recommendation was made for needle aspiration of fluid to help the seroma resolved.  Need for possible repeated aspiration for possible drain placement or even open drainage in the future was discussed.  The region was sterilely prepped.  Local anesthetic was infiltrated.  Fluid was aspirated with a sterile needle to good result.   Volume removed = 210 mL.  Nature of fluid: Clear, red, thin Patient tolerated procedure well and there were no immediate complications.   Assessment and Plan:   Diagnoses and all orders for this visit:  Breast cancer metastasized to axillary lymph node, right (CMS/HHS-HCC)    [name] is status post port removal, right breast seed guided lumpectomy and SLNbx on 02/06/2024 by Dr. Ebbie.  On exam, he has a large right axillary seroma so I recommended aspiration.  210 cc noninfected fluid was removed.  She will continue to compression bra.  She will follow-up with Dr. Ebbie on Thursday and will call with questions or concerns in the meantime.  Return for appt with surgeon as planned.  Puja Maczis, [facility]

## 2024-02-19 ENCOUNTER — Other Ambulatory Visit: Payer: Self-pay | Admitting: Oncology

## 2024-02-19 ENCOUNTER — Other Ambulatory Visit: Payer: Self-pay

## 2024-02-19 ENCOUNTER — Ambulatory Visit (HOSPITAL_BASED_OUTPATIENT_CLINIC_OR_DEPARTMENT_OTHER)
Admission: RE | Admit: 2024-02-19 | Discharge: 2024-02-19 | Disposition: A | Discharge status: Home / Self Care | Source: Ambulatory Visit | Attending: Hematology & Oncology | Admitting: Hematology & Oncology

## 2024-02-19 ENCOUNTER — Inpatient Hospital Stay

## 2024-02-19 ENCOUNTER — Other Ambulatory Visit: Payer: Self-pay | Admitting: Internal Medicine

## 2024-02-19 ENCOUNTER — Inpatient Hospital Stay: Attending: Hematology & Oncology

## 2024-02-19 ENCOUNTER — Inpatient Hospital Stay (HOSPITAL_BASED_OUTPATIENT_CLINIC_OR_DEPARTMENT_OTHER): Admitting: Hematology & Oncology

## 2024-02-19 ENCOUNTER — Encounter: Payer: Self-pay | Admitting: *Deleted

## 2024-02-19 VITALS — BP 142/81 | HR 89 | Temp 98.0°F | Resp 16 | Wt 171.8 lb

## 2024-02-19 DIAGNOSIS — C50911 Malignant neoplasm of unspecified site of right female breast: Secondary | ICD-10-CM | POA: Diagnosis not present

## 2024-02-19 DIAGNOSIS — Z17410 Hormone receptor positive with human epidermal growth factor receptor 2 positive status: Secondary | ICD-10-CM | POA: Insufficient documentation

## 2024-02-19 DIAGNOSIS — I82431 Acute embolism and thrombosis of right popliteal vein: Secondary | ICD-10-CM | POA: Diagnosis not present

## 2024-02-19 DIAGNOSIS — I82411 Acute embolism and thrombosis of right femoral vein: Secondary | ICD-10-CM | POA: Diagnosis not present

## 2024-02-19 DIAGNOSIS — C773 Secondary and unspecified malignant neoplasm of axilla and upper limb lymph nodes: Secondary | ICD-10-CM | POA: Insufficient documentation

## 2024-02-19 DIAGNOSIS — C50811 Malignant neoplasm of overlapping sites of right female breast: Secondary | ICD-10-CM | POA: Insufficient documentation

## 2024-02-19 DIAGNOSIS — C50011 Malignant neoplasm of nipple and areola, right female breast: Secondary | ICD-10-CM

## 2024-02-19 LAB — CBC WITH DIFFERENTIAL (CANCER CENTER ONLY)
Abs Immature Granulocytes: 0.03 K/uL (ref 0.00–0.07)
Basophils Absolute: 0.1 K/uL (ref 0.0–0.1)
Basophils Relative: 2 %
Eosinophils Absolute: 0.4 K/uL (ref 0.0–0.5)
Eosinophils Relative: 8 %
HCT: 36.4 % (ref 36.0–46.0)
Hemoglobin: 12.2 g/dL (ref 12.0–15.0)
Immature Granulocytes: 1 %
Lymphocytes Relative: 27 %
Lymphs Abs: 1.3 K/uL (ref 0.7–4.0)
MCH: 33.2 pg (ref 26.0–34.0)
MCHC: 33.5 g/dL (ref 30.0–36.0)
MCV: 99.2 fL (ref 80.0–100.0)
Monocytes Absolute: 0.3 K/uL (ref 0.1–1.0)
Monocytes Relative: 7 %
Neutro Abs: 2.5 K/uL (ref 1.7–7.7)
Neutrophils Relative %: 55 %
Platelet Count: 170 K/uL (ref 150–400)
RBC: 3.67 MIL/uL — ABNORMAL LOW (ref 3.87–5.11)
RDW: 12.2 % (ref 11.5–15.5)
WBC Count: 4.6 K/uL (ref 4.0–10.5)
nRBC: 0 % (ref 0.0–0.2)

## 2024-02-19 LAB — CMP (CANCER CENTER ONLY)
ALT: 12 U/L (ref 0–44)
AST: 23 U/L (ref 15–41)
Albumin: 4 g/dL (ref 3.5–5.0)
Alkaline Phosphatase: 52 U/L (ref 38–126)
Anion gap: 10 (ref 5–15)
BUN: 14 mg/dL (ref 8–23)
CO2: 24 mmol/L (ref 22–32)
Calcium: 9.3 mg/dL (ref 8.9–10.3)
Chloride: 108 mmol/L (ref 98–111)
Creatinine: 0.78 mg/dL (ref 0.44–1.00)
GFR, Estimated: >60 mL/min (ref >60)
Glucose, Bld: 101 mg/dL — ABNORMAL HIGH (ref 70–99)
Potassium: 4.4 mmol/L (ref 3.5–5.1)
Sodium: 142 mmol/L (ref 135–145)
Total Bilirubin: 0.5 mg/dL (ref 0.0–1.2)
Total Protein: 6.5 g/dL (ref 6.5–8.1)

## 2024-02-19 LAB — LACTATE DEHYDROGENASE: LDH: 195 U/L — ABNORMAL HIGH (ref 98–192)

## 2024-02-19 MED ORDER — APIXABAN (ELIQUIS) VTE STARTER PACK (10MG AND 5MG): ORAL_TABLET | ORAL | 0 refills | Status: DC

## 2024-02-19 NOTE — Progress Notes (Signed)
 I received a call from the radiology department with the patient having acute venous thrombosis involving the right mid to distal femoral vein, popliteal and calf veins.  I sent prescription of Eliquis starter dose to her pharmacy.  I left a voicemail for the patient to pick up the prescription and start the treatment as soon as possible.  She will reach out to her primary oncologist tomorrow for further recommendation.

## 2024-02-19 NOTE — Progress Notes (Signed)
 Re: extensive blood clot to right lower extremity   Received phone call from on-call services regarding results from patient's right lower DVT.  Positive for acute thrombus involving the right mid to distal femoral vein popliteal vein posterior tibial vein and peroneal veins.  Thrombus appears occlusive in the distal femoral vein, popliteal vein and peroneal vein.  Positive for occlusive thrombus within the right gastrocnemius vein.  Discussed case with Dr. Federico who recommends starting her on blood thinner and discharging from ultrasound.   Prescription sent in for Eliquis starter pack.  Patient knows to start blood thinner this evening.  Will touch base with Dr. Timmy regarding results.  Delon Hope, NP 02/19/2024 7:03 PM

## 2024-02-19 NOTE — Telephone Encounter (Signed)
 Ortho referral can be placed as requested.  I have seen her for hip pain on 01/06/24. Thanks, BJ

## 2024-02-19 NOTE — Progress Notes (Signed)
 Hematology and Oncology Follow Up Visit  [name] [id number] 07-29-1950 73 y.o. 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 twice daily-*on 02/20/2024   Interim History:  [name] is here today for follow-up.  She did undergo a lobectomy.  This was done by Dr. Ebbie on 02/06/2024.  The pathology report ([id number]) showed 1.5 cm invasive ductal carcinoma.  All margins were negative.  She had 2 positive lymph nodes.  There was no lymphovascular space invasion.    She is doing okay after surgery.  She did have some drainage of a seroma in the right axilla.  At this point now, I think she clearly needs radiotherapy.  I will speak with Dr. Shannon of Radiation Oncology and have her to be seen by him.  In talking to [name], she is not sure she really wants to take radiotherapy.  I told her that this is really helpful and that I would not think you should have a lot of side effects.  I do think that she will deafly need antiestrogen therapy in the adjuvant setting.  I would consider her more in the high risk category so I would use an aromatase inhibitor along with a CDK4/CDK6 agent.  I know that she had a very hard time with Versenio/Femara .  I probably would consider her for Aromasin/ribociclib.  She has had no problems with bowels or bladder.  She has had no cough or shortness of breath.  She has had maybe a little bit of leg swelling.  In the right leg, she does have quite a bit of swelling.  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  Will going to have to get a Doppler to see if there is any blood  clot.  Currently, I will see that her performance status is probably ECOG 1.   Medications:  Allergies as of 02/19/2024       Reactions   Sulfa Antibiotics Rash        Medication List        Accurate as of February 19, 2024 11:01 AM. If you have any questions, ask your nurse or doctor.          STOP taking these medications    ELDERBERRY PO Stopped by: Nicandro Perrault R Camarion Weier   traMADol 50 MG tablet Commonly known as: ULTRAM Stopped by: Tyreanna Bisesi R Garnett Rekowski       TAKE these medications    Calcium Carbonate-Vitamin D 600-200 MG-UNIT Tabs Take by mouth daily.   cholecalciferol 25 MCG (1000 UNIT) tablet Commonly known as: VITAMIN D3 Take 1,000 Units by mouth daily.   citalopram  10 MG tablet Commonly known as: CELEXA  Take 1 tablet (10 mg total) by mouth daily.   COLLAGEN PO Take by mouth daily.   cyanocobalamin 1000 MCG tablet Commonly known as: VITAMIN B12 Take by mouth daily.   loratadine 10 MG tablet Commonly known as: CLARITIN Take 10 mg by mouth daily.   losartan  50 MG tablet Commonly known as: COZAAR  Take 1 tablet (50 mg total) by mouth daily.   meloxicam  7.5 MG tablet Commonly known as: MOBIC  Take 1 tablet (7.5 mg total)  by mouth daily as needed for pain.   multivitamin with minerals tablet Take by mouth.   TURMERIC PO Take by mouth daily.        Allergies:  Allergies  Allergen Reactions   Sulfa Antibiotics Rash    Past Medical History, Surgical history, Social history, and Family History were reviewed and updated.  Review of Systems:  rReview of Systems  Constitutional: Negative.   HENT: Negative.    Eyes: Negative.   Respiratory: Negative.    Cardiovascular: Negative.   Gastrointestinal: Negative.   Genitourinary: Negative.   Musculoskeletal: Negative.   Skin: Negative.   Neurological: Negative.   Endo/Heme/Allergies: Negative.   Psychiatric/Behavioral: Negative.       Physical Exam:  weight is 171 lb 12.8 oz (77.9 kg). Her  oral temperature is 98 F (36.7 C). Her blood pressure is 142/81 (abnormal) and her pulse is 89. Her respiration is 16 and oxygen saturation is 100%.   Wt Readings from Last 3 Encounters:  02/19/24 171 lb 12.8 oz (77.9 kg)  02/06/24 174 lb 13.2 oz (79.3 kg)  01/06/24 167 lb 3.2 oz (75.8 kg)    Physical Exam Vitals reviewed.  Constitutional:      Comments: Her breast exam shows left breast with no masses, edema or erythema.  There is no left axillary adenopathy.  Right breast shows a well-healed lumpectomy at the areola.  This is well-healed.  There is no masses.  There is no erythema.  She has a right axillary lymphadenectomy scar.  This is also healed.    HENT:     Head: Normocephalic and atraumatic.  Eyes:     Pupils: Pupils are equal, round, and reactive to light.  Cardiovascular:     Rate and Rhythm: Normal rate and regular rhythm.     Heart sounds: Normal heart sounds.  Pulmonary:     Effort: Pulmonary effort is normal.     Breath sounds: Normal breath sounds.  Abdominal:     General: Bowel sounds are normal.     Palpations: Abdomen is soft.  Musculoskeletal:        General: No tenderness or deformity. Normal range of motion.     Cervical back: Normal range of motion.     Comments: Her legs show swelling and firmness of the right lower leg.  She has good pulses.  There is a questionable positive Toula' sign on the right side.  Left leg has very minimal swelling.  Lymphadenopathy:     Cervical: No cervical adenopathy.  Skin:    General: Skin is warm and dry.     Findings: No erythema or rash.  Neurological:     Mental Status: She is alert and oriented to person, place, and time.  Psychiatric:        Behavior: Behavior normal.        Thought Content: Thought content normal.        Judgment: Judgment normal.      Lab Results  Component Value Date   WBC 4.6 02/19/2024   HGB 12.2 02/19/2024   HCT 36.4 02/19/2024   MCV 99.2 02/19/2024   PLT 170 02/19/2024   Lab  Results  Component Value Date   FERRITIN 573 (H) 11/13/2023   IRON 122 11/13/2023   TIBC 337 11/13/2023   UIBC 215 11/13/2023   IRONPCTSAT 36 (H) 11/13/2023   Lab Results  Component Value Date   RBC 3.67 (L) 02/19/2024   No results found for: KPAFRELGTCHN, LAMBDASER, KAPLAMBRATIO No  results found for: IGGSERUM, IGA, IGMSERUM No results found for: STEPHANY CARLOTA BENSON MARKEL EARLA JOANNIE DOC VICK, SPEI   Chemistry      Component Value Date/Time   NA 142 02/19/2024 0937   NA 142 02/02/2015 0000   K 4.4 02/19/2024 0937   CL 108 02/19/2024 0937   CO2 24 02/19/2024 0937   BUN 14 02/19/2024 0937   BUN 13 02/02/2015 0000   CREATININE 0.78 02/19/2024 0937   GLU 101 02/02/2015 0000      Component Value Date/Time   CALCIUM 9.3 02/19/2024 0937   ALKPHOS 52 02/19/2024 0937   AST 23 02/19/2024 0937   ALT 12 02/19/2024 0937   BILITOT 0.5 02/19/2024 0937       Impression and Plan: Ms. Watterson is a very pleasant 73 yo caucasian postmenopausal female with locally advanced infiltrating ductal carcinoma of the right breast- ER+/PR+/HER2- --- Oncotype =30.  She received neoadjuvant chemotherapy.  She then underwent lumpectomy.  She was found to have a stage IIA ductal carcinoma.  Again, she I think radiation therapy would be very important for her.  I I did leave a message with Dr. Shannon of Radiation Oncology.  I will likely plan to start her on antiestrogen therapy after radiotherapy if she takes radiotherapy.  I would like to try to get her back probably and I would think 6 weeks.  By then, she should be in the radiotherapy and hopefully close to finishing radiotherapy.    Maude JONELLE Crease, MD 11/5/202511:01 AM  ADDENDUM: She did have a Doppler done last night.  This showed extensive DVT in the right leg.  I am really not all that surprised.  A Eliquis starter pack was called in for her.  I spoke with her this morning.  Her leg is  about the same.  She will pick up the Eliquis this morning when her pharmacy opens up.  Told her that she will take 2 pills twice a day for 1 week and then take 1 pill twice a day.  I told her that she likely will be on blood thinner the rest of her life.  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  I told her to call us  if she has any problems.  I told her to call us  if her right leg becomes worse.  She understands all of this.  Jeralyn Crease, MD

## 2024-02-19 NOTE — Progress Notes (Unsigned)
 Patient is doing fair post surgery. She is having little pain and is trying to take it easy at home. She does have a seroma that was drained on Monday and she feels like it's starting to collect again. She has another appointment tomorrow for possible drain.   She is ready to start her next treatment, which she believes to be radiation based on her conversation with Dr Ebbie. If possible, she'd like to get this finished by end of year for financial reasons. Referral order placed.   Oncology Nurse Navigator Documentation     02/19/2024   10:45 AM  Oncology Nurse Navigator Flowsheets  Navigator Follow Up Date: 04/02/2024  Navigator Follow Up Reason: Follow-up Appointment  Navigator Location [facility]  Navigator Encounter Type Follow-up Appt  Patient Visit Type MedOnc  Treatment Phase Active Tx  Barriers/Navigation Needs Coordination of Care  Education Preparing for Upcoming Surgery/ Treatment;Pain/ Symptom Management  Interventions Education;Psycho-Social Support;Referrals  Acuity Level 1-No Barriers  Referrals Radiation Oncology  Education Method Verbal  Support Groups/Services Friends and Family  Time Spent with Patient 30

## 2024-02-20 ENCOUNTER — Encounter: Payer: Self-pay | Admitting: Hematology & Oncology

## 2024-02-20 NOTE — Telephone Encounter (Signed)
 Patient notified Ortho referral has been placed. Patient requested either a female provider or [name] at [facility]. Request placed in referral.

## 2024-02-21 ENCOUNTER — Other Ambulatory Visit: Payer: Self-pay

## 2024-02-22 ENCOUNTER — Other Ambulatory Visit: Payer: Self-pay | Admitting: Family Medicine

## 2024-02-22 DIAGNOSIS — I10 Essential (primary) hypertension: Secondary | ICD-10-CM

## 2024-02-24 ENCOUNTER — Encounter (HOSPITAL_COMMUNITY): Payer: Self-pay

## 2024-02-25 NOTE — Progress Notes (Addendum)
 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 female who is seen as a courtesy of Dr. Ebbie for an opinion concerning radiation therapy as part of management for her recently diagnosed right breast cancer. The patient complained of a breast mass for which she underwent a breast mammogram on 08/21/2023 showing a 3.3 cm palpable spiculated mass in the right breast at 3 o'clock is highly suggestive of malignancy; a 1.2 cm indistinct hypoechoic round mass in the right axilla is suspicious for a satellite nodule rather than completely replaced lymph node; a right axillary lymph node with focal cortical thickening measuring up to 5.5 mm is suspicious; a 1.3 cm complicated cyst with internal nodule versus debris in the left breast at 12 o'clock 4 cm from the nipple is indeterminate; and a 1 cm mixed echogenicity oval mass with angular margins left breast at 7 o'clock 4 cm from the nipple which is also suspicious.  She underwent a needle core biopsy of right breast on 08/22/23 showing invasive ductal mammary carcinoma and intermediate grade DCIS. Right axilla and low anterior axilla, both positive for invasive carcinoma. Estrogen Receptor:  60%, positive, strong staining intensity. progesterone receptor: 70%, positive, strong staining intensity. Proliferation Marker Ki67: 15 %. Her2 (2+). Left breast biopsy were negative for malignancy.  Oncotype DX was obtained on the final surgical sample and the recurrence score of 30 predicts a risk of recurrence outside the breast over the next 9 years of 17%, if the  patient's only systemic therapy is an antiestrogen for 5 years.  It also predicts benefit from chemotherapy.  She then underwent a PET scan on 09/11/23 showing a hypermetabolic 2.3 cm right-sided breast mass and 2 clearly hypermetabolic right-sided axillary lymph nodes consistent with spread of disease.  She was then referred to Dr. Ebbie and Dr. Timmy, who suggested undergoing 4 cycles of chemotherapy with Taxotere /Cytoxan  and antiestrogen therapy with Femara  and Verzenio .  prior to surgical interventions.   Following her completion of chemotherapy, she then underwent a right breast lumpectomy with radioactive seed and node biopsy under the care of Dr. Ebbie on 02/06/2024. Surgical pathology showed grade 2, 1.5 cm invasive ductal carcinoma without lymphovascular space invasion and high nuclear grade DCIS.  All margins were negative.  Lymph node biopsy indicated 2 positive lymph nodes. Prognostic indicators significant for: estrogen receptor Positive, 60% ; progesterone receptor Positive, 70% ; Proliferation marker Ki67 at 15%; Her2 status negative by hybridization, Equivocal, 2+ by immunohistochemistry ; Grade 2.   During her most recent visit with Dr. Timmy, he recommended pt undergo radiation therapy followed by antiestrogen therapy with Aromasin/ribociclib.    Of note: she was recently found to have a DVT in the right leg and was started on Eliquis.   Patient reports to be doing well overall since her surgery. She note mild pain to her right axilla, where her seroma was drained.  She has not had to take anything for her pain.  She is still wearing her compression bra. she denies any issues with range of motion in her right shoulder.  She denies any breast or right arm swelling.  PREVIOUS RADIATION THERAPY: No  PAST MEDICAL HISTORY:  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    Complication of anesthesia    Depression    History of DVT of lower extremity 2007,2009   left   Hypertension    PONV (postoperative nausea and vomiting)    Skin cancer    skin cancers removed    PAST SURGICAL HISTORY: Past Surgical History:  Procedure Laterality Date   ABDOMINAL HYSTERECTOMY  2007   BREAST BIOPSY Right 08/22/2023   US  RT BREAST BX W LOC DEV 1ST LESION IMG BX SPEC US  GUIDE 08/22/2023 [facility]   BREAST BIOPSY Left 08/23/2023   US  LT BREAST BX W LOC DEV 1ST LESION IMG BX SPEC US  GUIDE 08/23/2023 [facility]   BREAST BIOPSY  02/04/2024   US  RT RADIOACTIVE SEED LOC 02/04/2024 [facility]   BREAST BIOPSY  02/05/2024   US  RT RADIOACTIVE SEED LOC 02/05/2024 [facility]   BREAST LUMPECTOMY WITH RADIOACTIVE SEED AND SENTINEL LYMPH NODE BIOPSY Right 02/06/2024   Procedure: BREAST LUMPECTOMY WITH RADIOACTIVE SEED AND SENTINEL LYMPH NODE BIOPSY;  Surgeon: Ebbie Cough, MD;  Location: [facility];  Service: General;  Laterality: Right;  RIGHT BREAST SEED GUIDED LUMPECTOMY RIGHT AXILLARY SENTINEL NODE BIOPSY   BREAST LUMPECTOMY WITH RADIOACTIVE SEED LOCALIZATION Right 02/18/2014   Procedure: RIGHT BREAST DUCT EXCISION AND RADIOACTIVE SEED LOCALIZED BREAST LUMPECTOMY;  Surgeon: Krystal Russell, MD;  Location: [facility];  Service: General;  Laterality: Right;   BUNIONECTOMY     left   DILATION AND CURETTAGE OF UTERUS     DVT  2009   thrombectomy lt ll   PORT-A-CATH REMOVAL Left 02/06/2024   Procedure: REMOVAL PORT-A-CATH;  Surgeon: Ebbie Cough, MD;  Location: [facility];  Service: General;  Laterality: Left;   PORTACATH PLACEMENT Left 10/21/2023   Procedure: INSERTION, TUNNELED CENTRAL VENOUS DEVICE, WITH PORT;  Surgeon: Ebbie Cough, MD;  Location: [facility];  Service: General;  Laterality: Left;  PORT PLACEMENT WITH ULTRASOUND GUIDANCE LMA   RADIOACTIVE SEED GUIDED AXILLARY  SENTINEL LYMPH NODE Right 02/06/2024   Procedure: RADIOACTIVE SEED GUIDED AXILLARY SENTINEL LYMPH NODE BIOPSY;  Surgeon: Ebbie Cough, MD;  Location: [facility];  Service: General;  Laterality: Right;  RIGHT AXILLARY NODE SEED GUIDED EXCISION   right shoulder  2010   RCR-shoulder   THROMBECTOMY  2009   left common iliac vein-stent   TUBAL LIGATION      FAMILY HISTORY:  Family History  Problem Relation Age of Onset   Diabetes Mother    Cancer Mother        multiple myloma   Heart disease Father    Cancer Brother        colon   Diabetes Brother    Cancer Brother        myeloma   Diabetes Brother    Diabetes Brother    Diabetes Brother    Diabetes Brother     SOCIAL HISTORY:  Social History   Tobacco Use   Smoking status: Never   Smokeless tobacco: Never  Vaping Use   Vaping status: Never Used  Substance Use Topics   Alcohol use: Yes    Comment: rarely   Drug use: No    ALLERGIES:  Allergies  Allergen Reactions   Sulfa Antibiotics Rash  MEDICATIONS:  Current Outpatient Medications  Medication Sig Dispense Refill   APIXABAN (ELIQUIS) VTE STARTER PACK (10MG  AND 5MG ) Take as directed on package: start with two-5mg  tablets twice daily for 7 days. On day 8, switch to one-5mg  tablet twice daily. 74 each 0   Calcium Carbonate-Vitamin D 600-200 MG-UNIT TABS Take by mouth daily.     cholecalciferol (VITAMIN D3) 25 MCG (1000 UNIT) tablet Take 1,000 Units by mouth daily.     citalopram  (CELEXA ) 10 MG tablet Take 1 tablet (10 mg total) by mouth daily. 90 tablet 0   Collagen-Vitamin C-Biotin (COLLAGEN PO) Take by mouth daily. (Patient not taking: Reported on 02/26/2024)     loratadine (CLARITIN) 10 MG tablet Take 10 mg by mouth daily.     losartan  (COZAAR ) 50 MG tablet Take 1 tablet by mouth once daily 30 tablet 0   meloxicam  (MOBIC ) 7.5 MG tablet Take 1 tablet (7.5 mg total) by mouth daily as needed for pain. 30 tablet 1   Multiple Vitamins-Minerals  (MULTIVITAMIN WITH MINERALS) tablet Take by mouth.     TURMERIC PO Take by mouth daily. (Patient not taking: Reported on 02/26/2024)     vitamin B-12 (CYANOCOBALAMIN) 1000 MCG tablet Take by mouth daily.     No current facility-administered medications for this encounter.    REVIEW OF SYSTEMS: Notable for that above.    PHYSICAL EXAM:  height is 5' 7 (1.702 m) and weight is 175 lb 3.2 oz (79.5 kg). Her temperature is 97.3 F (36.3 C) (abnormal). Her blood pressure is 141/83 (abnormal) and her pulse is 89. Her respiration is 17 and oxygen saturation is 98%.   General: Alert and oriented, in no acute distress HEENT: Head is normocephalic. Extraocular movements are intact.  Neck: Neck is supple, no palpable cervical or supraclavicular lymphadenopathy. Heart: Regular in rate and rhythm with no murmurs, rubs, or gallops. Chest: Clear to auscultation bilaterally, with no rhonchi, wheezes, or rales. Abdomen: Soft, nontender, nondistended, with no rigidity or guarding. Extremities: No cyanosis or edema. Lymphatics: see Neck Exam Skin: No concerning lesions. Musculoskeletal: symmetric strength and muscle tone throughout. Neurologic: Cranial nerves II through XII are grossly intact. No obvious focalities. Speech is fluent. Coordination is intact. Psychiatric: Judgment and insight are intact. Affect is appropriate. Surgical scars along the right breast and axilla healing well without signs of drainage or infection  ECOG = 0  0 - Asymptomatic (Fully active, able to carry on all predisease activities without restriction)  1 - Symptomatic but completely ambulatory (Restricted in physically strenuous activity but ambulatory and able to carry out work of a light or sedentary nature. For example, light housework, office work)  2 - Symptomatic, <50% in bed during the day (Ambulatory and capable of all self care but unable to carry out any work activities. Up and about more than 50% of waking  hours)  3 - Symptomatic, >50% in bed, but not bedbound (Capable of only limited self-care, confined to bed or chair 50% or more of waking hours)  4 - Bedbound (Completely disabled. Cannot carry on any self-care. Totally confined to bed or chair)  5 - Death   Raylene MM, Creech RH, Tormey DC, et al. [phone]). Toxicity and response criteria of the [facility]. Am. DOROTHA Bridges. Oncol. 5 (6): 649-55  LABORATORY DATA:  Lab Results  Component Value Date   WBC 4.6 02/19/2024   HGB 12.2 02/19/2024   HCT 36.4 02/19/2024   MCV 99.2 02/19/2024   PLT  170 02/19/2024   NEUTROABS 2.5 02/19/2024   Lab Results  Component Value Date   NA 142 02/19/2024   K 4.4 02/19/2024   CL 108 02/19/2024   CO2 24 02/19/2024   GLUCOSE 101 (H) 02/19/2024   BUN 14 02/19/2024   CREATININE 0.78 02/19/2024   CALCIUM 9.3 02/19/2024      RADIOGRAPHY: US  Venous Img Lower Unilateral Right (DVT) Result Date: 02/19/2024 EXAM: ULTRASOUND DUPLEX OF THE RIGHT LOWER EXTREMITY VEINS TECHNIQUE: Duplex ultrasound using B-mode/gray scaled imaging and Doppler spectral analysis and color flow was obtained of the deep venous structures of the right lower extremity. COMPARISON: None available. CLINICAL HISTORY: Breast cancer, surgery 2 weeks ago. Swollen RIGHT leg and pain. FINDINGS: The common femoral vein of the right lower extremity demonstrates normal compressibility with normal color flow and spectral analysis. Positive for acute thrombus involving the right mid to distal femoral vein, popliteal vein, posterior tibial vein, and peroneal veins. Thrombus appears occlusive in the distal femoral vein, popliteal vein, and peroneal veins. Positive for occlusive thrombus within the right gastrocnemius vein. Other visualized veins not specifically mentioned with thrombus demonstrate normal compressibility and Doppler flow. IMPRESSION: 1. Acute deep venous thrombosis involving the right mid to distal femoral vein, popliteal  vein, and calf veins 2. These results will be called to the ordering clinician or representative by the Radiologist Assistant, and communication documented in the PACS or [employer]. Electronically signed by: Luke Bun MD 02/19/2024 06:31 PM EST RP Workstation: [id number]   MM Breast Surgical Specimen Result Date: 02/06/2024 CLINICAL DATA:  Post lumpectomy and axillary dissection specimen radiographs. EXAM: SPECIMEN RADIOGRAPH OF THE RIGHT BREAST SPECIMEN RADIOGRAPH OF THE RIGHT BREAST COMPARISON:  Previous exam(s). FINDINGS: Status post excision of the right breast and right axilla. The radioactive seed and ribbon biopsy marker clip from the right breast are present and completely intact. The HydroMARK biopsy clip and radioactive seed from the right axilla are present and completely intact. The heart biopsy clip in the right axilla was not removed. IMPRESSION: Specimen radiograph of the right breast and right axilla. Findings were discussed with Dr. Ebbie by epic messaging on 02/06/2024. Electronically Signed   By: Inocente Ast M.D.   On: 02/06/2024 09:44   MM Breast Surgical Specimen Result Date: 02/06/2024 CLINICAL DATA:  Post lumpectomy and axillary dissection specimen radiographs. EXAM: SPECIMEN RADIOGRAPH OF THE RIGHT BREAST SPECIMEN RADIOGRAPH OF THE RIGHT BREAST COMPARISON:  Previous exam(s). FINDINGS: Status post excision of the right breast and right axilla. The radioactive seed and ribbon biopsy marker clip from the right breast are present and completely intact. The HydroMARK biopsy clip and radioactive seed from the right axilla are present and completely intact. The heart biopsy clip in the right axilla was not removed. IMPRESSION: Specimen radiograph of the right breast and right axilla. Findings were discussed with Dr. Ebbie by epic messaging on 02/06/2024. Electronically Signed   By: Inocente Ast M.D.   On: 02/06/2024 09:44   US  RT RADIOACTIVE SEED LOC Result  Date: 02/06/2024 CLINICAL DATA:  73 year old female presenting for seed localization of the right axilla. Patient had 2 positive biopsies in the right axilla, 1 a metastatic lymph node and the 2nd showing invasive mammary carcinoma in May 2025. She has subsequently undergone neoadjuvant chemotherapy. EXAM: ULTRASOUND GUIDED RADIOACTIVE SEED LOCALIZATION OF THE RIGHT AXILLA COMPARISON:  Previous exam(s). FINDINGS: Patient presents for radioactive seed localization prior to . I met with the patient and we discussed the procedure of seed localization  including benefits and alternatives. We discussed the high likelihood of a successful procedure. We discussed the risks of the procedure including infection, bleeding, tissue injury and further surgery. We discussed the low dose of radioactivity involved in the procedure. Informed, written consent was given. The usual time-out protocol was performed immediately prior to the procedure. Using ultrasound guidance, sterile technique, 1% lidocaine  and an I-125 radioactive seed, the biopsied lymph node in the right axilla was localized using a lateral approach. The follow-up mammogram images confirm the seed in the expected location and were marked for Dr. Ebbie. Follow-up survey of the patient confirms presence of the radioactive seed. Order number of I-125 seed:  [id number]. Total activity: 0.257 mCi reference Date: 01/02/2024 The previously biopsied mass/heart clip in the right axilla could not be visualized under ultrasound following neoadjuvant chemotherapy. The patient tolerated the procedure well and was released from the [facility]. She was given instructions regarding seed removal. IMPRESSION: Radioactive seed localization right axilla. No apparent complications. Electronically Signed   By: Inocente Ast M.D.   On: 02/06/2024 07:56   MM CLIP PLACEMENT RIGHT Result Date: 02/05/2024 CLINICAL DATA:  Post procedure mammogram to confirm seed placement. EXAM:  DIAGNOSTIC RIGHT MAMMOGRAM POST ULTRASOUND-GUIDED RADIOACTIVE SEED PLACEMENT COMPARISON:  Previous exam(s). ACR Breast Density Category c: The breasts are heterogeneously dense, which may obscure small masses. FINDINGS: Mammographic images were obtained following ultrasound-guided radioactive seed placement. The radioactive seed is appropriately positioned adjacent to the spiral clip at the site of the biopsied lymph node in the right axilla. This seed is located 1.7 cm posterior to the heart clip. IMPRESSION: Appropriate location of the radioactive seed at the site of the biopsied lymph node in the right axilla. Final Assessment: Post Procedure Mammograms for Seed Placement BI-RADS CATEGORY  79M: Post-Procedure Mammogram for Marker Placement Electronically Signed   By: Inocente Ast M.D.   On: 02/05/2024 12:34   MM CLIP PLACEMENT RIGHT Result Date: 02/04/2024 CLINICAL DATA:  Mammographic image following radioactive seed localization of the right breast. EXAM: DIAGNOSTIC RIGHT MAMMOGRAM POST ULTRASOUND-GUIDED RADIOACTIVE SEED PLACEMENT COMPARISON:  Previous exam(s). ACR Breast Density Category c: The breasts are heterogeneously dense, which may obscure small masses. FINDINGS: Mammographic images were obtained following ultrasound-guided radioactive seed placement. These demonstrate the radioactive seed is in appropriate position in the 3 o'clock region of the right breast. IMPRESSION: Appropriate location of the radioactive seed. Final Assessment: Post Procedure Mammograms for Seed Placement BI-RADS CATEGORY  79M: Post-Procedure Mammogram for Marker Placement Electronically Signed   By: Harrie Deis M.D.   On: 02/04/2024 11:26   US  RT RADIOACTIVE SEED LOC Result Date: 02/04/2024 CLINICAL DATA:  Biopsy proven invasive mammary carcinoma in the 3 o'clock region of the right breast 2 cm from the nipple (ribbon shaped clip). Radioactive seed localization. Patient also has biopsy proven metastatic carcinoma  involving a lymph node and invasive mammary carcinoma in the low anterior right axilla. EXAM: ULTRASOUND GUIDED RADIOACTIVE SEED LOCALIZATION OF THE RIGHT BREAST COMPARISON:  Previous exam(s). FINDINGS: Patient presents for radioactive seed localization prior to surgery. I met with the patient and we discussed the procedure of seed localization including benefits and alternatives. We discussed the high likelihood of a successful procedure. We discussed the risks of the procedure including infection, bleeding, tissue injury and further surgery. We discussed the low dose of radioactivity involved in the procedure. Informed, written consent was given. The usual time-out protocol was performed immediately prior to the procedure. Prior to seed localization of the  mass in the 3 o'clock region of the right breast 2 cm from the nipple. Sonographic evaluation the right axilla was performed. No right axillary lymph node or mass identified therefore localization of these areas can not be performed. Using ultrasound guidance, sterile technique, 1% lidocaine  and an I-125 radioactive seed, the mass in the 3 o'clock region of the right breast 2 cm from the nipple was localized using a inferior to superior approach. The follow-up mammogram images confirm the seed in the expected location and were marked for Dr. Ebbie. Follow-up survey of the patient confirms presence of the radioactive seed. Order number of I-125 seed:  [id number]. Total activity:  0.257 millicuries reference Date: 01/02/2024 The patient tolerated the procedure well and was released from the [facility]. She was given instructions regarding seed removal. IMPRESSION: Radioactive seed localization right breast. No apparent complications. Sonographic evaluation the right axilla was performed prior to seed localization. The mass in the low right axilla and right axillary lymph node that were previously biopsied were not sonographically occult (the biopsy marker  clips also could not be identified) therefore radioactive seed placement in these areas could not be performed. Electronically Signed   By: Dina  Arceo M.D.   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   It was a pleasure meeting this patient today.  We reviewed her current workup.  Surgical pathology demonstrates pT1c, pN52mi disease, with less than 1 mm posterior and inferior margins, and 2/2 lymph nodes positive for micrometastatic disease. Given these findings, Dr. Shannon recommends adjuvant radiation to reduce the risk of locoregional disease recurrence.  Given the small volume of residual disease with micrometastasis in the axilla,  we do not feel there would be significant benefit from going back for axillary lymph node dissection.  Patient is agreeable and does not wish to have additional surgery.  Today, I talked to the patient and family about the findings and work-up thus far.  We discussed the natural history of breast cancer and general treatment, highlighting the role of radiotherapy in the management.  We discussed the available radiation techniques, and focused on the details of logistics and delivery.  We reviewed the anticipated acute and late sequelae associated with radiation in this setting.  The patient was encouraged to ask questions that I answered to the best of my ability. A patient consent form was discussed and signed.  We retained a copy for our records.  The patient would like to proceed with radiation and will be scheduled for CT simulation.  PLAN: Patient is scheduled to see Dr. Ebbie for post-op follow-up on 03/06/2024. We will tentatively schedule the patient for treatment planning on 03/09/2024.     60 minutes of total time was spent for this patient encounter, including preparation,  face-to-face counseling with the patient and coordination of care, physical exam, and documentation of the encounter.   ------------------------------------------------   Leeroy Due, PA-C   Lynwood CHARM Shannon, PhD, MD   [facility]  Radiation Oncology Direct Dial: [phone]  Fax: [phone] [email or web address]    This document serves as a record of services personally performed by Lynwood Shannon, MD. It was created on his behalf by Reymundo Cartwright, a trained medical scribe. The creation of this record is based on the scribe's personal observations and the provider's statements to them. This document has been  checked and approved by the attending provider.

## 2024-02-25 NOTE — Progress Notes (Signed)
 Location of Breast Cancer:right   Histology per Pathology Report:    Receptor Status: ER(60), PR (70), Her2-neu (), Ki-67(15)  Did patient present with symptoms (if so, please note symptoms) or was this found on screening mammography?: ***  Past/Anticipated interventions by surgeon, if any:   Past/Anticipated interventions by medical oncology, if any:    Lymphedema issues, if any:  {:18581} {t:21944}   Pain issues, if any:  {:18581} {PAIN DESCRIPTION:21022940}  SAFETY ISSUES: Prior radiation? {:18581} Pacemaker/ICD? {:18581} Possible current pregnancy?{:18581} Is the patient on methotrexate? {:18581}  Current Complaints / other details:  ***

## 2024-02-26 ENCOUNTER — Ambulatory Visit
Admission: RE | Admit: 2024-02-26 | Discharge: 2024-02-26 | Disposition: A | Discharge status: Home / Self Care | Source: Ambulatory Visit | Attending: Radiation Oncology | Admitting: Radiation Oncology

## 2024-02-26 ENCOUNTER — Encounter: Payer: Self-pay | Admitting: Radiation Oncology

## 2024-02-26 VITALS — BP 141/83 | HR 89 | Temp 97.3°F | Resp 17 | Ht 67.0 in | Wt 175.2 lb

## 2024-02-26 DIAGNOSIS — I10 Essential (primary) hypertension: Secondary | ICD-10-CM | POA: Insufficient documentation

## 2024-02-26 DIAGNOSIS — Z1731 Human epidermal growth factor receptor 2 positive status: Secondary | ICD-10-CM | POA: Diagnosis not present

## 2024-02-26 DIAGNOSIS — Z170 Estrogen receptor positive status [ER+]: Secondary | ICD-10-CM | POA: Diagnosis not present

## 2024-02-26 DIAGNOSIS — C50311 Malignant neoplasm of lower-inner quadrant of right female breast: Secondary | ICD-10-CM

## 2024-02-26 DIAGNOSIS — Z86718 Personal history of other venous thrombosis and embolism: Secondary | ICD-10-CM | POA: Insufficient documentation

## 2024-02-26 DIAGNOSIS — C773 Secondary and unspecified malignant neoplasm of axilla and upper limb lymph nodes: Secondary | ICD-10-CM | POA: Diagnosis not present

## 2024-02-26 DIAGNOSIS — I82401 Acute embolism and thrombosis of unspecified deep veins of right lower extremity: Secondary | ICD-10-CM | POA: Insufficient documentation

## 2024-02-26 DIAGNOSIS — Z1721 Progesterone receptor positive status: Secondary | ICD-10-CM | POA: Diagnosis not present

## 2024-02-26 DIAGNOSIS — Z7901 Long term (current) use of anticoagulants: Secondary | ICD-10-CM | POA: Diagnosis not present

## 2024-02-26 DIAGNOSIS — Z79899 Other long term (current) drug therapy: Secondary | ICD-10-CM | POA: Insufficient documentation

## 2024-02-26 DIAGNOSIS — M129 Arthropathy, unspecified: Secondary | ICD-10-CM | POA: Diagnosis not present

## 2024-02-26 DIAGNOSIS — C50911 Malignant neoplasm of unspecified site of right female breast: Secondary | ICD-10-CM

## 2024-02-26 DIAGNOSIS — Z85828 Personal history of other malignant neoplasm of skin: Secondary | ICD-10-CM | POA: Insufficient documentation

## 2024-02-26 NOTE — Addendum Note (Signed)
 Encounter addended by: Dyana Norse, LPN on: 88/87/7974 4:36 PM  Actions taken: Visit diagnoses modified

## 2024-03-01 ENCOUNTER — Other Ambulatory Visit: Payer: Self-pay | Admitting: Family Medicine

## 2024-03-01 DIAGNOSIS — G8929 Other chronic pain: Secondary | ICD-10-CM

## 2024-03-02 ENCOUNTER — Encounter: Payer: Self-pay | Admitting: *Deleted

## 2024-03-02 NOTE — Progress Notes (Signed)
 Received a call from the patient with question about her meds. She is taking Eliquis in addition to Meloxicam  7.5mg  daily. She wants to make sure these two medications are okay to be on concurrently.   Spoke to Dr Timmy. He instructs that patient take her meloxicam  with a meal to prevent stomach irritation, but otherwise is okay with both medications.   This is educated to the patient. She is thankful for the education.   Oncology Nurse Navigator Documentation     03/02/2024    1:00 PM  Oncology Nurse Navigator Flowsheets  Navigator Follow Up Date: 04/02/2024  Navigator Follow Up Reason: Follow-up Appointment  Navigator Location [facility]  Navigator Encounter Type Telephone  Patient Visit Type MedOnc  Treatment Phase Active Tx  Barriers/Navigation Needs Coordination of Care  Interventions Education  Acuity Level 1-No Barriers  Education Method Verbal  Support Groups/Services Friends and Family  Time Spent with Patient 15

## 2024-03-03 ENCOUNTER — Other Ambulatory Visit

## 2024-03-03 ENCOUNTER — Telehealth: Payer: Self-pay | Admitting: Family Medicine

## 2024-03-03 NOTE — Telephone Encounter (Signed)
 Patient needs to be provided with address if where to go for her appt.  She also needs to be scheduled for a Bone Density.

## 2024-03-03 NOTE — Telephone Encounter (Signed)
 Copied from CRM #[id number]. Topic: Referral - Question >> Mar 03, 2024  8:37 AM [name] wrote: Reason for CRM: Patient would like clarification as to where her bone density order got sent to. [facility] and [facility] not do [facility] density tests, but agent was able [facility] [address] Please call patient with address.

## 2024-03-04 NOTE — Telephone Encounter (Signed)
 LVM for pt to return call to office to schedule a bone density.  Patient needs to be provided the [address] address for the Bone Density visit.    *CRM # [id number]

## 2024-03-05 ENCOUNTER — Other Ambulatory Visit: Payer: Self-pay

## 2024-03-06 NOTE — Progress Notes (Signed)
   PROVIDER:  DONNICE CARLIN BURY, MD  MRN: [id number] DOB: Nov 08, 1950 DATE OF ENCOUNTER: 03/06/2024 Interval History:     73 year old female who underwent primary chemotherapy. She then underwent a lumpectomy as well as a targeted lymph node removal. She is doing okay. She had a seroma that was drained last week. This is recurred to some degree but is much better. She has no other issues. She did have a DVT diagnosed by her medical oncologist yesterday and is now on anticoagulation. Her pathology from surgery showed a residual multifocal invasive ductal carcinoma with the largest focus being 1.5 cm. Her margins are clear. She had 2 lymph nodes that ended up being removed that showed micrometastases in both of those. These measure 1 and 1.5 mm. I aspirated seroma on 11/6.     Physical Examination:   Physical Exam   Incisions are healing well no infection there is no more seroma.   Assessment and Plan:     She has radiation oncology simulation set up.  There is no more seroma to drain.  I will plan on seeing her back in 1 year for routine follow-up.  MATTHEW CARLIN BURY, MD

## 2024-03-10 ENCOUNTER — Ambulatory Visit
Admission: RE | Admit: 2024-03-10 | Discharge: 2024-03-10 | Disposition: A | Discharge status: Home / Self Care | Source: Ambulatory Visit | Attending: Radiation Oncology | Admitting: Radiation Oncology

## 2024-03-10 DIAGNOSIS — C50811 Malignant neoplasm of overlapping sites of right female breast: Secondary | ICD-10-CM | POA: Diagnosis not present

## 2024-03-10 DIAGNOSIS — C773 Secondary and unspecified malignant neoplasm of axilla and upper limb lymph nodes: Secondary | ICD-10-CM | POA: Diagnosis not present

## 2024-03-10 DIAGNOSIS — C50911 Malignant neoplasm of unspecified site of right female breast: Secondary | ICD-10-CM

## 2024-03-10 DIAGNOSIS — Z510 Encounter for antineoplastic radiation therapy: Secondary | ICD-10-CM | POA: Diagnosis not present

## 2024-03-11 DIAGNOSIS — M160 Bilateral primary osteoarthritis of hip: Secondary | ICD-10-CM | POA: Diagnosis not present

## 2024-03-16 ENCOUNTER — Other Ambulatory Visit: Payer: Self-pay | Admitting: Family Medicine

## 2024-03-16 DIAGNOSIS — F3341 Major depressive disorder, recurrent, in partial remission: Secondary | ICD-10-CM

## 2024-03-18 ENCOUNTER — Other Ambulatory Visit: Payer: Self-pay | Admitting: *Deleted

## 2024-03-18 MED ORDER — APIXABAN 5 MG PO TABS: 5.0000 mg | ORAL_TABLET | Freq: Two times a day (BID) | ORAL | 6 refills | Status: AC

## 2024-03-21 ENCOUNTER — Other Ambulatory Visit: Payer: Self-pay | Admitting: Family Medicine

## 2024-03-21 DIAGNOSIS — I10 Essential (primary) hypertension: Secondary | ICD-10-CM

## 2024-03-23 ENCOUNTER — Ambulatory Visit: Admitting: Radiation Oncology

## 2024-03-23 DIAGNOSIS — C773 Secondary and unspecified malignant neoplasm of axilla and upper limb lymph nodes: Secondary | ICD-10-CM | POA: Diagnosis present

## 2024-03-23 DIAGNOSIS — C50911 Malignant neoplasm of unspecified site of right female breast: Secondary | ICD-10-CM | POA: Insufficient documentation

## 2024-03-23 DIAGNOSIS — G8929 Other chronic pain: Secondary | ICD-10-CM | POA: Diagnosis not present

## 2024-03-23 DIAGNOSIS — M1612 Unilateral primary osteoarthritis, left hip: Secondary | ICD-10-CM | POA: Diagnosis not present

## 2024-03-24 ENCOUNTER — Other Ambulatory Visit: Payer: Self-pay

## 2024-03-24 ENCOUNTER — Ambulatory Visit
Admission: RE | Admit: 2024-03-24 | Discharge: 2024-03-24 | Attending: Radiation Oncology | Admitting: Radiation Oncology

## 2024-03-24 ENCOUNTER — Ambulatory Visit
Admission: RE | Admit: 2024-03-24 | Discharge: 2024-03-24 | Disposition: A | Source: Ambulatory Visit | Attending: Radiation Oncology | Admitting: Radiation Oncology

## 2024-03-24 DIAGNOSIS — C50911 Malignant neoplasm of unspecified site of right female breast: Secondary | ICD-10-CM

## 2024-03-24 LAB — RAD ONC ARIA SESSION SUMMARY

## 2024-03-24 MED ORDER — ALRA NON-METALLIC DEODORANT (RAD-ONC)
1.0000 | Freq: Once | TOPICAL | Status: AC
  Administered 2024-03-24: 1 via TOPICAL

## 2024-03-24 MED ORDER — RADIAPLEXRX EX GEL: Freq: Once | CUTANEOUS | Status: AC

## 2024-03-25 ENCOUNTER — Ambulatory Visit
Admission: RE | Admit: 2024-03-25 | Discharge: 2024-03-25 | Disposition: A | Discharge status: Home / Self Care | Source: Ambulatory Visit | Attending: Radiation Oncology | Admitting: Radiation Oncology

## 2024-03-25 ENCOUNTER — Other Ambulatory Visit: Payer: Self-pay

## 2024-03-25 DIAGNOSIS — C50911 Malignant neoplasm of unspecified site of right female breast: Secondary | ICD-10-CM | POA: Diagnosis not present

## 2024-03-25 LAB — RAD ONC ARIA SESSION SUMMARY

## 2024-03-26 ENCOUNTER — Other Ambulatory Visit: Payer: Self-pay

## 2024-03-26 ENCOUNTER — Ambulatory Visit
Admission: RE | Admit: 2024-03-26 | Discharge: 2024-03-26 | Disposition: A | Discharge status: Home / Self Care | Source: Ambulatory Visit | Attending: Radiation Oncology | Admitting: Radiation Oncology

## 2024-03-26 DIAGNOSIS — C50911 Malignant neoplasm of unspecified site of right female breast: Secondary | ICD-10-CM | POA: Diagnosis not present

## 2024-03-26 LAB — RAD ONC ARIA SESSION SUMMARY

## 2024-03-27 ENCOUNTER — Other Ambulatory Visit: Payer: Self-pay

## 2024-03-27 ENCOUNTER — Ambulatory Visit
Admission: RE | Admit: 2024-03-27 | Discharge: 2024-03-27 | Disposition: A | Source: Ambulatory Visit | Attending: Radiation Oncology | Admitting: Radiation Oncology

## 2024-03-27 DIAGNOSIS — C50911 Malignant neoplasm of unspecified site of right female breast: Secondary | ICD-10-CM | POA: Diagnosis not present

## 2024-03-27 LAB — RAD ONC ARIA SESSION SUMMARY

## 2024-03-30 ENCOUNTER — Ambulatory Visit
Admission: RE | Admit: 2024-03-30 | Discharge: 2024-03-30 | Disposition: A | Source: Ambulatory Visit | Attending: Radiation Oncology | Admitting: Radiation Oncology

## 2024-03-30 ENCOUNTER — Other Ambulatory Visit: Payer: Self-pay

## 2024-03-30 ENCOUNTER — Other Ambulatory Visit: Payer: Self-pay | Admitting: Family Medicine

## 2024-03-30 DIAGNOSIS — G8929 Other chronic pain: Secondary | ICD-10-CM

## 2024-03-30 DIAGNOSIS — C50911 Malignant neoplasm of unspecified site of right female breast: Secondary | ICD-10-CM | POA: Diagnosis not present

## 2024-03-30 LAB — RAD ONC ARIA SESSION SUMMARY

## 2024-03-31 ENCOUNTER — Ambulatory Visit

## 2024-04-01 ENCOUNTER — Other Ambulatory Visit: Payer: Self-pay

## 2024-04-01 ENCOUNTER — Ambulatory Visit
Admission: RE | Admit: 2024-04-01 | Discharge: 2024-04-01 | Attending: Radiation Oncology | Admitting: Radiation Oncology

## 2024-04-01 DIAGNOSIS — C50911 Malignant neoplasm of unspecified site of right female breast: Secondary | ICD-10-CM | POA: Diagnosis not present

## 2024-04-01 LAB — RAD ONC ARIA SESSION SUMMARY

## 2024-04-02 ENCOUNTER — Other Ambulatory Visit: Payer: Self-pay

## 2024-04-02 ENCOUNTER — Ambulatory Visit: Admission: RE | Admit: 2024-04-02

## 2024-04-02 ENCOUNTER — Inpatient Hospital Stay

## 2024-04-02 ENCOUNTER — Inpatient Hospital Stay: Admitting: Hematology & Oncology

## 2024-04-02 ENCOUNTER — Encounter: Payer: Self-pay | Admitting: *Deleted

## 2024-04-02 VITALS — BP 153/82 | HR 64 | Temp 98.7°F | Resp 18 | Ht 67.0 in | Wt 168.0 lb

## 2024-04-02 DIAGNOSIS — Z170 Estrogen receptor positive status [ER+]: Secondary | ICD-10-CM | POA: Insufficient documentation

## 2024-04-02 DIAGNOSIS — C773 Secondary and unspecified malignant neoplasm of axilla and upper limb lymph nodes: Secondary | ICD-10-CM | POA: Diagnosis not present

## 2024-04-02 DIAGNOSIS — Z1732 Human epidermal growth factor receptor 2 negative status: Secondary | ICD-10-CM | POA: Insufficient documentation

## 2024-04-02 DIAGNOSIS — C50911 Malignant neoplasm of unspecified site of right female breast: Secondary | ICD-10-CM

## 2024-04-02 LAB — CMP (CANCER CENTER ONLY)
ALT: 13 U/L (ref 0–44)
AST: 21 U/L (ref 15–41)
Albumin: 4.3 g/dL (ref 3.5–5.0)
Alkaline Phosphatase: 55 U/L (ref 38–126)
Anion gap: 11 (ref 5–15)
BUN: 14 mg/dL (ref 8–23)
CO2: 27 mmol/L (ref 22–32)
Calcium: 9.9 mg/dL (ref 8.9–10.3)
Chloride: 104 mmol/L (ref 98–111)
Creatinine: 0.79 mg/dL (ref 0.44–1.00)
GFR, Estimated: >60 mL/min (ref >60)
Glucose, Bld: 99 mg/dL (ref 70–99)
Potassium: 4.4 mmol/L (ref 3.5–5.1)
Sodium: 141 mmol/L (ref 135–145)
Total Bilirubin: 0.8 mg/dL (ref 0.0–1.2)
Total Protein: 6.9 g/dL (ref 6.5–8.1)

## 2024-04-02 LAB — RAD ONC ARIA SESSION SUMMARY
Course Elapsed Days: 9
Plan Fractions Treated to Date: 7
Plan Fractions Treated to Date: 7
Plan Prescribed Dose Per Fraction: 1.8 Gy
Plan Prescribed Dose Per Fraction: 1.8 Gy
Plan Total Fractions Prescribed: 28
Plan Total Fractions Prescribed: 28
Plan Total Prescribed Dose: 50.4 Gy
Plan Total Prescribed Dose: 50.4 Gy
Reference Point Dosage Given to Date: 12.6 Gy
Reference Point Dosage Given to Date: 12.6 Gy
Reference Point Session Dosage Given: 1.8 Gy
Reference Point Session Dosage Given: 1.8 Gy
Session Number: 7

## 2024-04-02 LAB — CBC WITH DIFFERENTIAL (CANCER CENTER ONLY)
Abs Immature Granulocytes: 0.01 K/uL (ref 0.00–0.07)
Basophils Absolute: 0.1 K/uL (ref 0.0–0.1)
Basophils Relative: 1 %
Eosinophils Absolute: 0.1 K/uL (ref 0.0–0.5)
Eosinophils Relative: 3 %
HCT: 41.6 % (ref 36.0–46.0)
Hemoglobin: 14 g/dL (ref 12.0–15.0)
Immature Granulocytes: 0 %
Lymphocytes Relative: 31 %
Lymphs Abs: 1.2 K/uL (ref 0.7–4.0)
MCH: 32.1 pg (ref 26.0–34.0)
MCHC: 33.7 g/dL (ref 30.0–36.0)
MCV: 95.4 fL (ref 80.0–100.0)
Monocytes Absolute: 0.4 K/uL (ref 0.1–1.0)
Monocytes Relative: 10 %
Neutro Abs: 2.1 K/uL (ref 1.7–7.7)
Neutrophils Relative %: 55 %
Platelet Count: 176 K/uL (ref 150–400)
RBC: 4.36 MIL/uL (ref 3.87–5.11)
RDW: 11.9 % (ref 11.5–15.5)
WBC Count: 3.8 K/uL — ABNORMAL LOW (ref 4.0–10.5)
nRBC: 0 % (ref 0.0–0.2)

## 2024-04-02 NOTE — Progress Notes (Unsigned)
 Patient is currently receiving radiation. She is early in her therapy but doing well. Will continue with rxt and then move onto systemic therapy once complete.   Oncology Nurse Navigator Documentation     04/02/2024   10:30 AM  Oncology Nurse Navigator Flowsheets  Planned Course of Treatment Radiation  Phase of Treatment Radiation  Radiation Actual Start Date: 03/24/2024  Radiation Expected End Date: 05/13/2024  Navigator Follow Up Date: 04/30/2024  Navigator Follow Up Reason: Follow-up Appointment  Navigator Location [facility]  Navigator Encounter Type Follow-up Appt  Patient Visit Type MedOnc  Treatment Phase Active Tx  Barriers/Navigation Needs No Barriers At This Time  Acuity Level 1-No Barriers  Support Groups/Services Friends and Family  Time Spent with Patient 15

## 2024-04-02 NOTE — Progress Notes (Signed)
 Hematology and Oncology Follow Up Visit  [name] [id number] 01/17/1951 73 y.o. 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 twice daily-*on 02/20/2024   Interim History:  [name] is here today for follow-up.  She is to started radiation therapy.  I think she has had 6 treatments to date.  I am little surprised that it is taken this long to give her radiation.  However, I understand how busy Radiation Oncology is.  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  When we saw her back in November, she had some swelling in the right leg.  We did go ahead and get a Doppler of that leg.  She did have thromboembolic disease.  The thrombus extended from the mid femoral down to the peroneal vein.  She is on Eliquis  right now.  Her problem that she has is at her orthopedist is not going to give her meloxicam  while she is on Eliquis .  From my point of view, I do not have a problem with her being on meloxicam .  This really does help with her bursitis in the hip.  I told her that she must take the meloxicam  with food in the morning.  She has had no problems with bleeding.  There is been no problems with bowels or bladder.  She has had no cough.  There has been no cough.  She has had no chest wall pain.  Overall, I would have to say that her performance status is probably ECOG 1.    Medications:  Allergies as of 04/02/2024       Reactions   Sulfa Antibiotics Rash        Medication List        Accurate as of April 02, 2024 11:10 AM. If you have any questions, ask your nurse or doctor.           apixaban  5 MG Tabs tablet Commonly known as: ELIQUIS  Take 1 tablet (5 mg total) by mouth 2 (two) times daily.   Calcium Carbonate-Vitamin D 600-200 MG-UNIT Tabs Take by mouth daily.   cholecalciferol 25 MCG (1000 UNIT) tablet Commonly known as: VITAMIN D3 Take 1,000 Units by mouth daily.   citalopram  10 MG tablet Commonly known as: CELEXA  Take 1 tablet by mouth once daily   COLLAGEN PO Take by mouth daily.   cyanocobalamin 1000 MCG tablet Commonly known as: VITAMIN B12 Take by mouth daily.   loratadine 10 MG tablet Commonly known as: CLARITIN Take 10 mg by mouth daily.   losartan  50 MG tablet Commonly known as: COZAAR  Take 1 tablet by mouth once daily   meloxicam  7.5 MG tablet Commonly known as: MOBIC  TAKE 1 TABLET BY MOUTH ONCE DAILY AS NEEDED FOR PAIN   multivitamin with minerals tablet Take by mouth.   TURMERIC PO Take by mouth daily.        Allergies:  Allergies  Allergen Reactions   Sulfa Antibiotics Rash    Past Medical History, Surgical history, Social history, and Family History were reviewed and updated.  Review of Systems:  rReview of Systems  Constitutional: Negative.   HENT:  Negative.    Eyes: Negative.   Respiratory: Negative.    Cardiovascular: Negative.   Gastrointestinal: Negative.   Genitourinary: Negative.   Musculoskeletal: Negative.   Skin: Negative.   Neurological: Negative.   Endo/Heme/Allergies: Negative.   Psychiatric/Behavioral: Negative.       Physical Exam:  Vital signs show temperature of 98.7.  Pulse 64.  Blood pressure 153/82.  Weight is 168 pounds.  Wt Readings from Last 3 Encounters:  02/26/24 175 lb 3.2 oz (79.5 kg)  02/19/24 171 lb 12.8 oz (77.9 kg)  02/06/24 174 lb 13.2 oz (79.3 kg)    Physical Exam Vitals reviewed.  Constitutional:      Comments: Her breast exam shows left breast with no masses, edema or erythema.  There is no left axillary adenopathy.  Right breast shows a  well-healed lumpectomy at the areola.  This is well-healed.  There is no masses.  There is no erythema.  She has a right axillary lymphadenectomy scar.  This is also healed.    HENT:     Head: Normocephalic and atraumatic.  Eyes:     Pupils: Pupils are equal, round, and reactive to light.  Cardiovascular:     Rate and Rhythm: Normal rate and regular rhythm.     Heart sounds: Normal heart sounds.  Pulmonary:     Effort: Pulmonary effort is normal.     Breath sounds: Normal breath sounds.  Abdominal:     General: Bowel sounds are normal.     Palpations: Abdomen is soft.  Musculoskeletal:        General: No tenderness or deformity. Normal range of motion.     Cervical back: Normal range of motion.     Comments: Her legs show some slight swelling in the right leg.  I do not palpate a venous cord.  She has good range of motion.  She has a negative Homans' sign.     Lymphadenopathy:     Cervical: No cervical adenopathy.  Skin:    General: Skin is warm and dry.     Findings: No erythema or rash.  Neurological:     Mental Status: She is alert and oriented to person, place, and time.  Psychiatric:        Behavior: Behavior normal.        Thought Content: Thought content normal.        Judgment: Judgment normal.      Lab Results  Component Value Date   WBC 3.8 (L) 04/02/2024   HGB 14.0 04/02/2024   HCT 41.6 04/02/2024   MCV 95.4 04/02/2024   PLT 176 04/02/2024   Lab Results  Component Value Date   FERRITIN 573 (H) 11/13/2023   IRON 122 11/13/2023   TIBC 337 11/13/2023   UIBC 215 11/13/2023   IRONPCTSAT 36 (H) 11/13/2023   Lab Results  Component Value Date   RBC 4.36 04/02/2024   No results found for: KPAFRELGTCHN, LAMBDASER, KAPLAMBRATIO No results found for: IGGSERUM, IGA, IGMSERUM No results found for: TOTALPROTELP, ALBUMINELP, A1GS, A2GS, BETS, BETA2SER, GAMS, MSPIKE, SPEI   Chemistry      Component Value Date/Time   NA 141 04/02/2024  1000   NA 142 02/02/2015 0000   K 4.4 04/02/2024 1000   CL 104 04/02/2024 1000   CO2 27 04/02/2024 1000   BUN 14 04/02/2024 1000   BUN 13 02/02/2015 0000   CREATININE 0.79 04/02/2024 1000   GLU 101 02/02/2015 0000      Component Value Date/Time  CALCIUM 9.9 04/02/2024 1000   ALKPHOS 55 04/02/2024 1000   AST 21 04/02/2024 1000   ALT 13 04/02/2024 1000   BILITOT 0.8 04/02/2024 1000       Impression and Plan: Ms. Khurana is a very pleasant 73 yo caucasian postmenopausal female with locally advanced infiltrating ductal carcinoma of the right breast- ER+/PR+/HER2- --- Oncotype =30.  She received neoadjuvant chemotherapy.  She then underwent lumpectomy.  She was found to have a stage IIA ductal carcinoma.  She had 2 positive lymph nodes.  She is undergoing radiation therapy right now.  I think that when she finishes radiation therapy, she would be a good candidate for aromatase inhibitor therapy along with CDK4/CDK6 therapy.  I will consider her at high risk early stage.  She will continue the Eliquis .  Again, I would probably follow-up with a another Doppler upon about 2 months or so.  Again I told her that she is going need to be on long-term anticoagulation because she has had thromboembolic disease in the past.  I will see her back in about 1 month.  Hopefully, by then, she we will be close to finishing up radiotherapy.   Maude JONELLE Crease, MD 12/18/202511:10 AM

## 2024-04-03 ENCOUNTER — Encounter: Payer: Self-pay | Admitting: Hematology & Oncology

## 2024-04-03 ENCOUNTER — Ambulatory Visit

## 2024-04-03 ENCOUNTER — Other Ambulatory Visit: Payer: Self-pay

## 2024-04-03 DIAGNOSIS — C50911 Malignant neoplasm of unspecified site of right female breast: Secondary | ICD-10-CM | POA: Diagnosis not present

## 2024-04-03 LAB — RAD ONC ARIA SESSION SUMMARY
Course Elapsed Days: 10
Plan Fractions Treated to Date: 8
Plan Fractions Treated to Date: 8
Plan Prescribed Dose Per Fraction: 1.8 Gy
Plan Prescribed Dose Per Fraction: 1.8 Gy
Plan Total Fractions Prescribed: 28
Plan Total Fractions Prescribed: 28
Plan Total Prescribed Dose: 50.4 Gy
Plan Total Prescribed Dose: 50.4 Gy
Reference Point Dosage Given to Date: 14.4 Gy
Reference Point Dosage Given to Date: 14.4 Gy
Reference Point Session Dosage Given: 1.8 Gy
Reference Point Session Dosage Given: 1.8 Gy
Session Number: 8

## 2024-04-05 ENCOUNTER — Ambulatory Visit
Admission: RE | Admit: 2024-04-05 | Discharge: 2024-04-05 | Attending: Radiation Oncology | Admitting: Radiation Oncology

## 2024-04-05 ENCOUNTER — Other Ambulatory Visit: Payer: Self-pay

## 2024-04-05 DIAGNOSIS — C50911 Malignant neoplasm of unspecified site of right female breast: Secondary | ICD-10-CM | POA: Diagnosis not present

## 2024-04-05 LAB — RAD ONC ARIA SESSION SUMMARY
Course Elapsed Days: 12
Plan Fractions Treated to Date: 9
Plan Fractions Treated to Date: 9
Plan Prescribed Dose Per Fraction: 1.8 Gy
Plan Prescribed Dose Per Fraction: 1.8 Gy
Plan Total Fractions Prescribed: 28
Plan Total Fractions Prescribed: 28
Plan Total Prescribed Dose: 50.4 Gy
Plan Total Prescribed Dose: 50.4 Gy
Reference Point Dosage Given to Date: 16.2 Gy
Reference Point Dosage Given to Date: 16.2 Gy
Reference Point Session Dosage Given: 1.8 Gy
Reference Point Session Dosage Given: 1.8 Gy
Session Number: 9

## 2024-04-06 ENCOUNTER — Ambulatory Visit
Admission: RE | Admit: 2024-04-06 | Discharge: 2024-04-06 | Attending: Radiation Oncology | Admitting: Radiation Oncology

## 2024-04-06 ENCOUNTER — Other Ambulatory Visit: Payer: Self-pay

## 2024-04-06 DIAGNOSIS — C50911 Malignant neoplasm of unspecified site of right female breast: Secondary | ICD-10-CM | POA: Diagnosis not present

## 2024-04-06 LAB — RAD ONC ARIA SESSION SUMMARY

## 2024-04-07 ENCOUNTER — Ambulatory Visit
Admission: RE | Admit: 2024-04-07 | Discharge: 2024-04-07 | Disposition: A | Discharge status: Home / Self Care | Source: Ambulatory Visit | Attending: Radiation Oncology | Admitting: Radiation Oncology

## 2024-04-07 ENCOUNTER — Other Ambulatory Visit: Payer: Self-pay

## 2024-04-07 DIAGNOSIS — C50911 Malignant neoplasm of unspecified site of right female breast: Secondary | ICD-10-CM | POA: Diagnosis not present

## 2024-04-07 LAB — RAD ONC ARIA SESSION SUMMARY

## 2024-04-08 ENCOUNTER — Other Ambulatory Visit: Payer: Self-pay

## 2024-04-08 ENCOUNTER — Ambulatory Visit
Admission: RE | Admit: 2024-04-08 | Discharge: 2024-04-08 | Disposition: A | Source: Ambulatory Visit | Attending: Radiation Oncology | Admitting: Radiation Oncology

## 2024-04-08 DIAGNOSIS — C50911 Malignant neoplasm of unspecified site of right female breast: Secondary | ICD-10-CM | POA: Diagnosis not present

## 2024-04-08 LAB — RAD ONC ARIA SESSION SUMMARY

## 2024-04-12 ENCOUNTER — Ambulatory Visit

## 2024-04-13 ENCOUNTER — Ambulatory Visit
Admission: RE | Admit: 2024-04-13 | Discharge: 2024-04-13 | Disposition: A | Discharge status: Home / Self Care | Source: Ambulatory Visit | Attending: Radiation Oncology | Admitting: Radiation Oncology

## 2024-04-13 ENCOUNTER — Other Ambulatory Visit: Payer: Self-pay

## 2024-04-13 ENCOUNTER — Encounter: Payer: Self-pay | Admitting: *Deleted

## 2024-04-13 DIAGNOSIS — C50911 Malignant neoplasm of unspecified site of right female breast: Secondary | ICD-10-CM | POA: Diagnosis not present

## 2024-04-13 LAB — RAD ONC ARIA SESSION SUMMARY
Course Elapsed Days: 20
Plan Fractions Treated to Date: 13
Plan Fractions Treated to Date: 13
Plan Prescribed Dose Per Fraction: 1.8 Gy
Plan Prescribed Dose Per Fraction: 1.8 Gy
Plan Total Fractions Prescribed: 28
Plan Total Fractions Prescribed: 28
Plan Total Prescribed Dose: 50.4 Gy
Plan Total Prescribed Dose: 50.4 Gy
Reference Point Dosage Given to Date: 23.4 Gy
Reference Point Dosage Given to Date: 23.4 Gy
Reference Point Session Dosage Given: 1.8 Gy
Reference Point Session Dosage Given: 1.8 Gy
Session Number: 13

## 2024-04-14 ENCOUNTER — Ambulatory Visit
Admission: RE | Admit: 2024-04-14 | Discharge: 2024-04-14 | Disposition: A | Discharge status: Home / Self Care | Source: Ambulatory Visit | Attending: Radiation Oncology | Admitting: Radiation Oncology

## 2024-04-14 ENCOUNTER — Other Ambulatory Visit: Payer: Self-pay

## 2024-04-14 DIAGNOSIS — C50911 Malignant neoplasm of unspecified site of right female breast: Secondary | ICD-10-CM | POA: Diagnosis not present

## 2024-04-14 LAB — RAD ONC ARIA SESSION SUMMARY
Course Elapsed Days: 21
Plan Fractions Treated to Date: 14
Plan Fractions Treated to Date: 14
Plan Prescribed Dose Per Fraction: 1.8 Gy
Plan Prescribed Dose Per Fraction: 1.8 Gy
Plan Total Fractions Prescribed: 28
Plan Total Fractions Prescribed: 28
Plan Total Prescribed Dose: 50.4 Gy
Plan Total Prescribed Dose: 50.4 Gy
Reference Point Dosage Given to Date: 25.2 Gy
Reference Point Dosage Given to Date: 25.2 Gy
Reference Point Session Dosage Given: 1.8 Gy
Reference Point Session Dosage Given: 1.8 Gy
Session Number: 14

## 2024-04-15 ENCOUNTER — Other Ambulatory Visit: Payer: Self-pay

## 2024-04-15 ENCOUNTER — Ambulatory Visit
Admission: RE | Admit: 2024-04-15 | Discharge: 2024-04-15 | Disposition: A | Discharge status: Home / Self Care | Source: Ambulatory Visit | Attending: Radiation Oncology | Admitting: Radiation Oncology

## 2024-04-15 DIAGNOSIS — C50911 Malignant neoplasm of unspecified site of right female breast: Secondary | ICD-10-CM | POA: Diagnosis not present

## 2024-04-15 LAB — RAD ONC ARIA SESSION SUMMARY
Course Elapsed Days: 22
Plan Fractions Treated to Date: 15
Plan Fractions Treated to Date: 15
Plan Prescribed Dose Per Fraction: 1.8 Gy
Plan Prescribed Dose Per Fraction: 1.8 Gy
Plan Total Fractions Prescribed: 28
Plan Total Fractions Prescribed: 28
Plan Total Prescribed Dose: 50.4 Gy
Plan Total Prescribed Dose: 50.4 Gy
Reference Point Dosage Given to Date: 27 Gy
Reference Point Dosage Given to Date: 27 Gy
Reference Point Session Dosage Given: 1.8 Gy
Reference Point Session Dosage Given: 1.8 Gy
Session Number: 15

## 2024-04-16 ENCOUNTER — Encounter: Payer: Self-pay | Admitting: Hematology & Oncology

## 2024-04-17 ENCOUNTER — Ambulatory Visit
Admission: RE | Admit: 2024-04-17 | Discharge: 2024-04-17 | Disposition: A | Discharge status: Home / Self Care | Source: Ambulatory Visit | Attending: Radiation Oncology | Admitting: Radiation Oncology

## 2024-04-17 ENCOUNTER — Encounter: Payer: Self-pay | Admitting: Hematology & Oncology

## 2024-04-17 ENCOUNTER — Other Ambulatory Visit: Payer: Self-pay

## 2024-04-17 DIAGNOSIS — C773 Secondary and unspecified malignant neoplasm of axilla and upper limb lymph nodes: Secondary | ICD-10-CM | POA: Insufficient documentation

## 2024-04-17 DIAGNOSIS — C50911 Malignant neoplasm of unspecified site of right female breast: Secondary | ICD-10-CM | POA: Insufficient documentation

## 2024-04-17 LAB — RAD ONC ARIA SESSION SUMMARY
Course Elapsed Days: 24
Plan Fractions Treated to Date: 16
Plan Fractions Treated to Date: 16
Plan Prescribed Dose Per Fraction: 1.8 Gy
Plan Prescribed Dose Per Fraction: 1.8 Gy
Plan Total Fractions Prescribed: 28
Plan Total Fractions Prescribed: 28
Plan Total Prescribed Dose: 50.4 Gy
Plan Total Prescribed Dose: 50.4 Gy
Reference Point Dosage Given to Date: 28.8 Gy
Reference Point Dosage Given to Date: 28.8 Gy
Reference Point Session Dosage Given: 1.8 Gy
Reference Point Session Dosage Given: 1.8 Gy
Session Number: 16

## 2024-04-19 ENCOUNTER — Other Ambulatory Visit: Payer: Self-pay | Admitting: Family Medicine

## 2024-04-19 DIAGNOSIS — I10 Essential (primary) hypertension: Secondary | ICD-10-CM

## 2024-04-20 ENCOUNTER — Other Ambulatory Visit: Payer: Self-pay

## 2024-04-20 ENCOUNTER — Ambulatory Visit

## 2024-04-20 LAB — RAD ONC ARIA SESSION SUMMARY
Course Elapsed Days: 27
Plan Fractions Treated to Date: 17
Plan Fractions Treated to Date: 17
Plan Prescribed Dose Per Fraction: 1.8 Gy
Plan Prescribed Dose Per Fraction: 1.8 Gy
Plan Total Fractions Prescribed: 28
Plan Total Fractions Prescribed: 28
Plan Total Prescribed Dose: 50.4 Gy
Plan Total Prescribed Dose: 50.4 Gy
Reference Point Dosage Given to Date: 30.6 Gy
Reference Point Dosage Given to Date: 30.6 Gy
Reference Point Session Dosage Given: 1.8 Gy
Reference Point Session Dosage Given: 1.8 Gy
Session Number: 17

## 2024-04-21 ENCOUNTER — Ambulatory Visit
Admission: RE | Admit: 2024-04-21 | Discharge: 2024-04-21 | Attending: Radiation Oncology | Admitting: Radiation Oncology

## 2024-04-21 ENCOUNTER — Other Ambulatory Visit: Payer: Self-pay

## 2024-04-21 LAB — RAD ONC ARIA SESSION SUMMARY
Course Elapsed Days: 28
Plan Fractions Treated to Date: 18
Plan Fractions Treated to Date: 18
Plan Prescribed Dose Per Fraction: 1.8 Gy
Plan Prescribed Dose Per Fraction: 1.8 Gy
Plan Total Fractions Prescribed: 28
Plan Total Fractions Prescribed: 28
Plan Total Prescribed Dose: 50.4 Gy
Plan Total Prescribed Dose: 50.4 Gy
Reference Point Dosage Given to Date: 32.4 Gy
Reference Point Dosage Given to Date: 32.4 Gy
Reference Point Session Dosage Given: 1.8 Gy
Reference Point Session Dosage Given: 1.8 Gy
Session Number: 18

## 2024-04-22 ENCOUNTER — Ambulatory Visit
Admission: RE | Admit: 2024-04-22 | Discharge: 2024-04-22 | Disposition: A | Discharge status: Home / Self Care | Source: Ambulatory Visit | Attending: Radiation Oncology | Admitting: Radiation Oncology

## 2024-04-22 ENCOUNTER — Other Ambulatory Visit: Payer: Self-pay

## 2024-04-22 LAB — RAD ONC ARIA SESSION SUMMARY
Course Elapsed Days: 29
Plan Fractions Treated to Date: 19
Plan Fractions Treated to Date: 19
Plan Prescribed Dose Per Fraction: 1.8 Gy
Plan Prescribed Dose Per Fraction: 1.8 Gy
Plan Total Fractions Prescribed: 28
Plan Total Fractions Prescribed: 28
Plan Total Prescribed Dose: 50.4 Gy
Plan Total Prescribed Dose: 50.4 Gy
Reference Point Dosage Given to Date: 34.2 Gy
Reference Point Dosage Given to Date: 34.2 Gy
Reference Point Session Dosage Given: 1.8 Gy
Reference Point Session Dosage Given: 1.8 Gy
Session Number: 19

## 2024-04-23 ENCOUNTER — Ambulatory Visit
Admission: RE | Admit: 2024-04-23 | Discharge: 2024-04-23 | Disposition: A | Discharge status: Home / Self Care | Source: Ambulatory Visit | Attending: Radiation Oncology | Admitting: Radiation Oncology

## 2024-04-23 ENCOUNTER — Other Ambulatory Visit: Payer: Self-pay

## 2024-04-23 LAB — RAD ONC ARIA SESSION SUMMARY
Course Elapsed Days: 30
Plan Fractions Treated to Date: 20
Plan Fractions Treated to Date: 20
Plan Prescribed Dose Per Fraction: 1.8 Gy
Plan Prescribed Dose Per Fraction: 1.8 Gy
Plan Total Fractions Prescribed: 28
Plan Total Fractions Prescribed: 28
Plan Total Prescribed Dose: 50.4 Gy
Plan Total Prescribed Dose: 50.4 Gy
Reference Point Dosage Given to Date: 36 Gy
Reference Point Dosage Given to Date: 36 Gy
Reference Point Session Dosage Given: 1.8 Gy
Reference Point Session Dosage Given: 1.8 Gy
Session Number: 20

## 2024-04-24 ENCOUNTER — Other Ambulatory Visit: Payer: Self-pay

## 2024-04-24 ENCOUNTER — Ambulatory Visit
Admission: RE | Admit: 2024-04-24 | Discharge: 2024-04-24 | Disposition: A | Source: Ambulatory Visit | Attending: Radiation Oncology | Admitting: Radiation Oncology

## 2024-04-24 LAB — RAD ONC ARIA SESSION SUMMARY
Course Elapsed Days: 31
Plan Fractions Treated to Date: 21
Plan Fractions Treated to Date: 21
Plan Prescribed Dose Per Fraction: 1.8 Gy
Plan Prescribed Dose Per Fraction: 1.8 Gy
Plan Total Fractions Prescribed: 28
Plan Total Fractions Prescribed: 28
Plan Total Prescribed Dose: 50.4 Gy
Plan Total Prescribed Dose: 50.4 Gy
Reference Point Dosage Given to Date: 37.8 Gy
Reference Point Dosage Given to Date: 37.8 Gy
Reference Point Session Dosage Given: 1.8 Gy
Reference Point Session Dosage Given: 1.8 Gy
Session Number: 21

## 2024-04-26 ENCOUNTER — Other Ambulatory Visit: Payer: Self-pay | Admitting: Family Medicine

## 2024-04-26 DIAGNOSIS — G8929 Other chronic pain: Secondary | ICD-10-CM

## 2024-04-27 ENCOUNTER — Other Ambulatory Visit: Payer: Self-pay

## 2024-04-27 ENCOUNTER — Ambulatory Visit
Admission: RE | Admit: 2024-04-27 | Discharge: 2024-04-27 | Disposition: A | Source: Ambulatory Visit | Attending: Radiation Oncology | Admitting: Radiation Oncology

## 2024-04-27 LAB — RAD ONC ARIA SESSION SUMMARY
Course Elapsed Days: 34
Plan Fractions Treated to Date: 22
Plan Fractions Treated to Date: 22
Plan Prescribed Dose Per Fraction: 1.8 Gy
Plan Prescribed Dose Per Fraction: 1.8 Gy
Plan Total Fractions Prescribed: 28
Plan Total Fractions Prescribed: 28
Plan Total Prescribed Dose: 50.4 Gy
Plan Total Prescribed Dose: 50.4 Gy
Reference Point Dosage Given to Date: 39.6 Gy
Reference Point Dosage Given to Date: 39.6 Gy
Reference Point Session Dosage Given: 1.8 Gy
Reference Point Session Dosage Given: 1.8 Gy
Session Number: 22

## 2024-04-28 ENCOUNTER — Ambulatory Visit: Admitting: Radiation Oncology

## 2024-04-28 ENCOUNTER — Ambulatory Visit
Admission: RE | Admit: 2024-04-28 | Discharge: 2024-04-28 | Disposition: A | Source: Ambulatory Visit | Attending: Radiation Oncology | Admitting: Radiation Oncology

## 2024-04-28 ENCOUNTER — Other Ambulatory Visit: Payer: Self-pay

## 2024-04-28 LAB — RAD ONC ARIA SESSION SUMMARY
Course Elapsed Days: 35
Plan Fractions Treated to Date: 23
Plan Fractions Treated to Date: 23
Plan Prescribed Dose Per Fraction: 1.8 Gy
Plan Prescribed Dose Per Fraction: 1.8 Gy
Plan Total Fractions Prescribed: 28
Plan Total Fractions Prescribed: 28
Plan Total Prescribed Dose: 50.4 Gy
Plan Total Prescribed Dose: 50.4 Gy
Reference Point Dosage Given to Date: 41.4 Gy
Reference Point Dosage Given to Date: 41.4 Gy
Reference Point Session Dosage Given: 1.8 Gy
Reference Point Session Dosage Given: 1.8 Gy
Session Number: 23

## 2024-04-29 ENCOUNTER — Ambulatory Visit
Admission: RE | Admit: 2024-04-29 | Discharge: 2024-04-29 | Attending: Radiation Oncology | Admitting: Radiation Oncology

## 2024-04-29 ENCOUNTER — Other Ambulatory Visit: Payer: Self-pay

## 2024-04-29 LAB — RAD ONC ARIA SESSION SUMMARY
Course Elapsed Days: 36
Plan Fractions Treated to Date: 24
Plan Fractions Treated to Date: 24
Plan Prescribed Dose Per Fraction: 1.8 Gy
Plan Prescribed Dose Per Fraction: 1.8 Gy
Plan Total Fractions Prescribed: 28
Plan Total Fractions Prescribed: 28
Plan Total Prescribed Dose: 50.4 Gy
Plan Total Prescribed Dose: 50.4 Gy
Reference Point Dosage Given to Date: 43.2 Gy
Reference Point Dosage Given to Date: 43.2 Gy
Reference Point Session Dosage Given: 1.8 Gy
Reference Point Session Dosage Given: 1.8 Gy
Session Number: 24

## 2024-04-30 ENCOUNTER — Inpatient Hospital Stay: Admitting: Hematology & Oncology

## 2024-04-30 ENCOUNTER — Other Ambulatory Visit: Payer: Self-pay

## 2024-04-30 ENCOUNTER — Encounter: Payer: Self-pay | Admitting: *Deleted

## 2024-04-30 ENCOUNTER — Ambulatory Visit
Admission: RE | Admit: 2024-04-30 | Discharge: 2024-04-30 | Attending: Radiation Oncology | Admitting: Radiation Oncology

## 2024-04-30 ENCOUNTER — Encounter: Payer: Self-pay | Admitting: Hematology & Oncology

## 2024-04-30 ENCOUNTER — Inpatient Hospital Stay: Attending: Hematology & Oncology

## 2024-04-30 VITALS — BP 129/75 | HR 81 | Temp 98.9°F | Resp 18 | Ht 67.0 in | Wt 170.0 lb

## 2024-04-30 DIAGNOSIS — C773 Secondary and unspecified malignant neoplasm of axilla and upper limb lymph nodes: Secondary | ICD-10-CM

## 2024-04-30 DIAGNOSIS — I82401 Acute embolism and thrombosis of unspecified deep veins of right lower extremity: Secondary | ICD-10-CM | POA: Insufficient documentation

## 2024-04-30 DIAGNOSIS — Z17411 Hormone receptor positive with human epidermal growth factor receptor 2 negative status: Secondary | ICD-10-CM | POA: Insufficient documentation

## 2024-04-30 DIAGNOSIS — C50911 Malignant neoplasm of unspecified site of right female breast: Secondary | ICD-10-CM | POA: Insufficient documentation

## 2024-04-30 DIAGNOSIS — Z7901 Long term (current) use of anticoagulants: Secondary | ICD-10-CM | POA: Insufficient documentation

## 2024-04-30 LAB — RAD ONC ARIA SESSION SUMMARY
Course Elapsed Days: 37
Plan Fractions Treated to Date: 25
Plan Fractions Treated to Date: 25
Plan Prescribed Dose Per Fraction: 1.8 Gy
Plan Prescribed Dose Per Fraction: 1.8 Gy
Plan Total Fractions Prescribed: 28
Plan Total Fractions Prescribed: 28
Plan Total Prescribed Dose: 50.4 Gy
Plan Total Prescribed Dose: 50.4 Gy
Reference Point Dosage Given to Date: 45 Gy
Reference Point Dosage Given to Date: 45 Gy
Reference Point Session Dosage Given: 1.8 Gy
Reference Point Session Dosage Given: 1.8 Gy
Session Number: 25

## 2024-04-30 LAB — CBC WITH DIFFERENTIAL (CANCER CENTER ONLY)
Abs Immature Granulocytes: 0.01 K/uL (ref 0.00–0.07)
Basophils Absolute: 0 K/uL (ref 0.0–0.1)
Basophils Relative: 1 %
Eosinophils Absolute: 0.1 K/uL (ref 0.0–0.5)
Eosinophils Relative: 1 %
HCT: 41.8 % (ref 36.0–46.0)
Hemoglobin: 14 g/dL (ref 12.0–15.0)
Immature Granulocytes: 0 %
Lymphocytes Relative: 18 %
Lymphs Abs: 0.7 K/uL (ref 0.7–4.0)
MCH: 31.7 pg (ref 26.0–34.0)
MCHC: 33.5 g/dL (ref 30.0–36.0)
MCV: 94.8 fL (ref 80.0–100.0)
Monocytes Absolute: 0.3 K/uL (ref 0.1–1.0)
Monocytes Relative: 9 %
Neutro Abs: 2.5 K/uL (ref 1.7–7.7)
Neutrophils Relative %: 71 %
Platelet Count: 150 K/uL (ref 150–400)
RBC: 4.41 MIL/uL (ref 3.87–5.11)
RDW: 12.3 % (ref 11.5–15.5)
WBC Count: 3.6 K/uL — ABNORMAL LOW (ref 4.0–10.5)
nRBC: 0 % (ref 0.0–0.2)

## 2024-04-30 LAB — CMP (CANCER CENTER ONLY)
ALT: 16 U/L (ref 0–44)
AST: 22 U/L (ref 15–41)
Albumin: 4.6 g/dL (ref 3.5–5.0)
Alkaline Phosphatase: 53 U/L (ref 38–126)
Anion gap: 12 (ref 5–15)
BUN: 16 mg/dL (ref 8–23)
CO2: 26 mmol/L (ref 22–32)
Calcium: 9.9 mg/dL (ref 8.9–10.3)
Chloride: 104 mmol/L (ref 98–111)
Creatinine: 0.84 mg/dL (ref 0.44–1.00)
GFR, Estimated: >60 mL/min
Glucose, Bld: 102 mg/dL — ABNORMAL HIGH (ref 70–99)
Potassium: 4 mmol/L (ref 3.5–5.1)
Sodium: 142 mmol/L (ref 135–145)
Total Bilirubin: 0.9 mg/dL (ref 0.0–1.2)
Total Protein: 7.1 g/dL (ref 6.5–8.1)

## 2024-04-30 LAB — D-DIMER, QUANTITATIVE: D-Dimer, Quant: 0.43 ug{FEU}/mL (ref 0.00–0.50)

## 2024-04-30 NOTE — Progress Notes (Signed)
 Patient comes in today for follow up. She is currently receiving radiation therapy. She is having good tolerance with some skin irritation. She will follow up post radiation with planning for AI + CDK4/CDK6 therapy.  Oncology Nurse Navigator Documentation     04/30/2024    2:15 PM  Oncology Nurse Navigator Flowsheets  Navigator Follow Up Date: 06/01/2024  Navigator Follow Up Reason: Follow-up Appointment  Navigator Location [facility]  Navigator Encounter Type Appt/Treatment Plan Review  Patient Visit Type MedOnc  Treatment Phase Active Tx  Barriers/Navigation Needs No Barriers At This Time  Acuity Level 1-No Barriers  Support Groups/Services Friends and Family  Time Spent with Patient 15

## 2024-04-30 NOTE — Progress Notes (Signed)
 " Hematology and Oncology Follow Up Visit  [name] [id number] 1950/09/02 74 y.o. 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 twice daily-*on 02/20/2024   Interim History:  [name] is here today for follow-up.  She is still getting radiation therapy.  She has 10 treatments left.  She does have a lot of inflammation on the right breast/chest wall.  There is no skin breakdown.  She is using some cream to try to help prevent skin breakdown.  She does feel a little tired..  She has had no problems with nausea or vomiting.  There is been no change in bowel or bladder habits.  She is on Eliquis .  She is doing well on the Eliquis .  She still has a little bit of swelling in the right leg.  This is mostly now about the ankle.  She has had no fever.  There is been no headache.  She does feel little bit lightheaded on occasion.  Thankfully, her arthritis is not flared up.  She has not had to take any meloxicam .  I told her that she could certainly take meloxicam  with the Eliquis , just as long as she takes the meloxicam  with food.  Overall, I would have to say that her performance status is probably ECOG 1.    Medications:  Allergies as of 04/30/2024       Reactions   Sulfa Antibiotics Rash        Medication List        Accurate as of April 30, 2024 11:58 AM. If you have any questions, ask your nurse or doctor.          apixaban  5 MG Tabs tablet Commonly known as: ELIQUIS  Take 1 tablet (5 mg total) by mouth 2 (two) times daily.   Calcium Carbonate-Vitamin D 600-200 MG-UNIT Tabs Take by mouth daily.   cholecalciferol  25 MCG (1000 UNIT) tablet Commonly known as: VITAMIN D3 Take 1,000 Units by mouth daily.   citalopram  10 MG tablet Commonly known as: CELEXA  Take 1 tablet by mouth once daily   COLLAGEN PO Take by mouth daily.   cyanocobalamin 1000 MCG tablet Commonly known as: VITAMIN B12 Take by mouth daily.   loratadine 10 MG tablet Commonly known as: CLARITIN Take 10 mg by mouth daily.   losartan  50 MG tablet Commonly known as: COZAAR  Take 1 tablet by mouth once daily   meloxicam  7.5 MG tablet Commonly known as: MOBIC  TAKE 1 TABLET BY MOUTH ONCE DAILY AS NEEDED FOR PAIN   multivitamin with minerals tablet Take by mouth.   TURMERIC PO Take by mouth daily.        Allergies:  Allergies  Allergen Reactions   Sulfa Antibiotics Rash    Past Medical History, Surgical history, Social history, and Family History were reviewed and updated.  Review of Systems:  rReview of Systems  Constitutional: Negative.   HENT: Negative.    Eyes: Negative.   Respiratory: Negative.    Cardiovascular: Negative.   Gastrointestinal: Negative.   Genitourinary: Negative.   Musculoskeletal: Negative.   Skin: Negative.   Neurological: Negative.   Endo/Heme/Allergies: Negative.   Psychiatric/Behavioral: Negative.  Physical Exam:  Vital signs show temperature of 98.9.  Pulse 81.  Blood pressure 129/75.  Weight is 170 pounds.    Wt Readings from Last 3 Encounters:  04/02/24 168 lb (76.2 kg)  02/26/24 175 lb 3.2 oz (79.5 kg)  02/19/24 171 lb 12.8 oz (77.9 kg)    Physical Exam Vitals reviewed.  Constitutional:      Comments: Her breast exam shows left breast with no masses, edema or erythema.  There is no left axillary adenopathy.  Right breast shows a well-healed lumpectomy at the areola.  This is well-healed.  She has quite a bit of erythema on the right breast and right chest wall.  There is no skin breakdown.  I see no discharges.  There is inflammation and erythema in the right  axilla.  I cannot palpate any adenopathy in the right axilla.    HENT:     Head: Normocephalic and atraumatic.  Eyes:     Pupils: Pupils are equal, round, and reactive to light.  Cardiovascular:     Rate and Rhythm: Normal rate and regular rhythm.     Heart sounds: Normal heart sounds.  Pulmonary:     Effort: Pulmonary effort is normal.     Breath sounds: Normal breath sounds.  Abdominal:     General: Bowel sounds are normal.     Palpations: Abdomen is soft.  Musculoskeletal:        General: No tenderness or deformity. Normal range of motion.     Cervical back: Normal range of motion.     Comments: Her legs show some slight swelling in the right leg.  I do not palpate a venous cord.  She has good range of motion.  She has a negative Homans' sign.     Lymphadenopathy:     Cervical: No cervical adenopathy.  Skin:    General: Skin is warm and dry.     Findings: No erythema or rash.  Neurological:     Mental Status: She is alert and oriented to person, place, and time.  Psychiatric:        Behavior: Behavior normal.        Thought Content: Thought content normal.        Judgment: Judgment normal.      Lab Results  Component Value Date   WBC 3.8 (L) 04/02/2024   HGB 14.0 04/02/2024   HCT 41.6 04/02/2024   MCV 95.4 04/02/2024   PLT 176 04/02/2024   Lab Results  Component Value Date   FERRITIN 573 (H) 11/13/2023   IRON 122 11/13/2023   TIBC 337 11/13/2023   UIBC 215 11/13/2023   IRONPCTSAT 36 (H) 11/13/2023   Lab Results  Component Value Date   RBC 4.36 04/02/2024   No results found for: KPAFRELGTCHN, LAMBDASER, KAPLAMBRATIO No results found for: IGGSERUM, IGA, IGMSERUM No results found for: STEPHANY CARLOTA BENSON MARKEL EARLA JOANNIE DOC, MSPIKE, SPEI   Chemistry      Component Value Date/Time   NA 141 04/02/2024 1000   NA 142 02/02/2015 0000   K 4.4 04/02/2024 1000   CL 104 04/02/2024 1000   CO2 27 04/02/2024 1000    BUN 14 04/02/2024 1000   BUN 13 02/02/2015 0000   CREATININE 0.79 04/02/2024 1000   GLU 101 02/02/2015 0000      Component Value Date/Time   CALCIUM 9.9 04/02/2024 1000   ALKPHOS 55 04/02/2024 1000   AST 21 04/02/2024 1000   ALT 13 04/02/2024 1000  BILITOT 0.8 04/02/2024 1000       Impression and Plan: [name] is a very pleasant 74 yo caucasian postmenopausal female with locally advanced infiltrating ductal carcinoma of the right breast- ER+/PR+/HER2- --- Oncotype =30.  She received neoadjuvant chemotherapy.  She then underwent lumpectomy.  She was found to have a stage IIA ductal carcinoma.  She had 2 positive lymph nodes.  She is undergoing radiation therapy right now.  I think that when she finishes radiation therapy, she would be a good candidate for aromatase inhibitor therapy along with CDK4/CDK6 therapy.  She really had a hard time with Verzenio  in the neoadjuvant setting.  As such, if we use a CDK 4/CDK 6 inhibitor in the adjuvant setting, we will have to make a change.  We will think about getting a Doppler of her right leg when we see her back.  It would be nice to see what the thrombus has responded to.  She is incredibly tough.  She has really done well with radiotherapy.  I would like to get her back probably in about 6 weeks.  This way, she would have healed up quite a bit from the radiotherapy.  SABRA   Maude JONELLE Crease, MD 1/15/202611:58 AM    "

## 2024-05-01 ENCOUNTER — Ambulatory Visit
Admission: RE | Admit: 2024-05-01 | Discharge: 2024-05-01 | Disposition: A | Discharge status: Home / Self Care | Source: Ambulatory Visit | Attending: Radiation Oncology | Admitting: Radiation Oncology

## 2024-05-01 ENCOUNTER — Other Ambulatory Visit: Payer: Self-pay

## 2024-05-01 LAB — RAD ONC ARIA SESSION SUMMARY
Course Elapsed Days: 38
Plan Fractions Treated to Date: 26
Plan Fractions Treated to Date: 26
Plan Prescribed Dose Per Fraction: 1.8 Gy
Plan Prescribed Dose Per Fraction: 1.8 Gy
Plan Total Fractions Prescribed: 28
Plan Total Fractions Prescribed: 28
Plan Total Prescribed Dose: 50.4 Gy
Plan Total Prescribed Dose: 50.4 Gy
Reference Point Dosage Given to Date: 46.8 Gy
Reference Point Dosage Given to Date: 46.8 Gy
Reference Point Session Dosage Given: 1.8 Gy
Reference Point Session Dosage Given: 1.8 Gy
Session Number: 26

## 2024-05-01 LAB — CANCER ANTIGEN 27.29: CA 27.29: 20.8 U/mL (ref 0.0–38.6)

## 2024-05-04 ENCOUNTER — Other Ambulatory Visit: Payer: Self-pay

## 2024-05-04 ENCOUNTER — Ambulatory Visit
Admission: RE | Admit: 2024-05-04 | Discharge: 2024-05-04 | Disposition: A | Source: Ambulatory Visit | Attending: Radiation Oncology | Admitting: Radiation Oncology

## 2024-05-04 LAB — RAD ONC ARIA SESSION SUMMARY
Course Elapsed Days: 41
Plan Fractions Treated to Date: 27
Plan Fractions Treated to Date: 27
Plan Prescribed Dose Per Fraction: 1.8 Gy
Plan Prescribed Dose Per Fraction: 1.8 Gy
Plan Total Fractions Prescribed: 28
Plan Total Fractions Prescribed: 28
Plan Total Prescribed Dose: 50.4 Gy
Plan Total Prescribed Dose: 50.4 Gy
Reference Point Dosage Given to Date: 48.6 Gy
Reference Point Dosage Given to Date: 48.6 Gy
Reference Point Session Dosage Given: 1.8 Gy
Reference Point Session Dosage Given: 1.8 Gy
Session Number: 27

## 2024-05-05 ENCOUNTER — Ambulatory Visit
Admission: RE | Admit: 2024-05-05 | Discharge: 2024-05-05 | Disposition: A | Source: Ambulatory Visit | Attending: Radiation Oncology | Admitting: Radiation Oncology

## 2024-05-05 ENCOUNTER — Other Ambulatory Visit: Payer: Self-pay

## 2024-05-05 DIAGNOSIS — C50911 Malignant neoplasm of unspecified site of right female breast: Secondary | ICD-10-CM

## 2024-05-05 LAB — RAD ONC ARIA SESSION SUMMARY
Course Elapsed Days: 42
Plan Fractions Treated to Date: 28
Plan Fractions Treated to Date: 28
Plan Prescribed Dose Per Fraction: 1.8 Gy
Plan Prescribed Dose Per Fraction: 1.8 Gy
Plan Total Fractions Prescribed: 28
Plan Total Fractions Prescribed: 28
Plan Total Prescribed Dose: 50.4 Gy
Plan Total Prescribed Dose: 50.4 Gy
Reference Point Dosage Given to Date: 50.4 Gy
Reference Point Dosage Given to Date: 50.4 Gy
Reference Point Session Dosage Given: 1.8 Gy
Reference Point Session Dosage Given: 1.8 Gy
Session Number: 28

## 2024-05-05 MED ORDER — RADIAPLEXRX EX GEL: Freq: Once | CUTANEOUS | Status: AC

## 2024-05-06 ENCOUNTER — Other Ambulatory Visit: Payer: Self-pay

## 2024-05-06 ENCOUNTER — Ambulatory Visit
Admission: RE | Admit: 2024-05-06 | Discharge: 2024-05-06 | Disposition: A | Source: Ambulatory Visit | Attending: Radiation Oncology | Admitting: Radiation Oncology

## 2024-05-06 LAB — RAD ONC ARIA SESSION SUMMARY
Course Elapsed Days: 43
Plan Fractions Treated to Date: 1
Plan Prescribed Dose Per Fraction: 2 Gy
Plan Total Fractions Prescribed: 6
Plan Total Prescribed Dose: 12 Gy
Reference Point Dosage Given to Date: 2 Gy
Reference Point Session Dosage Given: 2 Gy
Session Number: 29

## 2024-05-07 ENCOUNTER — Ambulatory Visit
Admission: RE | Admit: 2024-05-07 | Discharge: 2024-05-07 | Disposition: A | Discharge status: Home / Self Care | Source: Ambulatory Visit | Attending: Radiation Oncology | Admitting: Radiation Oncology

## 2024-05-07 ENCOUNTER — Other Ambulatory Visit: Payer: Self-pay

## 2024-05-07 LAB — RAD ONC ARIA SESSION SUMMARY
Course Elapsed Days: 44
Plan Fractions Treated to Date: 2
Plan Prescribed Dose Per Fraction: 2 Gy
Plan Total Fractions Prescribed: 6
Plan Total Prescribed Dose: 12 Gy
Reference Point Dosage Given to Date: 4 Gy
Reference Point Session Dosage Given: 2 Gy
Session Number: 30

## 2024-05-08 ENCOUNTER — Other Ambulatory Visit: Payer: Self-pay

## 2024-05-08 ENCOUNTER — Ambulatory Visit
Admission: RE | Admit: 2024-05-08 | Discharge: 2024-05-08 | Disposition: A | Source: Ambulatory Visit | Attending: Radiation Oncology | Admitting: Radiation Oncology

## 2024-05-08 LAB — RAD ONC ARIA SESSION SUMMARY
Course Elapsed Days: 45
Plan Fractions Treated to Date: 3
Plan Prescribed Dose Per Fraction: 2 Gy
Plan Total Fractions Prescribed: 6
Plan Total Prescribed Dose: 12 Gy
Reference Point Dosage Given to Date: 6 Gy
Reference Point Session Dosage Given: 2 Gy
Session Number: 31

## 2024-05-11 ENCOUNTER — Ambulatory Visit

## 2024-05-12 ENCOUNTER — Ambulatory Visit
Admission: RE | Admit: 2024-05-12 | Discharge: 2024-05-12 | Attending: Radiation Oncology | Admitting: Radiation Oncology

## 2024-05-12 ENCOUNTER — Other Ambulatory Visit: Payer: Self-pay

## 2024-05-12 ENCOUNTER — Ambulatory Visit
Admission: RE | Admit: 2024-05-12 | Discharge: 2024-05-12 | Disposition: A | Source: Ambulatory Visit | Attending: Radiation Oncology | Admitting: Radiation Oncology

## 2024-05-12 ENCOUNTER — Ambulatory Visit

## 2024-05-12 LAB — RAD ONC ARIA SESSION SUMMARY
Course Elapsed Days: 49
Plan Fractions Treated to Date: 4
Plan Prescribed Dose Per Fraction: 2 Gy
Plan Total Fractions Prescribed: 6
Plan Total Prescribed Dose: 12 Gy
Reference Point Dosage Given to Date: 8 Gy
Reference Point Session Dosage Given: 2 Gy
Session Number: 32

## 2024-05-13 ENCOUNTER — Ambulatory Visit
Admission: RE | Admit: 2024-05-13 | Discharge: 2024-05-13 | Attending: Radiation Oncology | Admitting: Radiation Oncology

## 2024-05-13 ENCOUNTER — Ambulatory Visit

## 2024-05-13 ENCOUNTER — Other Ambulatory Visit: Payer: Self-pay

## 2024-05-13 LAB — RAD ONC ARIA SESSION SUMMARY
Course Elapsed Days: 50
Plan Fractions Treated to Date: 5
Plan Prescribed Dose Per Fraction: 2 Gy
Plan Total Fractions Prescribed: 6
Plan Total Prescribed Dose: 12 Gy
Reference Point Dosage Given to Date: 10 Gy
Reference Point Session Dosage Given: 2 Gy
Session Number: 33

## 2024-05-14 ENCOUNTER — Ambulatory Visit
Admission: RE | Admit: 2024-05-14 | Discharge: 2024-05-14 | Disposition: A | Discharge status: Home / Self Care | Source: Ambulatory Visit | Attending: Radiation Oncology | Admitting: Radiation Oncology

## 2024-05-14 ENCOUNTER — Other Ambulatory Visit: Payer: Self-pay

## 2024-05-14 LAB — RAD ONC ARIA SESSION SUMMARY
Course Elapsed Days: 51
Plan Fractions Treated to Date: 6
Plan Prescribed Dose Per Fraction: 2 Gy
Plan Total Fractions Prescribed: 6
Plan Total Prescribed Dose: 12 Gy
Reference Point Dosage Given to Date: 12 Gy
Reference Point Session Dosage Given: 2 Gy
Session Number: 34

## 2024-05-15 NOTE — Radiation Completion Notes (Signed)
 Patient Name: [name], [name] MRN: [id number] Date of Birth: [birth year] Referring Physician: DONNICE BURY, M.D. Date of Service: 2024-05-15 Radiation Oncologist: Signe Nasuti, M.D. 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, N=cN1, M=cM0 Intent: Curative     ==========DELIVERED PLANS==========  First Treatment Date: 2024-03-24 Last Treatment Date: 2024-05-14   Plan Name: Breast_R Site: Breast, Right Technique: 3D Mode: Photon Dose Per Fraction: 1.8 Gy Prescribed Dose (Delivered / Prescribed): 50.4 Gy / 50.4 Gy Prescribed Fxs (Delivered / Prescribed): 28 / 28   Plan Name: Breast_R_ScvP Site: Breast, Right Technique: 3D Mode: Photon Dose Per Fraction: 1.8 Gy Prescribed Dose (Delivered / Prescribed): 50.4 Gy / 50.4 Gy Prescribed Fxs (Delivered / Prescribed): 28 / 28   Plan Name: Breast_R_Bst Site: Breast, Right Technique: Electron Mode: Electron Dose Per Fraction: 2 Gy Prescribed Dose (Delivered / Prescribed): 12 Gy / 12 Gy Prescribed Fxs (Delivered / Prescribed): 6 / 6     ==========ON TREATMENT VISIT DATES========== 2024-03-24, 2024-04-01, 2024-04-07, 2024-04-14, 2024-04-21, 2024-04-28, 2024-05-05, 2024-05-12     ==========UPCOMING VISITS========== 06/09/2024 [facility] FOLLOW UP 15 Wyatt Leeroy [place], [place]  06/02/2024 [facility] ANNUAL WELL VISIT, SEQUENTIAL Luke Chiquita SAUNDERS, DO  06/01/2024 [facility]OUND US  VENOUS IMAG BI/LT/RT-60 [facility]  1  06/01/2024 [facility] EST PT 45 Timmy Maude SAUNDERS, MD  06/01/2024 [facility]        ==========APPENDIX - ON TREATMENT VISIT NOTES==========   See weekly On Treatment Notes in Epic for details in the Media tab (listed as Progress  notes on the On Treatment Visit Dates listed above).

## 2024-06-01 ENCOUNTER — Inpatient Hospital Stay: Admitting: Hematology & Oncology

## 2024-06-01 ENCOUNTER — Inpatient Hospital Stay

## 2024-06-01 ENCOUNTER — Ambulatory Visit (HOSPITAL_BASED_OUTPATIENT_CLINIC_OR_DEPARTMENT_OTHER)

## 2024-06-02 ENCOUNTER — Ambulatory Visit: Admitting: Family Medicine

## 2024-06-09 ENCOUNTER — Ambulatory Visit: Admitting: Radiology
